# Patient Record
Sex: Female | Born: 1950 | Race: White | Hispanic: No | Smoking: Never smoker
Health system: Southern US, Community
[De-identification: ages and names within clinical notes are randomized; demographics above are authoritative.]

## PROBLEM LIST (undated history)

## (undated) DIAGNOSIS — K219 Gastro-esophageal reflux disease without esophagitis: Secondary | ICD-10-CM

## (undated) DIAGNOSIS — E663 Overweight: Secondary | ICD-10-CM

## (undated) DIAGNOSIS — R32 Unspecified urinary incontinence: Secondary | ICD-10-CM

## (undated) DIAGNOSIS — M1990 Unspecified osteoarthritis, unspecified site: Secondary | ICD-10-CM

## (undated) DIAGNOSIS — R0989 Other specified symptoms and signs involving the circulatory and respiratory systems: Secondary | ICD-10-CM

## (undated) DIAGNOSIS — R0609 Other forms of dyspnea: Secondary | ICD-10-CM

## (undated) DIAGNOSIS — G4733 Obstructive sleep apnea (adult) (pediatric): Secondary | ICD-10-CM

## (undated) DIAGNOSIS — J309 Allergic rhinitis, unspecified: Secondary | ICD-10-CM

## (undated) DIAGNOSIS — J45909 Unspecified asthma, uncomplicated: Secondary | ICD-10-CM

## (undated) DIAGNOSIS — E785 Hyperlipidemia, unspecified: Secondary | ICD-10-CM

## (undated) HISTORY — PX: TUBAL LIGATION: SHX77

## (undated) HISTORY — DX: Allergic rhinitis, unspecified: J30.9

## (undated) HISTORY — DX: Hyperlipidemia, unspecified: E78.5

## (undated) HISTORY — DX: Gastro-esophageal reflux disease without esophagitis: K21.9

## (undated) HISTORY — DX: Other forms of dyspnea: R06.09

## (undated) HISTORY — DX: Unspecified asthma, uncomplicated: J45.909

## (undated) HISTORY — DX: Overweight: E66.3

## (undated) HISTORY — DX: Other specified symptoms and signs involving the circulatory and respiratory systems: R09.89

## (undated) HISTORY — DX: Obstructive sleep apnea (adult) (pediatric): G47.33

## (undated) HISTORY — PX: TOTAL KNEE ARTHROPLASTY: SHX125

## (undated) HISTORY — DX: Unspecified urinary incontinence: R32

## (undated) HISTORY — DX: Unspecified osteoarthritis, unspecified site: M19.90

---

## 2003-01-03 LAB — ABO/RH: ABO Rh Results: 0

## 2004-03-10 ENCOUNTER — Other Ambulatory Visit: Admission: RE | Admit: 2004-03-10 | Discharge: 2004-03-10 | Payer: Self-pay | Admitting: Obstetrics and Gynecology

## 2005-06-02 ENCOUNTER — Other Ambulatory Visit: Admission: RE | Admit: 2005-06-02 | Discharge: 2005-06-02 | Payer: Self-pay | Admitting: Obstetrics and Gynecology

## 2006-10-23 ENCOUNTER — Ambulatory Visit: Payer: Self-pay | Admitting: Pulmonary Disease

## 2006-11-14 HISTORY — PX: OTHER SURGICAL HISTORY: SHX169

## 2007-01-01 ENCOUNTER — Ambulatory Visit: Payer: Self-pay | Admitting: Pulmonary Disease

## 2007-01-23 ENCOUNTER — Ambulatory Visit: Payer: Self-pay | Admitting: Pulmonary Disease

## 2007-02-13 ENCOUNTER — Ambulatory Visit: Payer: Self-pay | Admitting: Pulmonary Disease

## 2007-07-13 ENCOUNTER — Inpatient Hospital Stay (HOSPITAL_COMMUNITY): Admission: EM | Admit: 2007-07-13 | Discharge: 2007-07-14 | Payer: Self-pay | Admitting: Emergency Medicine

## 2007-09-10 ENCOUNTER — Ambulatory Visit: Payer: Self-pay | Admitting: Pulmonary Disease

## 2007-09-17 ENCOUNTER — Ambulatory Visit (HOSPITAL_BASED_OUTPATIENT_CLINIC_OR_DEPARTMENT_OTHER): Admission: RE | Admit: 2007-09-17 | Discharge: 2007-09-17 | Payer: Self-pay | Admitting: Pulmonary Disease

## 2007-09-28 ENCOUNTER — Ambulatory Visit: Payer: Self-pay | Admitting: Pulmonary Disease

## 2007-10-15 DIAGNOSIS — J45909 Unspecified asthma, uncomplicated: Secondary | ICD-10-CM | POA: Insufficient documentation

## 2007-10-15 DIAGNOSIS — J4540 Moderate persistent asthma, uncomplicated: Secondary | ICD-10-CM | POA: Insufficient documentation

## 2007-10-15 DIAGNOSIS — R0989 Other specified symptoms and signs involving the circulatory and respiratory systems: Secondary | ICD-10-CM | POA: Insufficient documentation

## 2007-10-15 DIAGNOSIS — R0609 Other forms of dyspnea: Secondary | ICD-10-CM | POA: Insufficient documentation

## 2007-10-15 DIAGNOSIS — J4541 Moderate persistent asthma with (acute) exacerbation: Secondary | ICD-10-CM | POA: Insufficient documentation

## 2007-10-23 ENCOUNTER — Ambulatory Visit: Payer: Self-pay | Admitting: Pulmonary Disease

## 2007-10-23 DIAGNOSIS — G4733 Obstructive sleep apnea (adult) (pediatric): Secondary | ICD-10-CM | POA: Insufficient documentation

## 2008-08-25 ENCOUNTER — Encounter: Payer: Self-pay | Admitting: Pulmonary Disease

## 2008-10-27 ENCOUNTER — Ambulatory Visit: Payer: Self-pay | Admitting: Pulmonary Disease

## 2008-10-28 ENCOUNTER — Telehealth (INDEPENDENT_AMBULATORY_CARE_PROVIDER_SITE_OTHER): Payer: Self-pay | Admitting: *Deleted

## 2009-01-15 ENCOUNTER — Ambulatory Visit: Payer: Self-pay | Admitting: Internal Medicine

## 2009-02-18 ENCOUNTER — Ambulatory Visit: Payer: Self-pay | Admitting: Internal Medicine

## 2009-04-01 ENCOUNTER — Ambulatory Visit: Payer: Self-pay | Admitting: Internal Medicine

## 2009-04-14 ENCOUNTER — Ambulatory Visit: Payer: Self-pay | Admitting: Pulmonary Disease

## 2009-04-14 DIAGNOSIS — J309 Allergic rhinitis, unspecified: Secondary | ICD-10-CM | POA: Insufficient documentation

## 2009-04-14 DIAGNOSIS — K219 Gastro-esophageal reflux disease without esophagitis: Secondary | ICD-10-CM | POA: Insufficient documentation

## 2009-04-14 DIAGNOSIS — E785 Hyperlipidemia, unspecified: Secondary | ICD-10-CM | POA: Insufficient documentation

## 2009-04-14 DIAGNOSIS — R7309 Other abnormal glucose: Secondary | ICD-10-CM | POA: Insufficient documentation

## 2009-04-14 DIAGNOSIS — E1169 Type 2 diabetes mellitus with other specified complication: Secondary | ICD-10-CM | POA: Insufficient documentation

## 2009-04-15 DIAGNOSIS — R32 Unspecified urinary incontinence: Secondary | ICD-10-CM | POA: Insufficient documentation

## 2009-04-15 DIAGNOSIS — M1990 Unspecified osteoarthritis, unspecified site: Secondary | ICD-10-CM | POA: Insufficient documentation

## 2009-06-10 ENCOUNTER — Telehealth: Payer: Self-pay | Admitting: Pulmonary Disease

## 2009-06-30 ENCOUNTER — Telehealth: Payer: Self-pay | Admitting: Pulmonary Disease

## 2009-10-16 ENCOUNTER — Encounter: Admission: RE | Admit: 2009-10-16 | Discharge: 2009-10-16 | Payer: Self-pay | Admitting: Obstetrics and Gynecology

## 2010-01-11 ENCOUNTER — Ambulatory Visit: Payer: Self-pay | Admitting: Pulmonary Disease

## 2010-01-11 DIAGNOSIS — J019 Acute sinusitis, unspecified: Secondary | ICD-10-CM | POA: Insufficient documentation

## 2010-04-29 ENCOUNTER — Ambulatory Visit: Payer: Self-pay | Admitting: Pulmonary Disease

## 2010-05-01 DIAGNOSIS — E663 Overweight: Secondary | ICD-10-CM | POA: Insufficient documentation

## 2010-05-11 ENCOUNTER — Telehealth (INDEPENDENT_AMBULATORY_CARE_PROVIDER_SITE_OTHER): Payer: Self-pay | Admitting: *Deleted

## 2010-05-27 LAB — CONVERTED CEMR LAB
ALT: 25 units/L (ref 0–35)
AST: 19 units/L (ref 0–37)
Albumin: 4.1 g/dL (ref 3.5–5.2)
Alkaline Phosphatase: 58 units/L (ref 39–117)
Basophils Absolute: 0.1 10*3/uL (ref 0.0–0.1)
CO2: 28 meq/L (ref 19–32)
Chloride: 110 meq/L (ref 96–112)
Cholesterol: 228 mg/dL — ABNORMAL HIGH (ref 0–200)
Glucose, Bld: 119 mg/dL — ABNORMAL HIGH (ref 70–99)
HCT: 39.8 % (ref 36.0–46.0)
Lymphocytes Relative: 39.2 % (ref 12.0–46.0)
Lymphs Abs: 2.4 10*3/uL (ref 0.7–4.0)
Monocytes Relative: 7.5 % (ref 3.0–12.0)
Neutrophils Relative %: 47.1 % (ref 43.0–77.0)
Platelets: 281 10*3/uL (ref 150.0–400.0)
Potassium: 4.3 meq/L (ref 3.5–5.1)
RDW: 13.8 % (ref 11.5–14.6)
Sodium: 144 meq/L (ref 135–145)
Total Protein: 6.7 g/dL (ref 6.0–8.3)
VLDL: 43.2 mg/dL — ABNORMAL HIGH (ref 0.0–40.0)

## 2010-07-07 ENCOUNTER — Ambulatory Visit: Payer: Self-pay | Admitting: Pulmonary Disease

## 2010-07-07 DIAGNOSIS — J209 Acute bronchitis, unspecified: Secondary | ICD-10-CM | POA: Insufficient documentation

## 2010-07-12 ENCOUNTER — Telehealth (INDEPENDENT_AMBULATORY_CARE_PROVIDER_SITE_OTHER): Payer: Self-pay | Admitting: *Deleted

## 2010-12-08 ENCOUNTER — Encounter: Payer: Self-pay | Admitting: Pulmonary Disease

## 2010-12-12 LAB — CONVERTED CEMR LAB
AST: 25 units/L (ref 0–37)
Albumin: 4.2 g/dL (ref 3.5–5.2)
BUN: 7 mg/dL (ref 6–23)
Basophils Absolute: 0.1 10*3/uL (ref 0.0–0.1)
CO2: 30 meq/L (ref 19–32)
Calcium: 10.3 mg/dL (ref 8.4–10.5)
Creatinine, Ser: 0.7 mg/dL (ref 0.4–1.2)
Eosinophils Relative: 2.8 % (ref 0.0–5.0)
GFR calc non Af Amer: 90.82 mL/min (ref >60)
Glucose, Bld: 100 mg/dL — ABNORMAL HIGH (ref 70–99)
HDL: 43.2 mg/dL (ref >39.00)
Hgb A1c MFr Bld: 5.8 % (ref 4.6–6.5)
Lymphocytes Relative: 34.9 % (ref 12.0–46.0)
Lymphs Abs: 2.2 10*3/uL (ref 0.7–4.0)
MCV: 86.8 fL (ref 78.0–100.0)
Monocytes Absolute: 0.5 10*3/uL (ref 0.1–1.0)
Monocytes Relative: 7.8 % (ref 3.0–12.0)
Neutro Abs: 3.3 10*3/uL (ref 1.4–7.7)
Neutrophils Relative %: 53.6 % (ref 43.0–77.0)
Nitrite: NEGATIVE
Potassium: 4.1 meq/L (ref 3.5–5.1)
RBC: 4.89 M/uL (ref 3.87–5.11)
Sodium: 143 meq/L (ref 135–145)
Total Bilirubin: 1 mg/dL (ref 0.3–1.2)
Total Protein, Urine: 30 mg/dL
Total Protein: 7.4 g/dL (ref 6.0–8.3)
Triglycerides: 141 mg/dL (ref 0.0–149.0)
Urine Glucose: NEGATIVE mg/dL
Urobilinogen, UA: 0.2 (ref 0.0–1.0)
VLDL: 28.2 mg/dL (ref 0.0–40.0)

## 2010-12-16 ENCOUNTER — Ambulatory Visit (INDEPENDENT_AMBULATORY_CARE_PROVIDER_SITE_OTHER): Payer: BC Managed Care – PPO | Admitting: Adult Health

## 2010-12-16 ENCOUNTER — Encounter: Payer: Self-pay | Admitting: Adult Health

## 2010-12-16 DIAGNOSIS — J309 Allergic rhinitis, unspecified: Secondary | ICD-10-CM

## 2010-12-16 DIAGNOSIS — J209 Acute bronchitis, unspecified: Secondary | ICD-10-CM

## 2010-12-16 NOTE — Assessment & Plan Note (Signed)
Summary: Acute NP office visit - asthma   Copy to:  Kriste Basque Primary Provider/Referring Provider:  Kriste Basque  CC:  increased SOB, wheezing, prod cough with yellow mucus, sinus pressure/congestion with brownish drainage, and PND x2weeks - denies f/c/s.  History of Present Illness: January 15, 2009--Presents for acute office viist. Complains of cough for 3 days (clear)  -Started after working out - [employer]- occas runny nose - occas chest tightness. Allergy symtpoms have been worse over last 6 months. Now has daily watery eyes despite using zyrtec once daily. Denies chest pain, dyspnea, orthopnea, hemoptysis, fever, n/v/d, edema, purulent sputum. Wants an allergy referral . Prev. tested >10 years ago. Neighbor"s cat bothers her breathing. New house on slab, gas heat, small dog, encasings on bedding, not dusty or moldy, husband smokes outside.  02/18/09-NEW ALLERGY CONSULT for Dr Nadel/ NP Parett for this 60 yoF. She c/o daily perennial watering and itching eyes watery nose, mild wheezing. Ears itch. Wheezing started in past year. Hives in past treated with prednisone. denies problems with foods, insects or cosmetics, latex, contrast or aspirin. Previous allergy testing by neighbor Dr Willa Rough, without vaccine suggested. No ENT surgery. Hx sleep apnea- trying to lose weight before CPAP.  04/01/09- Allergic rhinitis, hx OSA Right frontal HA off antihistamine for skin testing today.  Skin test- strong positive  grass, weed, tree, dust/ mite  January 11, 2010--Presents for an acute office visit. Complains of increased SOB, wheezing, prod cough with yellow mucus, sinus pressure/congestion with brownish drainage, PND x2weeks, .OTC not helping. Denies chest pain, dyspnea, orthopnea, hemoptysis, fever, n/v/d, edema, headache.   Medications Prior to Update: 1)  Zyrtec Allergy 10 Mg  Tabs (Cetirizine Hcl) .... Use As Needed 2)  Astepro 0.15 % Soln (Azelastine Hcl) .... 2 Puffs At Bedtime 3)  Mucinex Dm 30-600 Mg  Xr12h-Tab (Dextromethorphan-Guaifenesin) .Marland Kitchen.. 1-2 Every 12 Hours As Needed For Cough 4)  Symbicort 80-4.5 Mcg/act Aero (Budesonide-Formoterol Fumarate) .... Inhale 2 Puffs Two Times A Day 5)  Tylenol Arthritis Pain 650 Mg  Tbcr (Acetaminophen) .... Take By Mouth As Needed 6)  Multivitamins   Tabs (Multiple Vitamin) .... Take 1 Tablet By Mouth Once A Day 7)  Aspirin 81 Mg Tbec (Aspirin) .... Take 1 Tab By Mouth Once Daily.Marland KitchenMarland Kitchen 8)  Vitamin D 1000 Unit Tabs (Cholecalciferol) .... Take 1 Tablet By Mouth Once A Day  Current Medications (verified): 1)  Zyrtec Allergy 10 Mg  Tabs (Cetirizine Hcl) .... Use As Needed 2)  Astepro 0.15 % Soln (Azelastine Hcl) .... 2 Puffs At Bedtime 3)  Mucinex Dm 30-600 Mg Xr12h-Tab (Dextromethorphan-Guaifenesin) .Marland Kitchen.. 1-2 Every 12 Hours As Needed For Cough 4)  Symbicort 80-4.5 Mcg/act Aero (Budesonide-Formoterol Fumarate) .... Inhale 2 Puffs Two Times A Day 5)  Tylenol Arthritis Pain 650 Mg  Tbcr (Acetaminophen) .... Take By Mouth As Needed 6)  Multivitamins   Tabs (Multiple Vitamin) .... Take 1 Tablet By Mouth Once A Day 7)  Aspirin 81 Mg Tbec (Aspirin) .... Take 1 Tab By Mouth Once Daily.Marland KitchenMarland Kitchen 8)  Vitamin D 1000 Unit Tabs (Cholecalciferol) .... Take 1 Tablet By Mouth Once A Day 9)  Citracal Plus  Tabs (Multiple Minerals-Vitamins) .... Take 1 Tablet By Mouth Once A Day  Allergies (verified): No Known Drug Allergies  Past History:  Past Medical History: Last updated: 04/14/2009 ALLERGIC RHINITIS (ICD-477.9) SLEEP APNEA, OBSTRUCTIVE, MODERATE (ICD-327.23) SNORING (ICD-786.09) ASTHMA (ICD-493.90) HYPERCHOLESTEROLEMIA, BORDERLINE (ICD-272.4) OTHER ABNORMAL GLUCOSE (ICD-790.29) GASTROESOPHAGEAL REFLUX DISEASE (ICD-530.81) URINARY INCONTINENCE (ICD-788.30) DEGENERATIVE JOINT DISEASE (ICD-715.90)  Past Surgical History: Last updated: 04/19/2009 Partial knee replacement Fracture wrist w/ surg DrMyerdierks 8/08 Tubal ligation  Family History: Last updated:  2009/04/19 Father died age 57 w/ DM  & heart disease. Mother alive age 3 in nursing home w/ stroke, arthritis. 2 Siblings: 1 Brother in good health 1 Sister in good health  Social History: Last updated: 2009/04/19 Married, husb= Casimiro Needle, 37 yrs 2 children in good health never smoked social alcohol Work [employer], buisness owner--tags/reg.   Risk Factors: Smoking Status: never (02/18/2009)  Review of Systems      See HPI  Vital Signs:  Patient profile:   60 year old female Height:      67 inches Weight:      209.50 pounds BMI:     32.93 O2 Sat:      94 % on Room air Temp:     99.0 degrees F oral Pulse rate:   68 / minute BP sitting:   124 / 76  (left arm) Cuff size:   regular  Vitals Entered By: Boone Master CNA (January 11, 2010 4:26 PM)  O2 Flow:  Room air CC: increased SOB, wheezing, prod cough with yellow mucus, sinus pressure/congestion with brownish drainage, PND x2weeks - denies f/c/s Is Patient Diabetic? No Comments Medications reviewed with patient Daytime contact number verified with patient. Boone Master CNA  January 11, 2010 4:26 PM    Physical Exam  Additional Exam:  General: A/Ox3; pleasant and cooperative, NAD, stocky build SKIN: no rash, lesions NODES: no lymphadenopathy HEENT: [place]/AT, EOM- WNL, Conjuctivae- clear, PERRLA, TM-WNL, Nose- clear, edematous, Throat- clear and wnl, Melampatti III-IV NECK: Supple w/ fair ROM, JVD- none, normal carotid impulses w/o bruits Thyroid- normal to palpation CHEST: Clear to P&A HEART: RRR, no m/g/r heard ABDOMEN BTD:VVOH, nl pulses, no edema  NEURO: Grossly intact to observation      Impression & Recommendations:  Problem # 1:  SINUSITIS, ACUTE (ICD-461.9)  Omnicef 300mg  two times a day for 10 days Mucinex DM two times a day as needed cough /congestion  Increase fluids  Saline nasal rinses as needed  Please contact office for sooner follow up if symptoms do not improve or worsen  follow up Dr. Kriste Basque  for CPX in 3 months  Her updated medication list for this problem includes:    Astepro 0.15 % Soln (Azelastine hcl) .Marland Kitchen... 2 puffs at bedtime    Mucinex Dm 30-600 Mg Xr12h-tab (Dextromethorphan-guaifenesin) .Marland Kitchen... 1-2 every 12 hours as needed for cough    Cefdinir 300 Mg Caps (Cefdinir) .Marland Kitchen... 1 by mouth two times a day  Orders: Est. Patient Level IV (60737)  Medications Added to Medication List This Visit: 1)  Citracal Plus Tabs (Multiple minerals-vitamins) .... Take 1 tablet by mouth once a day 2)  Cefdinir 300 Mg Caps (Cefdinir) .Marland Kitchen.. 1 by mouth two times a day  Complete Medication List: 1)  Zyrtec Allergy 10 Mg Tabs (Cetirizine hcl) .... Use as needed 2)  Astepro 0.15 % Soln (Azelastine hcl) .... 2 puffs at bedtime 3)  Mucinex Dm 30-600 Mg Xr12h-tab (Dextromethorphan-guaifenesin) .Marland Kitchen.. 1-2 every 12 hours as needed for cough 4)  Symbicort 80-4.5 Mcg/act Aero (Budesonide-formoterol fumarate) .... Inhale 2 puffs two times a day 5)  Tylenol Arthritis Pain 650 Mg Tbcr (Acetaminophen) .... Take by mouth as needed 6)  Multivitamins Tabs (Multiple vitamin) .... Take 1 tablet by mouth once a day 7)  Aspirin 81 Mg Tbec (Aspirin) .... Take 1 tab by mouth once daily.Marland KitchenMarland Kitchen 8)  Vitamin  D 1000 Unit Tabs (Cholecalciferol) .... Take 1 tablet by mouth once a day 9)  Citracal Plus Tabs (Multiple minerals-vitamins) .... Take 1 tablet by mouth once a day 10)  Cefdinir 300 Mg Caps (Cefdinir) .Marland Kitchen.. 1 by mouth two times a day  Patient Instructions: 1)  Omnicef 300mg  two times a day for 10 days 2)  Mucinex DM two times a day as needed cough /congestion  3)  Increase fluids  4)  Saline nasal rinses as needed  5)  Please contact office for sooner follow up if symptoms do not improve or worsen  6)  follow up Dr. Kriste Basque for CPX in 3 months  Prescriptions: CEFDINIR 300 MG CAPS (CEFDINIR) 1 by mouth two times a day  #20 x 0   Entered and Authorized by:   Rubye Oaks NP   Signed by:   Rubye Oaks NP on 01/11/2010    Method used:   Electronically to        [id number]* (retail)       [address] [place], [place]  [address]       Ph: [phone]       Fax: [phone]   RxID:   [id number]    Immunization History:  Influenza Immunization History:    Influenza:  historical (08/14/2009)   Appended Document: neb documentation    Clinical Lists Changes  Orders: Added new Service order of Nebulizer Tx ([id number]) - Signed

## 2010-12-16 NOTE — Progress Notes (Signed)
Summary: still sick -LMTCB x 1  Phone Note Call from Patient   Caller: Patient Call For: tammy parrett Summary of Call: pt saw tp on 8/24. she is following all recs but does not feel any better. cell [phone] Initial call taken by: Tivis Ringer, CNA,  July 12, 2010 9:13 AM  Follow-up for Phone Call        pt states she is no better from ov still coughing-prod-yellow and very thick, throat sore and glands in neck are swollen very fatigued, no energy, pt denies fever, chills and  sweats. pt states she is usually better after 3-4 days on antibiotic but dos not feel that is the case this time--pls advise Follow-up by: Philipp Deputy CMA,  July 12, 2010 10:13 AM  Additional Follow-up for Phone Call Additional follow up Details #1::        probably needs more time.  saline nasal rinses.  may have cough syrup --Hydromet 1-2 tsp every 4-6 hr as needed cough-may make you sleepy.  Prednisone taper over next week 10mg  4 tabs for 2 days, then 3 tabs for 2 days, 2 tabs for 2 days, then 1 tab for 2 days, then stop #20 no refills Please contact office for sooner follow up if symptoms do not improve or worsen   Additional Follow-up by: Rubye Oaks NP,  July 12, 2010 10:18 AM    Additional Follow-up for Phone Call Additional follow up Details #2::    LMOMTCB Vernie Murders  July 12, 2010 10:30 AM  Spoke with pt and notified of the above recs per TP.  Pt verbalized understandind and denied any questions.  Rxs sent to [facility] pharm per pt request. Follow-up by: Vernie Murders,  July 12, 2010 1:04 PM  New/Updated Medications: HYDROMET 5-1.5 MG/5ML SYRP (HYDROCODONE-HOMATROPINE) 1 to 2 tsp every 4-6 hours as needed PREDNISONE 10 MG TABS (PREDNISONE) 4 each am x 2 days, 3 each am x 2 days, 2 each am x 2 days, 1 each am x 2 days, then stop Prescriptions: PREDNISONE 10 MG TABS (PREDNISONE) 4 each am x 2 days, 3 each am x 2 days, 2 each am x 2 days, 1 each am x 2 days, then stop  #20 x 0  Entered by:   Vernie Murders   Authorized by:   Rubye Oaks NP   Signed by:   Vernie Murders on 07/12/2010   Method used:   Telephoned to ...       [facility] [phone]* (retail)       [address] [place], [place]  [address]       Ph: [phone]       Fax: [phone]   RxID:   [id number] HYDROMET 5-1.5 MG/5ML SYRP (HYDROCODONE-HOMATROPINE) 1 to 2 tsp every 4-6 hours as needed  #4 oz x 0   Entered by:   Vernie Murders   Authorized by:   Rubye Oaks NP   Signed by:   Vernie Murders on 07/12/2010   Method used:   Telephoned to ...       [facility] [phone]* (retail)       [address] [place], [place]  [address]       Ph: [phone]       Fax: [phone]   RxID:   [id number]

## 2010-12-16 NOTE — Assessment & Plan Note (Signed)
Summary: Acute NP office visit - bronchitis   Copy to:  Kriste Basque Primary Provider/Referring Provider:  Kriste Basque  CC:  prod cough with thick yellow mucus, sore throat, PND w/ hoarseness x1.5weeks - denies f/c/s, SOB, and wheezing.  History of Present Illness: January 15, 2009--Presents for acute office viist. Complains of cough for 3 days (clear)  -Started after working out - [employer]- occas runny nose - occas chest tightness. Allergy symtpoms have been worse over last 6 months. Now has daily watery eyes despite using zyrtec once daily.   Prev. tested >10 years ago. Neighbor"s cat bothers her breathing. New house on slab, gas heat, small dog, encasings on bedding, not dusty or moldy, husband smokes outside.  02/18/09-NEW ALLERGY CONSULT for Dr Nadel/ NP Parett for this 83 yoF. She c/o daily perennial watering and itching eyes watery nose, mild wheezing. Ears itch. Wheezing started in past year. Hives in past treated with prednisone. denies problems with foods, insects or cosmetics, latex, contrast or aspirin. Previous allergy testing by neighbor Dr Willa Rough, without vaccine suggested. No ENT surgery. Hx sleep apnea- trying to lose weight before CPAP.  04/01/09- Allergic rhinitis, hx OSA Right frontal HA off antihistamine for skin testing today. Skin test- strong positive  grass, weed, tree, dust/ mite  January 11, 2010--Presents for an acute office visit. Complains of increased SOB, wheezing, prod cough with yellow mucus, sinus pressure/congestion with brownish drainage, PND x2weeks, .OTC not helping.    ~  April 29, 2010:  she notes intermittent chest tightness esp if she doesn't take her Zyrtek/ Mucinex> we discussed her asthma & the need for Symbicort... she's only had one infect exac- 2/11 Rx w/ Omnicef... she has ?overactive bladder symtoms & we decided to try Toviaz Rx, DrToblin has been considering sling surg, she says... she has gained 10# in the last yr & we discussed diet + exercise  needed...  July 07, 2010--Presents for an acute office visit. Complains of prod cough with thick yellow mucus, sore throat, PND w/ hoarseness x1.5weeks. OTC not helping. Cough is getting worse with very thick mucus. Coughing up lots of mucus. Initially started with drainage, sore throat, hoarseness. Leaving to vacation to [place] next week. Denies chest pain, dyspnea, orthopnea, hemoptysis, fever, n/v/d, edema, headache,recent travel or antibiotifcs.   Medications Prior to Update: 1)  Zyrtec Allergy 10 Mg  Tabs (Cetirizine Hcl) .... Use As Needed 2)  Astepro 0.15 % Soln (Azelastine Hcl) .... 2 Puffs At Bedtime 3)  Mucinex Dm 30-600 Mg Xr12h-Tab (Dextromethorphan-Guaifenesin) .Marland Kitchen.. 1-2 Every 12 Hours As Needed For Cough 4)  Symbicort 80-4.5 Mcg/act Aero (Budesonide-Formoterol Fumarate) .... Inhale 2 Puffs Two Times A Day 5)  Aspirin 81 Mg Tbec (Aspirin) .... Take 1 Tab By Mouth Once Daily.Marland KitchenMarland Kitchen 6)  Multivitamins   Tabs (Multiple Vitamin) .... Take 1 Tablet By Mouth Once A Day 7)  Vitamin D 1000 Unit Tabs (Cholecalciferol) .... Take 1 Tablet By Mouth Once A Day 8)  Citracal Plus  Tabs (Multiple Minerals-Vitamins) .... Take 1 Tablet By Mouth Once A Day 9)  Toviaz 8 Mg Xr24h-Tab (Fesoterodine Fumarate) .... Take 1 Tab By Mouth Once Daily For Overactive Bladder Symptoms... 10)  Cefdinir 300 Mg Caps (Cefdinir) .... Take 2 Capsules By Mouth Once Daily X 7 Days  Current Medications (verified): 1)  Zyrtec Allergy 10 Mg  Tabs (Cetirizine Hcl) .... Use As Needed 2)  Astepro 0.15 % Soln (Azelastine Hcl) .... 2 Puffs At Bedtime 3)  Mucinex Dm 30-600 Mg Xr12h-Tab (Dextromethorphan-Guaifenesin) .Marland KitchenMarland KitchenMarland Kitchen  1-2 Every 12 Hours As Needed For Cough 4)  Symbicort 80-4.5 Mcg/act Aero (Budesonide-Formoterol Fumarate) .... Inhale 2 Puffs Two Times A Day 5)  Aspirin 81 Mg Tbec (Aspirin) .... Take 1 Tab By Mouth Once Daily.Marland KitchenMarland Kitchen 6)  Multivitamins   Tabs (Multiple Vitamin) .... Take 1 Tablet By Mouth Once A Day 7)  Vitamin D 1000  Unit Tabs (Cholecalciferol) .... Take 1 Tablet By Mouth Once A Day 8)  Citracal Plus  Tabs (Multiple Minerals-Vitamins) .... Take 1 Tablet By Mouth Once A Day 9)  Toviaz 8 Mg Xr24h-Tab (Fesoterodine Fumarate) .... Take 1 Tab By Mouth Once Daily For Overactive Bladder Symptoms...  Allergies (verified): 1)  ! Augmentin  Past History:  Past Medical History: Last updated: 2010/05/08 ALLERGIC RHINITIS (ICD-477.9) SLEEP APNEA, OBSTRUCTIVE, MODERATE (ICD-327.23) SNORING (ICD-786.09) ASTHMA (ICD-493.90) HYPERCHOLESTEROLEMIA, BORDERLINE (ICD-272.4) OTHER ABNORMAL GLUCOSE (ICD-790.29) OVERWEIGHT (ICD-278.02) GASTROESOPHAGEAL REFLUX DISEASE (ICD-530.81) URINARY INCONTINENCE (ICD-788.30) DEGENERATIVE JOINT DISEASE (ICD-715.90)  Past Surgical History: Last updated: May 08, 2010 Partial knee replacement Fracture wrist w/ surg DrMyerdierks 8/08 Tubal ligation  Family History: Last updated: 2010/05/08 Father died age 38 w/ DM  & heart disease. Mother alive age 42 in nursing home w/ stroke, arthritis. 2 Siblings: 1 Brother in good health 1 Sister in good health  Social History: Last updated: 04/14/2009 Married, husb= Casimiro Needle, 37 yrs 2 children in good health never smoked social alcohol Work [employer], buisness owner--tags/reg.   Risk Factors: Smoking Status: never (02/18/2009)  Review of Systems      See HPI  Vital Signs:  Patient profile:   60 year old female Height:      67 inches Weight:      203.25 pounds BMI:     31.95 O2 Sat:      98 % on Room air Temp:     98.1 degrees F oral Pulse rate:   63 / minute BP sitting:   118 / 76  (left arm) Cuff size:   large  Vitals Entered By: Boone Master CNA/MA (July 07, 2010 9:56 AM)  O2 Flow:  Room air CC: prod cough with thick yellow mucus, sore throat, PND w/ hoarseness x1.5weeks - denies f/c/s, SOB, wheezing Is Patient Diabetic? No Comments Medications reviewed with patient Daytime contact number verified with patient. Boone Master CNA/MA  July 07, 2010 9:55 AM    Physical Exam  Additional Exam:  General: A/Ox3; pleasant and cooperative, NAD SKIN: no rash, lesions NODES: no lymphadenopathy HEENT: [place]/AT, EOM- WNL, Conjuctivae- clear, PERRLA, TM-WNL, Nose- clear, edematous, Throat- clear and wnl, Melampatti III-IV NECK: Supple w/ fair ROM, JVD- none, normal carotid impulses w/o bruits Thyroid- normal to palpation CHEST: Clear to P&A HEART: RRR, no m/g/r heard ABDOMEN ZOX:WRUE, nl pulses, no edema  NEURO: Grossly intact to observation      Impression & Recommendations:  Problem # 1:  BRONCHITIS, ACUTE (ICD-466.0)  Omnicef 300mg  two times a day for 7days Mucinex DM two times a day as needed cough/congestion Increase fluids.  Saline nasal rinses as needed  Please contact office for sooner follow up if symptoms do not improve or worsen  The following medications were removed from the medication list:    Cefdinir 300 Mg Caps (Cefdinir) .Marland Kitchen... Take 2 capsules by mouth once daily x 7 days Her updated medication list for this problem includes:    Mucinex Dm 30-600 Mg Xr12h-tab (Dextromethorphan-guaifenesin) .Marland Kitchen... 1-2 every 12 hours as needed for cough    Symbicort 80-4.5 Mcg/act Aero (Budesonide-formoterol fumarate) ..... Inhale 2 puffs two times  a day    Cefdinir 300 Mg Caps (Cefdinir) .Marland Kitchen... 1 by mouth two times a day  Orders: Est. Patient Level III ([address])  Medications Added to Medication List This Visit: 1)  Cefdinir 300 Mg Caps (Cefdinir) .Marland Kitchen.. 1 by mouth two times a day  Complete Medication List: 1)  Zyrtec Allergy 10 Mg Tabs (Cetirizine hcl) .... Use as needed 2)  Astepro 0.15 % Soln (Azelastine hcl) .... 2 puffs at bedtime 3)  Mucinex Dm 30-600 Mg Xr12h-tab (Dextromethorphan-guaifenesin) .Marland Kitchen.. 1-2 every 12 hours as needed for cough 4)  Symbicort 80-4.5 Mcg/act Aero (Budesonide-formoterol fumarate) .... Inhale 2 puffs two times a day 5)  Aspirin 81 Mg Tbec (Aspirin) .... Take 1 tab by mouth once  daily.Marland KitchenMarland Kitchen 6)  Multivitamins Tabs (Multiple vitamin) .... Take 1 tablet by mouth once a day 7)  Vitamin D 1000 Unit Tabs (Cholecalciferol) .... Take 1 tablet by mouth once a day 8)  Citracal Plus Tabs (Multiple minerals-vitamins) .... Take 1 tablet by mouth once a day 9)  Toviaz 8 Mg Xr24h-tab (Fesoterodine fumarate) .... Take 1 tab by mouth once daily for overactive bladder symptoms... 10)  Cefdinir 300 Mg Caps (Cefdinir) .Marland Kitchen.. 1 by mouth two times a day  Patient Instructions: 1)  Omnicef 300mg  two times a day for 7days 2)  Mucinex DM two times a day as needed cough/congestion 3)  Increase fluids.  4)  Saline nasal rinses as needed  5)  Please contact office for sooner follow up if symptoms do not improve or worsen  Prescriptions: CEFDINIR 300 MG CAPS (CEFDINIR) 1 by mouth two times a day  #14 x 0   Entered and Authorized by:   Rubye Oaks NP   Signed by:   Rubye Oaks NP on 07/07/2010   Method used:   Electronically to        [id number] (retail)       [address] [place], [place]  [address]       Ph: [phone]       Fax: [phone]   RxID:   [id number]

## 2010-12-16 NOTE — Assessment & Plan Note (Signed)
Summary: cpx and 65m reck/klw   Primary Care Provider:  Kriste Basque  CC:  Yearly ROV & CPX....  History of Present Illness: 60 y/o WF here for a follow up visit and CPX... she is the daughter of Carrie Perkins, a former pt of ours, and we see her for general med purposes...   ~  Jun10:  last seen by me 4/08 for an AB exac treated w/ Avelox & Dosepak in addition to her Symbicort, Mucinex, Protonix...  she is also followed by DrClance for Sleep- & DrYoung for Allergy...   ~  April 29, 2010:  she notes intermittent chest tightness esp if she doesn't take her Zyrtek/ Mucinex> we discussed her asthma & the need for Symbicort... she's only had one infect exac- 2/11 Rx w/ Omnicef... she has ?overactive bladder symtoms & we decided to try Toviaz Rx, DrToblin has been considering sling surg, she says... she has gained 10# in the last yr & we discussed diet + exercise needed...   Current Problems:   PHYSICAL EXAMINATION (ICD-V70.0) - her GYN= DrTomblin for PAP, Mammograms, & -hasn't had BMD yet and she will discuss w/ him... on ASA 81mg /d, MVI & Vit D 1000 u daily.  ALLERGIC RHINITIS (ICD-477.9) - states that she had allergy eval by DrYoung but can't find this in old paper chart... she decided against allergy shots and uses Zyrtek, Mucinex, Astepro Prn...  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 she was given option of weight loss, oral appliance, CPAP and she chose weight reduction- but weight has been betw 195 - 198 x several yrs, now up to 204# but she denies any worsening sleep problems, daytime hypersom symptoms, snoring, etc...  ASTHMA (ICD-493.90) - she has RADS and is treated w/ SYMBICORT 80 2 spBid, MUCINEX Bid, etc... she denies recent infectious exac...   ~  baseline CXR= clear, WNL.Marland Kitchen.  ~  PFT 3/08 showed FVC= 2.72 (80%), FEV1= 2.17 (79%), %1sec= 80, mid-flows= 77% pred...  ~  f/u CXR  6/11 is clear, WNL...  HYPERCHOLESTEROLEMIA, BORDERLINE (ICD-272.4) - labs from DrTomblin's office 2006 showed TChol 194, TG 167, HDL 53, LDL 108... he rec "f/u w/ PCP"  ~  FLP 6/10 on diet alone showed TChol 164, TG 141, HDL 43, LDL 93  ~  FLP 6/11 on diet alone showed TChol   OTHER ABNORMAL GLUCOSE (ICD-790.29) - prev labs are from DrToblin's office 2006 showed FBS= 110, A1c= 5.4, and rec for pt to "f/u w/ PCP"  ~  labs 6/10 showed BS= 100, A1c= 5.8  ~  labs 6/11 showed BS=   OVERWEIGHT (ICD-278.02) - we discussed diet + exercise   ~  weight 6/10 = 195#  ~  weight 6/11 = 204#  GASTROESOPHAGEAL REFLUX DISEASE (ICD-530.81) - hx of GERD symptoms prev treated w/ Protonix.Marland Kitchen. now uses OTC PRILOSEC Prn only & denies heartburn etc...  COLONOSCOPY - she states that DrTomblin sent her to [facility] for routine colonoscopy  ~2006 and pt reports that this procedure was neg.  URINARY INCONTINENCE (ICD-788.30) - she notes that DrTomblin knows all about this and they are working on it> considering sling surgery... but some of her symptoms suggest overactive bladder & we decided to try TOVIAZ 8mg  as trial...  DEGENERATIVE JOINT DISEASE (ICD-715.90) - she had knee surgery in the past & a fractured wrist but denies any current ortho problems...   Current Medications (verified):  1)  Zyrtec Allergy 10 Mg  Tabs (Cetirizine Hcl) .... Use As Needed 2)  Astepro 0.15 % Soln (Azelastine Hcl) .... 2 Puffs At Bedtime 3)  Mucinex Dm 30-600 Mg Xr12h-Tab (Dextromethorphan-Guaifenesin) .Marland Kitchen.. 1-2 Every 12 Hours As Needed For Cough 4)  Symbicort 80-4.5 Mcg/act Aero (Budesonide-Formoterol Fumarate) .... Inhale 2 Puffs Two Times A Day 5)  Tylenol Arthritis Pain 650 Mg  Tbcr (Acetaminophen) .... Take By Mouth As Needed 6)  Multivitamins   Tabs (Multiple Vitamin) .... Take 1 Tablet By Mouth Once A Day 7)  Aspirin 81 Mg Tbec (Aspirin) .... Take 1 Tab By Mouth Once Daily.Marland KitchenMarland Kitchen 8)  Vitamin D 1000 Unit Tabs (Cholecalciferol) ....  Take 1 Tablet By Mouth Once A Day 9)  Citracal Plus  Tabs (Multiple Minerals-Vitamins) .... 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 Father died age 63 w/ DM  & heart disease. Mother alive age 2 in nursing home w/ stroke, arthritis. 2 Siblings: 1 Brother in good health 1 Sister in good health  Social History: Reviewed history from 04/14/2009 and no changes required. Married, husb= Casimiro Needle, 37 yrs 2 children in good health never smoked social alcohol Work [employer], buisness owner--tags/reg.   Review of Systems       The patient complains of fatigue, dyspnea on exertion, urinary frequency, incontinence, arthritis, and hay fever.  The patient denies fever, chills, sweats, anorexia, weakness, malaise, weight loss, sleep disorder, blurring, diplopia, eye irritation, eye discharge, vision loss, eye pain, photophobia, earache, ear discharge, tinnitus, decreased hearing, nasal congestion, nosebleeds, sore throat, hoarseness, chest pain, palpitations, syncope, orthopnea, PND, peripheral edema, cough, dyspnea at rest, excessive sputum, hemoptysis, wheezing, pleurisy, nausea, vomiting, diarrhea, constipation, change in bowel habits, abdominal pain, melena, hematochezia, jaundice, gas/bloating, indigestion/heartburn, dysphagia, odynophagia, dysuria, hematuria, urinary hesitancy, nocturia, back pain,  joint pain, joint swelling, muscle cramps, muscle weakness, stiffness, sciatica, restless legs, leg pain at night, leg pain with exertion, rash, itching, dryness, suspicious lesions, paralysis, paresthesias, seizures, tremors, vertigo, transient blindness, frequent falls, frequent headaches, difficulty walking, depression, anxiety, memory loss, confusion, cold intolerance, heat intolerance, polydipsia, polyphagia, polyuria, unusual weight change, abnormal bruising, bleeding, enlarged lymph nodes, urticaria, allergic rash, and recurrent infections.    Vital Signs:  Patient profile:   60 year old female Height:      67 inches Weight:      204 pounds O2 Sat:      98 % on Room air Temp:     97.1 degrees F oral Pulse rate:   71 / minute BP sitting:   110 / 60  (left arm)  Vitals Entered By: Denna Haggard, CMA (April 29, 2010 9:16 AM)  O2 Sat at Rest %:  98% O2 Flow:  Room air  Physical Exam  Additional Exam:  WD, sl overweight, 60 y/o WF in NAD...  GENERAL:  Alert & oriented; pleasant & cooperative... HEENT:  [place]/AT, EOM-wnl, PERRLA, Fundi-benign, EACs-clear, TMs-wnl, NOSE-clear, THROAT-clear & wnl. NECK:  Supple w/ full ROM; no JVD; normal carotid impulses w/o bruits; no thyromegaly or nodules palpated; no lymphadenopathy. CHEST:  Clear to  P & A; without wheezes/ rales/ or rhonchi. HEART:  Regular Rhythm; without murmurs/ rubs/ or gallops. ABDOMEN:  Soft & nontender; normal bowel sounds; no organomegaly or masses detected. EXT: without deformities, mild arthritic changes; no varicose veins/ venous insuffic/ or edema. NEURO:  CN's intact; motor testing normal; sensory testing normal; gait normal & balance OK. DERM:  No lesions noted; no rash etc...    CXR  Procedure date:  04/29/2010  Findings:      CHEST - 2 VIEW Comparison: 04/14/2009.   Findings:  The heart size and mediastinal contours are within normal limits.  Both lungs are clear.  The visualized skeletal structures are  unremarkable.   IMPRESSION: No active cardiopulmonary disease.   Read By:  Jonne Ply,  M.D.   EKG  Procedure date:  04/29/2010  Findings:      Normal sinus rhythm with rate of:  76/min... Tracing is essentially WNL, min NSSTTWA...  SN   MISC. Report  Procedure date:  04/29/2010  Findings:      SHE WILL RETURN FOR FASTING LABS>>>  SN   Impression & Recommendations:  Problem # 1:  PHYSICAL EXAMINATION (ICD-V70.0)  Orders: 12 Lead EKG (12 Lead EKG) T-2 View CXR (71020TC) TLB-BMP (Basic Metabolic Panel-BMET) (80048-METABOL) TLB-Hepatic/Liver Function Pnl (80076-HEPATIC) TLB-CBC Platelet - w/Differential (85025-CBCD) TLB-Lipid Panel (80061-LIPID) TLB-TSH (Thyroid Stimulating Hormone) (84443-TSH)  Problem # 2:  URINARY INCONTINENCE (ICD-788.30) Try TOVIAZ Rx... she will f/u w/ DrTomblin...  Problem # 3:  ASTHMA (ICD-493.90) Stable on meds>  continue same.. Her updated medication list for this problem includes:    Symbicort 80-4.5 Mcg/act Aero (Budesonide-formoterol fumarate) ..... Inhale 2 puffs two times a day  Problem # 4:  SLEEP APNEA, OBSTRUCTIVE, MODERATE (ICD-327.23) States that she is doing OK w/o sleep related problems now...  Problem # 5:  HYPERCHOLESTEROLEMIA, BORDERLINE (ICD-272.4) Diet + exercise dicussed & she will ret for FLP...  Problem # 6:  GASTROESOPHAGEAL REFLUX DISEASE (ICD-530.81) GI is stable and up to date...  Complete Medication List: 1)  Zyrtec Allergy 10 Mg Tabs (Cetirizine hcl) .... Use as needed 2)  Astepro 0.15 % Soln (Azelastine hcl) .... 2 puffs at bedtime 3)  Mucinex Dm 30-600 Mg Xr12h-tab (Dextromethorphan-guaifenesin) .Marland Kitchen.. 1-2 every 12 hours as needed for cough 4)  Symbicort 80-4.5 Mcg/act Aero (Budesonide-formoterol fumarate) .... Inhale 2 puffs two times a day 5)  Aspirin 81 Mg Tbec (Aspirin) .... Take 1 tab by mouth once daily.Marland KitchenMarland Kitchen 6)  Multivitamins Tabs (Multiple vitamin) .... Take 1 tablet by mouth once a day 7)   Vitamin D 1000 Unit Tabs (Cholecalciferol) .... Take 1 tablet by mouth once a day 8)  Citracal Plus Tabs (Multiple minerals-vitamins) .... Take 1 tablet by mouth once a day 9)  Toviaz 8 Mg Xr24h-tab (Fesoterodine fumarate) .... Take 1 tab by mouth once daily for overactive bladder symptoms...  Patient Instructions: 1)  Today we updated your med list- see below.... 2)  We wrote a new perscription for TOVIAZ to try once daily for the overactive bladder symptoms.Marland KitchenMarland Kitchen 3)  Today we did your f/u CXR, EKG, & FASTING labs... please call the "phone tree" in a few days for your lab results.Marland KitchenMarland Kitchen 4)  Let's get on track w/ our diet + exercise program...  5)  Call for any questions.Marland KitchenMarland Kitchen 6)  Please schedule a follow-up appointment in 1 year. Prescriptions: TOVIAZ 8 MG XR24H-TAB (FESOTERODINE FUMARATE) take 1 tab by mouth once daily for overactive bladder symptoms...  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 Tracing is essentially WNL, min NSSTTWA...  SN

## 2010-12-16 NOTE — Progress Notes (Signed)
Summary: congestion/abx called into pharmacy  Phone Note Outgoing Call   Summary of Call: called and spoke with pt about her fasting labs that had not been done.  she will come in this am for this and SN noted that she just had a cpx last week and had appt this am due to illness.  called and spoke with pt and she stated that she started having symptoms after her cpx last week of a head cold.  this went to her chest--c/o cough with yellow sputum and chills and fever last night.  per SN--ok to call in [facility] 300mg   generic is ok---#14  2 by mouth once daily x 7 days.  use the delsym otc and mucinex 1-2 tabs by mouth two times a day with plenty of fluids.  pt is aware of recs per SN and rx has been sent to her pharmacy per pts request.   New Allergies: ! AUGMENTIN New/Updated Medications: CEFDINIR 300 MG CAPS (CEFDINIR) take 2 capsules by mouth once daily x 7 days New Allergies: ! AUGMENTINPrescriptions: CEFDINIR 300 MG CAPS (CEFDINIR) take 2 capsules by mouth once daily x 7 days  #14 x 0   Entered by:   Randell Loop CMA   Authorized by:   Michele Mcalpine MD   Signed by:   Randell Loop CMA on 05/11/2010   Method used:   Electronically to        [facility]. [phone]* (retail)       [address]       [place], [place]  [address]       Ph: [phone]       Fax: [phone]   RxID:   [id number]

## 2010-12-29 ENCOUNTER — Telehealth: Payer: Self-pay | Admitting: Pulmonary Disease

## 2010-12-30 NOTE — Assessment & Plan Note (Signed)
Summary: Acute NP office visit - URI   Vital Signs:  Patient profile:   60 year old female Height:      67 inches Weight:      215.38 pounds BMI:     33.86 O2 Sat:      94 % on Room air Temp:     97.8 degrees F oral Pulse rate:   81 / minute BP sitting:   106 / 66  (left arm) Cuff size:   large  Vitals Entered By: Boone Master CNA/MA (December 16, 2010 4:14 PM)  O2 Flow:  Room air CC: sinus pressure/congestion, PND, prod cough with thick yellow mucus, fatigue x11month - was given PCN for strep throat 3weeks ago by UC.  has been using sinsu rinse, mucinex and zyrtec Is Patient Diabetic? No Comments Medications reviewed with patient Daytime contact number verified with patient. Boone Master CNA/MA  December 16, 2010 4:16 PM    Copy to:  Kriste Basque Primary Provider/Referring Provider:  Kriste Basque  CC:  sinus pressure/congestion, PND, prod cough with thick yellow mucus, fatigue x4month - was given PCN for strep throat 3weeks ago by UC.  has been using sinsu rinse, and mucinex and zyrtec.  History of Present Illness: January 15, 2009--Presents for acute office viist. Complains of cough for 3 days (clear)  -Started after working out - [employer]- occas runny nose - occas chest tightness. Allergy symtpoms have been worse over last 6 months. Now has daily watery eyes despite using zyrtec once daily.   Prev. tested >10 years ago. Neighbor"s cat bothers her breathing. New house on slab, gas heat, small dog, encasings on bedding, not dusty or moldy, husband smokes outside.  02/18/09-NEW ALLERGY CONSULT for Dr Nadel/ NP Parett for this 49 yoF. She c/o daily perennial watering and itching eyes watery nose, mild wheezing. Ears itch. Wheezing started in past year. Hives in past treated with prednisone. denies problems with foods, insects or cosmetics, latex, contrast or aspirin. Previous allergy testing by neighbor Dr Willa Rough, without vaccine suggested. No ENT surgery. Hx sleep apnea- trying to lose weight  before CPAP.  04/01/09- Allergic rhinitis, hx OSA Right frontal HA off antihistamine for skin testing today. Skin test- strong positive  grass, weed, tree, dust/ mite  January 11, 2010--Presents for an acute office visit. Complains of increased SOB, wheezing, prod cough with yellow mucus, sinus pressure/congestion with brownish drainage, PND x2weeks, .OTC not helping.    ~  April 29, 2010:  she notes intermittent chest tightness esp if she doesn't take her Zyrtek/ Mucinex> we discussed her asthma & the need for Symbicort... she's only had one infect exac- 2/11 Rx w/ Omnicef... she has ?overactive bladder symtoms & we decided to try Toviaz Rx, DrToblin has been considering sling surg, she says... she has gained 10# in the last yr & we discussed diet + exercise needed...  July 07, 2010--Presents for an acute office visit. Complains of prod cough with thick yellow mucus, sore throat, PND w/ hoarseness x1.5weeks. OTC not helping. Cough is getting worse with very thick mucus. Coughing up lots of mucus. Initially started with drainage, sore throat, hoarseness. Leaving to vacation to [place] next week. Denies chest pain, dyspnea, orthopnea, hemoptysis, fever, n/v/d, edema, headache,recent travel or antibiotifcs.   12/16/10 --Presents for an acute office visit. Complains of sinus pressure/congestion, PND, prod cough with thick yellow mucus, fatigue x38month - was given PCN for strep throat 3weeks ago by UC.  Has been using sinus rinse, mucinex  and zyrtec. Cough is getting worse.She complains of lots of drainge, tickle in throat. Denies chest pain, orthopnea, hemoptysis, fever, n/v/d, edema, headache.   Medications Prior to Update: 1)  Zyrtec Allergy 10 Mg  Tabs (Cetirizine Hcl) .... Use As Needed 2)  Astepro 0.15 % Soln (Azelastine Hcl) .... 2 Puffs At Bedtime 3)  Mucinex Dm 30-600 Mg Xr12h-Tab (Dextromethorphan-Guaifenesin) .Marland Kitchen.. 1-2 Every 12 Hours As Needed For Cough 4)  Symbicort 80-4.5 Mcg/act Aero  (Budesonide-Formoterol Fumarate) .... Inhale 2 Puffs Two Times A Day 5)  Aspirin 81 Mg Tbec (Aspirin) .... Take 1 Tab By Mouth Once Daily.Marland KitchenMarland Kitchen 6)  Multivitamins   Tabs (Multiple Vitamin) .... Take 1 Tablet By Mouth Once A Day 7)  Vitamin D 1000 Unit Tabs (Cholecalciferol) .... Take 1 Tablet By Mouth Once A Day 8)  Citracal Plus  Tabs (Multiple Minerals-Vitamins) .... Take 1 Tablet By Mouth Once A Day 9)  Toviaz 8 Mg Xr24h-Tab (Fesoterodine Fumarate) .... Take 1 Tab By Mouth Once Daily For Overactive Bladder Symptoms... 10)  Cefdinir 300 Mg Caps (Cefdinir) .Marland Kitchen.. 1 By Mouth Two Times A Day 11)  Hydromet 5-1.5 Mg/60ml Syrp (Hydrocodone-Homatropine) .Marland Kitchen.. 1 To 2 Tsp Every 4-6 Hours As Needed 12)  Prednisone 10 Mg Tabs (Prednisone) .... 4 Each Am X 2 Days, 3 Each Am X 2 Days, 2 Each Am X 2 Days, 1 Each Am X 2 Days, Then Stop  Current Medications (verified): 1)  Zyrtec Allergy 10 Mg  Tabs (Cetirizine Hcl) .... Use As Needed 2)  Astepro 0.15 % Soln (Azelastine Hcl) .... 2 Puffs At Bedtime 3)  Mucinex Dm 30-600 Mg Xr12h-Tab (Dextromethorphan-Guaifenesin) .Marland Kitchen.. 1-2 Every 12 Hours As Needed For Cough 4)  Symbicort 80-4.5 Mcg/act Aero (Budesonide-Formoterol Fumarate) .... Inhale 2 Puffs Two Times A Day 5)  Aspirin 81 Mg Tbec (Aspirin) .... Take 1 Tab By Mouth Once Daily.Marland KitchenMarland Kitchen 6)  Multivitamins   Tabs (Multiple Vitamin) .... Take 1 Tablet By Mouth Once A Day 7)  Vitamin D 1000 Unit Tabs (Cholecalciferol) .... Take 1 Tablet By Mouth Once A Day 8)  Citracal Plus  Tabs (Multiple Minerals-Vitamins) .... Take 1 Tablet By Mouth Once A Day 9)  Hydromet 5-1.5 Mg/71ml Syrp (Hydrocodone-Homatropine) .Marland Kitchen.. 1 To 2 Tsp Every 4-6 Hours As Needed  Allergies (verified): 1)  ! Augmentin  Past History:  Past Medical History: Last updated: 05/14/2010 ALLERGIC RHINITIS (ICD-477.9) SLEEP APNEA, OBSTRUCTIVE, MODERATE (ICD-327.23) SNORING (ICD-786.09) ASTHMA (ICD-493.90) HYPERCHOLESTEROLEMIA, BORDERLINE (ICD-272.4) OTHER  ABNORMAL GLUCOSE (ICD-790.29) OVERWEIGHT (ICD-278.02) GASTROESOPHAGEAL REFLUX DISEASE (ICD-530.81) URINARY INCONTINENCE (ICD-788.30) DEGENERATIVE JOINT DISEASE (ICD-715.90)  Past Surgical History: Last updated: May 14, 2010 Partial knee replacement Fracture wrist w/ surg DrMyerdierks 8/08 Tubal ligation  Family History: Last updated: May 14, 2010 Father died age 28 w/ DM  & heart disease. Mother alive age 20 in nursing home w/ stroke, arthritis. 2 Siblings: 1 Brother in good health 1 Sister in good health  Social History: Last updated: 12/16/2010 Married, husb= Casimiro Needle, 37 yrs 2 children in good health never smoked social alcohol Work [employer], buisness owner--tags/reg.  declines flu shot 12-16-10  Risk Factors: Smoking Status: never (02/18/2009)  Social History: Married, husb= Casimiro Needle, 37 yrs 2 children in good health never smoked social alcohol Work [employer], buisness owner--tags/reg.  declines flu shot 12-16-10  Review of Systems      See HPI  Physical Exam  Additional Exam:  General: A/Ox3; pleasant and cooperative, NAD SKIN: no rash, lesions NODES: no lymphadenopathy HEENT: [place]/AT, EOM- WNL, Conjuctivae- clear, PERRLA, TM-WNL, Nose- clear drainge, Throat-  clear and wnl, Melampatti III-IV NECK: Supple w/ fair ROM, JVD- none, normal carotid impulses w/o bruits Thyroid- normal to palpation CHEST: Coarse BS w/no wheezing  HEART: RRR, no m/g/r heard ABDOMEN ZOX:WRUE, nl pulses, no edema  NEURO: Grossly intact to observation      Impression & Recommendations:  Problem # 1:  BRONCHITIS, ACUTE (ICD-466.0)  Slow to resolve Plan: Omnicef 300mg  two times a day for 7 days Mucinex DM two times a day as needed cough/congestion  Change to Allegra 180mg  once daily  Saline nasal rinse as needed  follow up Dr. Kriste Basque in 3months for physical.  Please contact office for sooner follow up if symptoms do not improve or worsen The following medications were removed from the medication  list:    Cefdinir 300 Mg Caps (Cefdinir) .Marland Kitchen... 1 by mouth two times a day Her updated medication list for this problem includes:    Mucinex Dm 30-600 Mg Xr12h-tab (Dextromethorphan-guaifenesin) .Marland Kitchen... 1-2 every 12 hours as needed for cough    Symbicort 80-4.5 Mcg/act Aero (Budesonide-formoterol fumarate) ..... Inhale 2 puffs two times a day    Hydromet 5-1.5 Mg/76ml Syrp (Hydrocodone-homatropine) .Marland Kitchen... 1 to 2 tsp every 4-6 hours as needed    Cefdinir 300 Mg Caps (Cefdinir) .Marland Kitchen... 1 by mouth two times a day  Orders: Est. Patient Level III (45409)  Medications Added to Medication List This Visit: 1)  Cefdinir 300 Mg Caps (Cefdinir) .Marland Kitchen.. 1 by mouth two times a day  Patient Instructions: 1)  Omnicef 300mg  two times a day for 7 days 2)  Mucinex DM two times a day as needed cough/congestion  3)  Change to Allegra 180mg  once daily  4)  Saline nasal rinse as needed  5)  follow up Dr. Kriste Basque in 3months for physical.  6)  Please contact office for sooner follow up if symptoms do not improve or worsen Prescriptions: SYMBICORT 80-4.5 MCG/ACT AERO (BUDESONIDE-FORMOTEROL FUMARATE) Inhale 2 puffs two times a day  #3 x 3   Entered and Authorized by:   Rubye Oaks NP   Signed by:   Lynnie Koehler NP on 12/16/2010   Method used:   Faxed to ...       [facility] (mail-order)             , [place]         Ph:        Fax: [phone]   RxID:   [id number] ASTEPRO 0.15 % SOLN (AZELASTINE HCL) 2 puffs at bedtime  #3 x 3   Entered and Authorized by:   Rubye Oaks NP   Signed by:   Fate Galanti NP on 12/16/2010   Method used:   Faxed to ...       [facility] (mail-order)             , [place]         Ph:        Fax: [phone]   RxID:   [id number] CEFDINIR 300 MG CAPS (CEFDINIR) 1 by mouth two times a day  #14 x 0   Entered and Authorized by:   Rubye Oaks NP   Signed by:   Rubye Oaks NP on 12/16/2010   Method used:   Electronically to        [id number]* (retail)       [address] [place], [place]  [address]  Ph: [phone]       Fax: [phone]   RxID:   [id number]    Orders Added: 1)  Est. Patient Level III [62952]

## 2010-12-30 NOTE — Letter (Signed)
Summary: [facility]  [facility]   Imported By: Lennie Odor 12/20/2010 10:30:12  _____________________________________________________________________  External Attachment:    Type:   Image     Comment:   External Document

## 2011-01-05 NOTE — Progress Notes (Signed)
Summary: coughing up yellow mucus--needs OV with SN  Phone Note Call from Patient   Caller: Patient Call For: tammy p Summary of Call: Patient phoned stated that she was seen by Tammy P about one week ago she finished her antibiotic on Sunday she was coughing up clear mucas while on the antibiotic but it is now a yellowish color and she developed  laryngitis on saturday. She uses [facility] on [address]. She can be reached at [phone] Initial call taken by: Vedia Coffer,  December 29, 2010 9:26 AM  Follow-up for Phone Call        Pt saw Tp on 12-16-10 and was given cefdinir. Spoke with pt and she states she had improvement while on Cefdinir. She states phlegm was clear, but now she has increased productive cough again with yellow phlegm. She also c/o laryngitis since Saturday. Pt denies fever.  Pt states she is taking allegra and mucinex as directed. Please advise. Carron Curie CMA  December 29, 2010 10:19 AM   Additional Follow-up for Phone Call Additional follow up Details #1::        She has been on 2 antibiotics already. I recommend saline nasal rinses , mucuinex ,allegra if not better will need ov with cxr , further testing  with Dr. Kriste Basque or Dr. Maple Hudson  Please contact office for sooner follow up if symptoms do not improve or worsen  Additional Follow-up by: Rubye Oaks NP,  December 29, 2010 10:35 AM    Additional Follow-up for Phone Call Additional follow up Details #2::    Pt states she is doing all of the recs above, not help. Has an appointment with SN in May and can not wait that long. Has only seen CY once. Leigh please advise when pt can come in for appointment. Thanks. Zackery Barefoot CMA  December 29, 2010 10:53 AM    next aval appt with SN is 2-24 at 11:30---cont with above recs--- Randell Loop CMA  December 29, 2010 11:18 AM   appointment scheduled and pt advised to keep with above recs. Zackery Barefoot CMA  December 29, 2010 11:29 AM

## 2011-01-07 ENCOUNTER — Ambulatory Visit (INDEPENDENT_AMBULATORY_CARE_PROVIDER_SITE_OTHER): Payer: BC Managed Care – PPO | Admitting: Pulmonary Disease

## 2011-01-07 ENCOUNTER — Encounter: Payer: Self-pay | Admitting: Pulmonary Disease

## 2011-01-07 DIAGNOSIS — E663 Overweight: Secondary | ICD-10-CM

## 2011-01-07 DIAGNOSIS — J209 Acute bronchitis, unspecified: Secondary | ICD-10-CM

## 2011-01-07 DIAGNOSIS — E785 Hyperlipidemia, unspecified: Secondary | ICD-10-CM

## 2011-01-07 DIAGNOSIS — J45909 Unspecified asthma, uncomplicated: Secondary | ICD-10-CM

## 2011-01-07 DIAGNOSIS — R7309 Other abnormal glucose: Secondary | ICD-10-CM

## 2011-01-07 DIAGNOSIS — J309 Allergic rhinitis, unspecified: Secondary | ICD-10-CM

## 2011-01-07 DIAGNOSIS — G4733 Obstructive sleep apnea (adult) (pediatric): Secondary | ICD-10-CM

## 2011-01-25 NOTE — Assessment & Plan Note (Signed)
Summary: productive cough//jwr   Primary Care Provider:  Kriste Basque  CC:  8 month ROV & add-on for persistant cough & congestion....  History of Present Illness: 60 y/o WF here for a follow up visit... she is the daughter of [name], a former pt of ours, and we see her for general med purposes...   ~  Jun10:  last seen by me 4/08 for an AB exac treated w/ Avelox & Dosepak in addition to her Symbicort, Mucinex, Protonix...  she is also followed by DrClance for Sleep- & DrYoung for Allergy...   ~  April 29, 2010:  she notes intermittent chest tightness esp if she doesn't take her Zyrtek/ Mucinex> we discussed her asthma & the need for Symbicort... she's only had one infect exac- 2/11 Rx w/ Omnicef... she has ?overactive bladder symtoms & we decided to try Toviaz Rx, DrToblin has been considering sling surg, she says... she has gained 10# in the last yr & we discussed diet + exercise needed...   ~  January 07, 2011:  Add-on for persist symptoms> started w/ URI, sore throat & given PCN shot at an urgent care;  she saw TP for persist symptoms + sinus congestion> given Omnicef, Mucinex;  now ret w/ lingering inflamm symptoms & "laryngitis"> we discussed trying to keep her off the Pred & reviewed regular dosing w/ all her meds- Zyrtek, Astepro, Mucinex, Symbicort, Hydromet, plus rx for MMW.   Current Problems:   PHYSICAL EXAMINATION (ICD-V70.0) - her GYN= DrTomblin for PAP, Mammograms, & -hasn't had BMD yet and she will discuss w/ him... on ASA 81mg /d, MVI & Vit D 1000 u daily.  ALLERGIC RHINITIS (ICD-477.9) - states that she had allergy eval by DrYoung but can't find this in old paper chart... she decided against allergy shots and uses Zyrtek, Mucinex, Nasal irrigations, & Astepro Prn...  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 she was given option of weight loss, oral appliance, CPAP  and she chose weight reduction- but weight has been betw 195 - 198 x several yrs, now up to 214# but she denies any worsening sleep problems, daytime hypersom symptoms, snoring, etc...  ASTHMA (ICD-493.90) - she has RADS and is treated w/ SYMBICORT 80 2 spBid, MUCINEX Bid, etc... she denies recent infectious exac...   ~  baseline CXR= clear, WNL.Marland Kitchen.  ~  PFT 3/08 showed FVC= 2.72 (80%), FEV1= 2.17 (79%), %1sec= 80, mid-flows= 77% pred...  ~  f/u CXR 6/11 is clear, WNL...  HYPERCHOLESTEROLEMIA, BORDERLINE (ICD-272.4) - labs from DrTomblin's office 2006 showed TChol 194, TG 167, HDL 53, LDL 108... he rec "f/u w/ PCP"  ~  FLP 6/10 on diet alone showed TChol 164, TG 141, HDL 43, LDL 93  ~  FLP 6/11 on diet alone showed TChol 228, TG 216, HDL 43, LDL 157... she does not want meds!  OTHER ABNORMAL GLUCOSE (ICD-790.29) - prev labs are from DrToblin's office 2006 showed FBS= 110, A1c= 5.4, and rec for pt to "f/u w/ PCP"  ~  labs 6/10 showed BS= 100, A1c= 5.8  ~  labs 6/11 showed BS= 119 & she is reminded to get wt down or start meds.  OVERWEIGHT (ICD-278.02) - we discussed diet + exercise   ~  weight 6/10 = 195#  ~  weight 6/11 = 204#  ~  weight 2/12 = 214#... she will start a "veggie diet"  GASTROESOPHAGEAL  REFLUX DISEASE (ICD-530.81) - hx of GERD symptoms prev treated w/ Protonix.Marland Kitchen. now uses OTC PRILOSEC Prn only & denies heartburn etc...  COLONOSCOPY - she states that DrTomblin sent her to [facility] for routine colonoscopy  ~2006 and pt reports that this procedure was neg.  URINARY INCONTINENCE (ICD-788.30) - she notes that DrTomblin knows all about this and they are working on it> considering sling surgery... but some of her symptoms suggest overactive bladder & we decided to try TOVIAZ 8mg  as trial...  DEGENERATIVE JOINT DISEASE (ICD-715.90) - she had knee surgery in the past & a fractured wrist but denies any current ortho problems...  Health Maintenance"  ~  GI:  she reports neg colonoscopy  by [facility] 2006  ~  GYN:  she is followed by DrTomblin  ~  Immunizations:     Preventive Screening-Counseling & Management  Alcohol-Tobacco     Smoking Status: never  Allergies: 1)  ! Augmentin  Comments:  Nurse/Medical Assistant: The patient's medications and allergies were reviewed with the patient and were updated in the Medication and Allergy Lists.  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 Father died age 24 w/ DM  & heart disease. Mother alive age 10 in nursing home w/ stroke, arthritis. 2 Siblings: 1 Brother in good health 1 Sister in good health  Social History: Reviewed history from 12/16/2010 and no changes required. Married, husb= Casimiro Needle, 37 yrs 2 children in good health never smoked social alcohol Work [employer], buisness owner--tags/reg.  declines flu shot 12-16-10  Review of Systems      See HPI       The patient complains of decreased hearing and dyspnea on exertion.  The patient denies anorexia, fever, weight loss, weight gain, vision loss, hoarseness, chest pain, syncope, peripheral edema, prolonged cough, headaches, hemoptysis, abdominal pain, melena, hematochezia, severe indigestion/heartburn, hematuria, incontinence, muscle weakness, suspicious skin lesions, transient blindness, difficulty walking, depression, unusual weight change, abnormal bleeding, enlarged lymph nodes, and angioedema.    Vital Signs:  Patient profile:   60 year  old female Height:      67 inches Weight:      213.38 pounds BMI:     33.54 O2 Sat:      97 % on Room air Temp:     99.2 degrees F oral Pulse rate:   76 / minute BP sitting:   144 / 84  (left arm) Cuff size:   regular  Vitals Entered By: Randell Loop CMA (January 07, 2011 11:05 AM)  O2 Sat at Rest %:  97 O2 Flow:  Room air CC: 8 month ROV & add-on for persistant cough & congestion... Is Patient Diabetic? No Pain Assessment Patient in pain? no      Comments meds updated today with pt   Physical Exam  Additional Exam:  WD, sl overweight, 60 y/o WF in NAD...  GENERAL:  Alert & oriented; pleasant & cooperative... HEENT:  Pratt/AT, EOM-wnl, PERRLA, Fundi-benign, EACs-clear, TMs-wnl, NOSE-clear, THROAT-clear & wnl. NECK:  Supple w/ full ROM; no JVD; normal carotid impulses w/o bruits; no thyromegaly or nodules palpated; no lymphadenopathy. CHEST:  Clear to P & A; without wheezes/ rales/ or rhonchi. HEART:  Regular Rhythm; without murmurs/ rubs/ or gallops. ABDOMEN:  Soft & nontender; normal bowel sounds; no organomegaly or masses detected. EXT: without deformities, mild arthritic changes; no varicose veins/ venous insuffic/ or edema. NEURO:  CN's intact; motor testing normal; sensory testing normal; gait normal & balance OK. DERM:  No lesions noted; no rash etc...    Impression & Recommendations:  Problem # 1:  BRONCHITIS, ACUTE (ICD-466.0) She has been battling an URI w/ congestion, drainage, sputum, sore throat & now laryngitis... If we are to avoid Pred Rx> rec maximizing her home meds & add MMW for the laryngitis... The following medications were removed from the medication list:    Cefdinir 300 Mg Caps (Cefdinir) .Marland Kitchen... 1 by mouth two times a day Her updated medication list for this problem includes:    Mucinex Dm 30-600 Mg Xr12h-tab (Dextromethorphan-guaifenesin) .Marland Kitchen... 1-2 every 12 hours as needed for cough    Symbicort 80-4.5 Mcg/act Aero (Budesonide-formoterol fumarate)  ..... Inhale 2 puffs two times a day    Hydromet 5-1.5 Mg/48ml Syrp (Hydrocodone-homatropine) .Marland Kitchen... 1 to 2 tsp every 4-6 hours as needed  Problem # 2:  ALLERGIC RHINITIS (ICD-477.9) Continue allergy meds per DrYoung... Her updated medication list for this problem includes:    Zyrtec Allergy 10 Mg Tabs (Cetirizine hcl) ..... Use as needed    Astepro 0.15 % Soln (Azelastine hcl) .Marland Kitchen... 2 puffs at bedtime  Problem # 3:  SLEEP APNEA, OBSTRUCTIVE, MODERATE (ICD-327.23) She declined rx in 2008 preferring to rx w/ wt reduction etc... needs to do a better job!!!  Problem # 4:  ASTHMA (ICD-493.90) Continue the inhaler... Her updated medication list for this problem includes:    Symbicort 80-4.5 Mcg/act Aero (Budesonide-formoterol fumarate) ..... Inhale 2 puffs two times a day  Problem # 5:  HYPERCHOLESTEROLEMIA, BORDERLINE (ICD-272.4) Needs better diwet & get the wt down...  Problem # 6:  OTHER ABNORMAL GLUCOSE (ICD-790.29) Needs better diet, no sweets, get the wt down...  Problem # 7:  GASTROESOPHAGEAL REFLUX DISEASE (ICD-530.81) GERD can play a roll in throat problems and advised to use the OTC PPI as discussed...  Problem # 8:  OTHER MEDICAL PROBLEMS AS NOTED>>>  Complete Medication List: 1)  Zyrtec Allergy 10 Mg Tabs (Cetirizine hcl) .... Use as needed 2)  Astepro 0.15 % Soln (Azelastine hcl) .... 2 puffs at bedtime 3)  Mucinex Dm 30-600 Mg Xr12h-tab (Dextromethorphan-guaifenesin) .Marland Kitchen.. 1-2 every 12 hours as needed for cough 4)  Symbicort 80-4.5 Mcg/act Aero (Budesonide-formoterol fumarate) .... Inhale 2 puffs two times a day 5)  Aspirin 81 Mg Tbec (Aspirin) .... Take 1 tab by mouth once daily.Marland KitchenMarland Kitchen 6)  Multivitamins Tabs (Multiple vitamin) .... Take 1 tablet by mouth once a day 7)  Vitamin D 1000 Unit Tabs (Cholecalciferol) .... Take 1 tablet by mouth once a day 8)  Citracal Plus Tabs (Multiple minerals-vitamins) .... Take 1 tablet by mouth once a day 9)  Hydromet 5-1.5 Mg/21ml Syrp  (Hydrocodone-homatropine) .Marland Kitchen.. 1 to 2 tsp every 4-6 hours as needed 10)  Magic Mouthwash  .... 1 tsp gargle & swallow up to 4 times daily as directed...  Patient Instructions: 1)  Today we updated your med list- see below.... 2)  Continue your current meds the same... 3)  We wrote a new perscription for MAGIC MOUTHWASH > use 1 tsp gargle & swallow up to 4 times daily as needed... 4)  Let's get on track w/ our diet + exercise program... the goal is to lose 10-15 lbs... 5)  Call for any problems... Prescriptions: MAGIC MOUTHWASH 1 tsp gargle & swallow up to 4 times daily as directed...  #4 oz x prn   Entered and Authorized by:   Michele Mcalpine MD   Signed by:   Michele Mcalpine MD on 01/07/2011   Method used:   Print then Give to Patient   RxID:   [id number]    Immunization History:  Influenza Immunization History:    Influenza:  declined (01/07/2011)  Pneumovax Immunization History:    Pneumovax:  declined (01/07/2011)

## 2011-03-16 ENCOUNTER — Ambulatory Visit: Payer: BC Managed Care – PPO | Admitting: Pulmonary Disease

## 2011-03-24 ENCOUNTER — Other Ambulatory Visit: Payer: Self-pay | Admitting: Pulmonary Disease

## 2011-03-28 ENCOUNTER — Other Ambulatory Visit: Payer: Self-pay | Admitting: Pulmonary Disease

## 2011-03-29 NOTE — Op Note (Signed)
NAMEAMELDA, HAPKE NO.:  [id number]   MEDICAL RECORD NO.:  [id number]          PATIENT TYPE:  INP   LOCATION:  5009                         FACILITY:  [facility]   PHYSICIAN:  Tennis Must Meyerdierks, M.D.DATE OF BIRTH:  [birth year]   DATE OF PROCEDURE:  07/13/2007  DATE OF DISCHARGE:  07/14/2007                               OPERATIVE REPORT   PREOPERATIVE DIAGNOSIS:  Fracture left distal radius.   POSTOPERATIVE DIAGNOSIS:  Fracture left distal radius.   PROCEDURE:  Open reduction and internal fixation, left distal radius  fracture.   SURGEON:  Lowell Bouton, M.D.   ANESTHESIA:  General.   OPERATIVE FINDINGS:  The patient had a comminuted distal radius fracture  with volar angulation.  The fracture did not appear to enter the  articular surface.   DESCRIPTION OF PROCEDURE:  Under general anesthesia with a tourniquet on  the left arm, the left hand was prepped and draped in the usual fashion.  After exsanguinating the limb, the tourniquet was inflated to 250 mmHg.  A longitudinal incision was made over the volar aspect of the distal  forearm in line with the FCR tendon.  It was carried down through the  subcutaneous tissues to the FCR sheath.  The sheath was then incised  longitudinally and retracted ulnarly.  The floor of the sheath was then  opened.  Blunt dissection was carried down to the pronator quadratus  which was released sharply from the radial attachment.  A Freer elevator  was used to elevate the pronator off of the distal radius.  The fracture  site was then identified and was reduced.  10 pounds of traction had  been applied across the end of the hand table with finger traps on the  index, long and ring fingers to assist with reduction.  The standard DVR  plate was then applied to the distal radius using a 2.5 mm screw in the  sliding hole.  The six pegs were then inserted distally using a  combination of smooth and threaded pegs.   Two remaining proximal screws  were inserted after drilling over a drill guide and inserting 3.5 mm  screws.  X-rays showed good alignment of the fracture with good  reduction of the angulation.  The radius was backed out to length.  The  wound was then copiously irrigated with saline. The pronator quadratus  was reattached with 4-0 Vicryl, the subcutaneous tissue was closed with  4-0 Vicryl.  A TLS drain was left in for drainage.  The skin was closed  with a 3-0 subcuticular Prolene.  Steri-Strips were applied.  0.5%  Marcaine was placed in the skin edges for pain control.  The patient was  then placed in a volar wrist splint.  The tourniquet was released with  good circulation of her hand.  She went to the recovery room awake in  stable, good condition.      Lowell Bouton, M.D.  Electronically Signed     EMM/MEDQ  D:  07/13/2007  T:  07/14/2007  Job:  [id number]

## 2011-03-29 NOTE — Procedures (Signed)
Carrie Perkins, Carrie Perkins NO.:  [id number]   MEDICAL RECORD NO.:  [id number]          PATIENT TYPE:  OUT   LOCATION:  SLEEP CENTER                 FACILITY:  [facility] [address]   PHYSICIAN:  Barbaraann Share, MD,FCCPDATE OF BIRTH:  January 14[birth year] 1949   DATE OF STUDY:  09/17/2007                            NOCTURNAL POLYSOMNOGRAM   REFERRING PHYSICIAN:  Barbaraann Share, MD,FCCP   INDICATION FOR STUDY:  Hypersomnia with sleep apnea.   EPWORTH SLEEPINESS SCORE:  7   MEDICATIONS:   SLEEP ARCHITECTURE:  The patient had a total sleep time of 323 minute  with no slow wave sleep and only 69 minutes of REM.  Sleep onset latency  was normal and REM onset was normal as well.  Sleep efficiency was  surprisingly good at 90%.   RESPIRATORY DATA:  The patient was found to have 31 obstructive and  central apneas, as well as 87 obstructive hypopneas for an  apnea/hypopnea index of 22 events per hour.  The events occurred  primarily during REM and there was moderate to loud snoring noted  throughout.   OXYGEN DATA:  There was O2 desaturation as low as 80% with the patient's  obstructive events.   CARDIAC DATA:  No clinically significant arrhythmias were noted.   MOVEMENT-PARASOMNIA:  None.   IMPRESSIONS-RECOMMENDATIONS:  Moderate obstructive sleep apnea/hypopnea  syndrome with an apnea/hypopnea index of 22 events per hour and oxygen  desaturation as low as 80%.  The events occurred primarily during REM.  Treatment for this degree of sleep apnea can include weight loss alone  if applicable, upper airway surgery, oral appliance, and also CPAP.  Clinical correlation is suggested.      Barbaraann Share, MD,FCCP  Diplomate, American Board of Sleep  Medicine  Electronically Signed    [facility]/MEDQ  D:  09/29/2007 14:18:35  T:  09/30/2007 20:35:00  Job:  [id number]

## 2011-03-29 NOTE — Op Note (Signed)
NAMEELCIE, PELSTER NO.:  [id number]   MEDICAL RECORD NO.:  [id number]          PATIENT TYPE:  INP   LOCATION:  5009                         FACILITY:  [facility]   PHYSICIAN:  Tennis Must Meyerdierks, M.D.DATE OF BIRTH:  [birth year]   DATE OF PROCEDURE:  07/13/2007  DATE OF DISCHARGE:  07/14/2007                               OPERATIVE REPORT   PREOPERATIVE DIAGNOSIS:  Fracture of left distal radius.   POSTOPERATIVE DIAGNOSIS:  Fracture of left distal radius.   PROCEDURE:  Open reduction and internal fixation, left distal radius  fracture.   SURGEON:  Lowell Bouton, M.D.   ANESTHESIA:  General.   OPERATIVE FINDINGS:  The patient had a volarly angulated fracture of the  distal radius.  The bone quality was good.   PROCEDURE:  Under general anesthesia, with a tourniquet on the left arm,  the left hand was prepped and draped in the usual fashion.  After  exsanguinating the limb, the tourniquet was inflated to 250 mmHg.  The  index, long and ring fingers were suspended with 10 pounds of traction  over the end of the table for traction.  A longitudinal incision was  made over the volar aspect of the distal forearm in line with the FCR  tendon.  Sharp dissection was carried down through the subcutaneous  tissues; bleeding points were coagulated.  Blunt dissection was carried  into the FCR tendon sheath and the tendon was retracted ulnarly.  The  floor of the sheath was then incised longitudinally.  The FPL tendon was  retracted ulnarly and the pronator quadratus was identified.  It was  released from its radial attachment longitudinally.  An elevator was  used to elevate the pronator quadratus off of the distal radius.  The  fracture site was then identified and was reduced with a Therapist, nutritional.  X-rays were used during the procedure and the alignment looked good.  A  DVR plate was then applied to the volar aspect of the distal radius  using a small  3.5-mm screw in the slotted hole.  The plate was then  repositioned and the 4 pegs were inserted distally.  Two further  cortical screws were inserted proximally and x-ray showed good alignment  of the fracture.  The wound was then irrigated copiously with saline.  A  TLSO drain was inserted.  The pronator quadratus was repaired with 4-0  Vicryl, subcutaneous tissue was closed with 4-0 Vicryl and the skin was  closed with a 3-0 subcuticular Prolene.  Steri-Strips were applied.  The  patient was placed in a volar wrist splint.  She tolerated the procedure  well went to the recovery room, awake and stable, in good condition.      Lowell Bouton, M.D.  Electronically Signed     EMM/MEDQ  D:  08/31/2007  T:  09/02/2007  Job:  [id number]

## 2011-03-29 NOTE — Assessment & Plan Note (Signed)
[facility]                             PULMONARY OFFICE NOTE   NAME:Carrie Perkins, Carrie Perkins                          MRN:          [id number]  DATE:09/10/2007                            DOB:          [birth year]    HISTORY OF PRESENT ILLNESS:  The patient is a 60 year old female whom I  have been asked to see for possible obstructive sleep apnea.  The  patient states that she has tiredness all day, every day.  The patient  has been noted to have loud snoring and pauses in her breathing during  sleep by her bed partner.  She admits that she is a very restless  sleeper.  She typically gets to bed between 10 and 12:30 and gets up  between 6:30 and 7:30 to start her day.  She is not rested in the  mornings whenever she arises.  She has many awakenings during the night  and she is really unsure why.  She denies any leg kicking during the  night, and has no symptoms consistent with restless leg syndrome.  The  patient works at the [employer] and really does not have significant periods of  inactivity to see whether or not she has daytime sleepiness.  She does  note that she has decreased concentration at work.  The patient also  states when she gets home she will frequently take a half hour to 1 hour  nap, usually between 6:30 and 8:30.  She denies any difficulty staying  awake watching TV or movies.  She has some sleep pressure with driving  long distances.   PAST MEDICAL HISTORY:  1. Asthma.  2. Status post tubal ligation.  3. History of multiple orthopedic procedures.   CURRENT MEDICATIONS:  1. Zyrtec over the counter 1 daily.  2. Tylenol Arthritis p.r.n.   The patient has no known drug allergies.   SOCIAL HISTORY:  She is married and has children.  She has never smoked.   FAMILY HISTORY:  Remarkable for her father having heart disease,  otherwise is noncontributory in first degree relatives.   REVIEW OF SYSTEMS:  As per history of present illness.  Also, see  patient intake form documented on the chart.   PHYSICAL EXAMINATION:  IN GENERAL:  She is an obese female in no acute  distress.  Blood pressure is 128/78, pulse 87, temperature 98.3, weight is 204  pounds.  She is 5 feet 7 inches tall.  O2 saturation on room air is 95%.  HEENT:  Pupils are equal, round, and reactive to light and  accommodation.  Extraocular muscles are intact.  Nares are narrowed but  patent.  Oropharynx does show elongation of the soft palate and uvula  with side wall narrowing.  NECK:  Supple without JVD or lymphadenopathy.  There is no palpable  thyromegaly.  CHEST:  Totally clear.  CARDIAC EXAM:  Reveals regular rate and rhythm with no murmurs, rubs, or  gallops.  ABDOMEN:  Soft, nontender with good bowel sounds.  GENITAL/RECTAL/BREAST EXAM:  Not done and not indicated.  LOWER EXTREMITIES:  Without edema.  Pulses are intact distally.  NEUROLOGICALLY:  Alert and oriented with no obvious motor deficits.   IMPRESSION:  Probable obstructive sleep apnea.  The patient gives a very  good history for this, is overweight, and has abnormal upper airway  anatomy.  I have had a long discussion with her about sleep apnea,  including its short term quality of life issues and its long term  cardiovascular issues.  I really think there is enough evidence here to  proceed with nocturnal polysomnography.  The patient is agreeable to  this approach.   PLAN:  1. Schedule for sleep study.  2. Work on weight loss.  3. The patient will followup after the above.     Barbaraann Share, MD,FCCP  Electronically Signed    [facility]/MedQ  DD: 09/11/2007  DT: 09/11/2007  Job #: [id number]   cc:   Guy Sandifer. Henderson Cloud, M.D.

## 2011-03-30 ENCOUNTER — Other Ambulatory Visit: Payer: Self-pay | Admitting: *Deleted

## 2011-03-30 NOTE — Telephone Encounter (Signed)
Refill request received from the pharmacy for Zostavax. Pls advise. Last filled 03/24/2011.

## 2011-03-31 NOTE — Telephone Encounter (Signed)
lmomtcb   Per SN--this is a shingles vaccine  And one shot is all you get  And ok for this vaccine if she has not had one before.

## 2011-04-01 NOTE — Assessment & Plan Note (Signed)
[facility]                             PULMONARY OFFICE NOTE   NAME:Carrie Perkins, Carrie Perkins                          MRN:          [id number]  DATE:10/23/2006                            DOB:          07/19/1951    This is a self-referral.   HISTORY OF PRESENT ILLNESS:  The patient is a 60 year old black female  who comes in today for treatment of chronic cough. She state that she  has had this for greater than five years and feels it is really not a  bother to her, but her friends around her feel that it is very  significant. It has been intermittent in nature and clearly does not  seem to be a problem every day. It is dry, and she denies the feeling  that it is coming from her throat area. It is not worse with lying down  and is not worsened by long conversation. She does feel that it gives  her more trouble whenever we are in dry conditions. The patient does  have a significant history of allergies throughout the year and take  Benadryl on a p.r.n. basis. She does have runny eyes as well as  significant post nasal drip that is a very big problem for her. She does  have intermittent GERD symptoms but less than one time a week. The  patient has never smoked. She has no history of familial or childhood  asthma. She has been seen by Dr. Colonel Bald and told that she has  allergies to dust mites and pet dander. She has not received treatment  from her.   PAST MEDICAL HISTORY:  Totally unremarkable except for orthopedic  procedures. The patient takes no medications except for Zyrtec 10 mg  daily p.r.n., and she has not done this in quite some time. She has no  known drug allergies.   SOCIAL HISTORY:  She is married and has children. She has never smoked.  She is self-employed.   FAMILY HISTORY:  Remarkable for father having heart disease. Otherwise  noncontributory.   REVIEW OF SYSTEMS:  As per history of present illness. Also patient  intake form  documented in the chart.   PHYSICAL EXAMINATION:  In general, she is an overweight white female in  no acute distress. Blood pressure is 124/70, pulse 84, temperature is  97.9, weight 183 pounds. O2 saturation on room air is 96%.  HEENT:  Pupils are equal, round and reactive to light and accommodation.  Extraocular muscles are intact. Nares are patent without discharge.  Oropharynx is clear.  NECK:  Supple without JVD or lymphadenopathy. There is no palpable  thyromegaly.  CHEST:  Totally clear.  CARDIAC:  Regular rate and rhythm. No murmurs, rubs, or gallops.  ABDOMEN:  Soft and nontender with good bowel sounds.  GENITAL EXAM, RECTAL EXAM, BREAST EXAM:  Not done and not indicated.  LOWER EXTREMITIES:  Are without edema. Pulses are intact distally.  NEUROLOGICAL:  Alert and oriented with no obvious motor deficits.   IMPRESSION:  Cough of unknown etiology. I think the leading candidates  are post nasal drip from allergic rhinitis as well as possible occult  laryngopharyngeal reflux. The patient has really no history to suggest  reactive airways disease, nor it is severe enough to be from a cyclical  mechanism. At this point, because she has a lot of allergy symptoms and  admits to fairly significant post nasal drip, we will go ahead and treat  her for this.   PLAN:  Trial of Xyzal daily for the next two weeks on a consistent  basis. The patient will call and let me know how she has responded to  this. She may ultimately require a trial of protein-pump inhibitor daily  to rule out other etiologies.     Barbaraann Share, MD,FCCP  Electronically Signed    [facility]  DD: 10/23/2006  DT: 10/24/2006  Job #: [id number]

## 2011-04-01 NOTE — Telephone Encounter (Signed)
[email or web address]  

## 2011-04-01 NOTE — Assessment & Plan Note (Signed)
[facility]                             PULMONARY OFFICE NOTE   NAME:NEALVernita, Tague                          MRN:          [id number]  DATE:01/01/2007                            DOB:          June 14, 1951    HISTORY OF PRESENT ILLNESS:  This is a 60 year old white female patient  of Dr. Teddy Spike who was seen in December of 2007 for a pulmonary  consult. Patient presented complaining of a persistent cough for 5  years. Patient was felt cough was being triggered by poorly controlled  rhinitis and was started on Xyzal. Patient does report that her cough  did decrease on Xyzal but did not totally resolve. And over the last 2  weeks she has had increased cough with thick yellow sputum, nasal  congestion, postnasal drip and hoarseness. Patient denies any  hemoptysis, orthopnea, or recent travel, or antibiotic use, or chest  pain.   PAST MEDICAL HISTORY:  Reviewed.   CURRENT MEDICATIONS:  Reviewed.   PHYSICAL EXAMINATION:  Patient is a pleasant female in no acute  distress, she is afebrile with stable vital signs. O2 saturation is 96%  on room air.  HEENT: Nasal mucosa is pale. Nontender sinuses. Posterior pharynx is  clear.  NECK: Supple without adenopathy, no JVD.  LUNG SOUNDS: Reveal coarse breath sounds bilaterally with a few  expiratory wheezes.  CARDIAC: Regular rate.  ABDOMEN: Soft and nontender.  EXTREMITIES: Warm without any edema.   IMPRESSION AND PLAN:  Acute tracheal bronchitis in a patient with a  known history of chronic cough. Patient will begin on Omnicef times 5  days. Will begin on Mucinex DM twice daily. And may use Endal HD #8  ounces,  1 to 2 teaspoons every 4 to 6 hours as needed for breakthrough  coughing. We will  recommend the patient restart Xyzal at bedtime for  any rhinitis symptoms that may be triggering cough. Also we will give a  trial of Protonix 40 mg daily for any residual reflux that could be  irritating the airways.  Patient will also complete a short course of  prednisone over the next week. And will return back with Dr. Shelle Iron in 2  weeks or sooner if needed.     Rubye Oaks, NP  Electronically Signed      Barbaraann Share, MD,FCCP  Electronically Signed   [facility]/MedQ  DD: 01/03/2007  DT: 01/03/2007  Job #: [id number]

## 2011-04-21 NOTE — Telephone Encounter (Signed)
[email or web address] x1 

## 2011-04-22 ENCOUNTER — Encounter: Payer: Self-pay | Admitting: Pulmonary Disease

## 2011-04-25 ENCOUNTER — Telehealth: Payer: Self-pay | Admitting: Pulmonary Disease

## 2011-04-25 ENCOUNTER — Encounter: Payer: Self-pay | Admitting: Pulmonary Disease

## 2011-04-25 ENCOUNTER — Ambulatory Visit (INDEPENDENT_AMBULATORY_CARE_PROVIDER_SITE_OTHER): Payer: BC Managed Care – PPO | Admitting: Pulmonary Disease

## 2011-04-25 DIAGNOSIS — R7309 Other abnormal glucose: Secondary | ICD-10-CM

## 2011-04-25 DIAGNOSIS — M1990 Unspecified osteoarthritis, unspecified site: Secondary | ICD-10-CM

## 2011-04-25 DIAGNOSIS — G4733 Obstructive sleep apnea (adult) (pediatric): Secondary | ICD-10-CM

## 2011-04-25 DIAGNOSIS — J309 Allergic rhinitis, unspecified: Secondary | ICD-10-CM

## 2011-04-25 DIAGNOSIS — E785 Hyperlipidemia, unspecified: Secondary | ICD-10-CM

## 2011-04-25 DIAGNOSIS — E663 Overweight: Secondary | ICD-10-CM

## 2011-04-25 DIAGNOSIS — J45909 Unspecified asthma, uncomplicated: Secondary | ICD-10-CM

## 2011-04-25 NOTE — Telephone Encounter (Signed)
Pt does not need to come in fasting this afternoon.  SN may have her return for labs but we will have to let SN see her first. thanks

## 2011-04-25 NOTE — Progress Notes (Signed)
Subjective:    Patient ID: Carrie Perkins, female    DOB: 21-Dec-1950, 60 y.o.   MRN: [id number]  HPI 60 y/o WF here for a follow up visit... she is the daughter of [name], a former pt of ours, and we see her for general med purposes...  ~  Jun10:  last seen by me 4/08 for an AB exac treated w/ Avelox & Dosepak in addition to her Symbicort, Mucinex, Protonix...  she is also followed by DrClance for Sleep- & DrYoung for Allergy...  ~  April 29, 2010:  she notes intermittent chest tightness esp if she doesn't take her Zyrtek/ Mucinex> we discussed her asthma & the need for Symbicort... she's only had one infect exac- 2/11 Rx w/ Omnicef... she has ?overactive bladder symtoms & we decided to try Toviaz Rx, DrToblin has been considering sling surg, she says... she has gained 10# in the last yr & we discussed diet + exercise needed...  ~  January 07, 2011:  Add-on for persist symptoms> started w/ URI, sore throat & given PCN shot at an urgent care;  she saw TP for persist symptoms + sinus congestion> given Omnicef, Mucinex;  now ret w/ lingering inflamm symptoms & "laryngitis"> we discussed trying to keep her off the Pred & reviewed regular dosing w/ all her meds- Zyrtek, Astepro, Mucinex, Symbicort, Hydromet, plus rx for MMW.  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  She tells me she stopped her ASA after something she saw on TV said you shouldn't take ASA if you have OSA "it causes cancer"; I told her I would look into this but i advised her to continue the 81mg  ASA to mitigate stroke risk...  SEE PROB LIST below>> she will ret for FASTING blood work ==> pending.   Problem List:  PHYSICAL EXAMINATION (ICD-V70.0) - her GYN= DrTomblin for PAP, Mammograms, & hasn't had BMD yet and she will discuss w/ him... on ASA 81mg /d, MVI & Vit D 1000 u daily.  ALLERGIC RHINITIS (ICD-477.9) - states that she had allergy eval by DrYoung but  can't find this in old paper chart... she decided against allergy shots and uses Zyrtek, Mucinex, Nasal irrigations, & Astepro Prn... Plus MMW as needed for throat.  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 she was given option of weight loss, oral appliance, CPAP and she chose weight reduction- but weight has been betw 195 - 198 for yrs, now up to 212# but she denies any worsening sleep problems, daytime hypersom symptoms, snoring, etc... States she rests well, wakes refreshed, & husb does not c/o her snoring.  ASTHMA (ICD-493.90) - she has RADS and is treated w/ SYMBICORT 80 2 spBid, MUCINEX Bid, Hycodan, etc... she denies recent infectious exac...  ~  baseline CXR= clear, WNL.Marland Kitchen. ~  PFT 3/08 showed FVC= 2.72 (80%), FEV1= 2.17 (79%), %1sec= 80, mid-flows= 77% pred... ~  f/u CXR 6/11 is clear, WNL...  HYPERCHOLESTEROLEMIA, BORDERLINE (ICD-272.4) - on Diet & exercise alone, she has declined med rx... ~  labs from DrTomblin's office 2006 showed TChol 194, TG 167, HDL 53, LDL 108... he rec "f/u w/ PCP" ~  FLP 6/10 on diet alone showed TChol 164, TG 141, HDL 43, LDL 93 ~  FLP 6/11 on diet alone showed TChol 228, TG 216, HDL 43, LDL  157... she does not want meds! ~  FLP 6/12 ==> pending  OTHER ABNORMAL GLUCOSE (ICD-790.29) - on Diet & exercise program... ~  labs are from DrToblin's office 2006 showed FBS= 110, A1c= 5.4, and rec for pt to "f/u w/ PCP" ~  labs 6/10 showed BS= 100, A1c= 5.8 ~  labs 6/11 showed BS= 119 & she is reminded to get wt down or start meds.  OVERWEIGHT (ICD-278.02) - we discussed diet, exercise, & wt reduction program... ~  weight 6/10 = 195# ~  weight 6/11 = 204#... We reviewed diet prescription. ~  weight 2/12 = 214#... she will start a "veggie diet". ~  Weight 6/12 = 212#... rec wt watchers or similar group.  GASTROESOPHAGEAL REFLUX DISEASE (ICD-530.81) - hx  of GERD symptoms prev treated w/ Protonix... She now uses "chewing gum" she says & reminded to use Prilosec OTC if needed...  COLONOSCOPY - she states that DrTomblin sent her to [facility] for routine colonoscopy ~2006 and pt reports that this procedure was neg.  URINARY INCONTINENCE (ICD-788.30) - she notes that DrTomblin knows all about this and they are working on it> considering sling surgery... but some of her symptoms suggest overactive bladder & we decided to try TOVIAZ 8mg  as trial (it helped, she says- off now & doing well).  DEGENERATIVE JOINT DISEASE (ICD-715.90) - she had knee surgery in the past & a fractured wrist but denies any current ortho problems...  Health Maintenance" ~  GI:  she reports neg colonoscopy by [facility] 2006 ~  GYN:  she is followed by DrTomblin ~  Immunizations:  She requests written Rx for Shingles vaccine.   Past Surgical History  Procedure Date  . Total knee arthroplasty   . Tubal ligation     Outpatient Encounter Prescriptions as of 04/25/2011  Medication Sig Dispense Refill  . Alum & Mag Hydroxide-Simeth (MAGIC MOUTHWASH) SOLN Take 5 mLs by mouth 4 (four) times daily.        Marland Kitchen azelastine (ASTELIN) 137 MCG/SPRAY nasal spray Place 1 spray into the nose 2 (two) times daily. Use in each nostril as directed       . budesonide-formoterol (SYMBICORT) 160-4.5 MCG/ACT inhaler Inhale 2 puffs into the lungs 2 (two) times daily.        . calcium citrate (CALCITRATE - DOSED IN MG ELEMENTAL CALCIUM) 950 MG tablet Take 1 tablet by mouth daily.        . cetirizine (ZYRTEC) 10 MG chewable tablet Chew 10 mg by mouth daily.        . Cholecalciferol 1000 UNITS capsule Take 1,000 Units by mouth daily.        Marland Kitchen dextromethorphan-guaiFENesin (MUCINEX DM) 30-600 MG per 12 hr tablet Take 1 tablet by mouth every 12 (twelve) hours.        Marland Kitchen HYDROcodone-homatropine (HYCODAN) 5-1.5 MG/5ML syrup Take 5 mLs by mouth every 6 (six) hours as needed.        . Multiple Vitamin  (MULTIVITAMIN) capsule Take 1 capsule by mouth daily.        Marland Kitchen aspirin 81 MG EC tablet Take 81 mg by mouth daily.          Allergies  Allergen Reactions  . WFU:XNATFTDDUKG+URKYHCWCB+JSEGBTDVVO Acid+Aspartame     REACTION: severe abd pain---intolerant    Review of Systems        See HPI - all other systems neg except as noted...  The patient complains of decreased hearing and dyspnea on exertion.  The patient denies anorexia,  fever, weight loss, weight gain, vision loss, hoarseness, chest pain, syncope, peripheral edema, prolonged cough, headaches, hemoptysis, abdominal pain, melena, hematochezia, severe indigestion/heartburn, hematuria, incontinence, muscle weakness, suspicious skin lesions, transient blindness, difficulty walking, depression, unusual weight change, abnormal bleeding, enlarged lymph nodes, and angioedema.    Objective:   Physical Exam    WD, sl overweight, 60 y/o WF in NAD...  GENERAL:  Alert & oriented; pleasant & cooperative... HEENT:  [place]/AT, EOM-wnl, PERRLA, Fundi-benign, EACs-clear, TMs-wnl, NOSE-clear, THROAT-clear & wnl. NECK:  Supple w/ full ROM; no JVD; normal carotid impulses w/o bruits; no thyromegaly or nodules palpated; no lymphadenopathy. CHEST:  Clear to P & A; without wheezes/ rales/ or rhonchi. HEART:  Regular Rhythm; without murmurs/ rubs/ or gallops. ABDOMEN:  Soft & nontender; normal bowel sounds; no organomegaly or masses detected. EXT: without deformities, mild arthritic changes; no varicose veins/ venous insuffic/ or edema. NEURO:  CN's intact; motor testing normal; sensory testing normal; gait normal & balance OK. DERM:  No lesions noted; no rash etc...   Assessment & Plan:   AR>  She has mult meds to help her cope w/ the springtime allergy season...  ASTHMA>  Stable on Symbicort, Mucinex, etc...  OSA>  She denies sleep disordered breathing, daytime hypersomnolence, etc...  She is advised to get on diet + exercise program & get wt down to  avoid future problems...  CHOL>  She will ret for FLP; she has refused med rx for her Chol in the past; needs much better diet etc...  Abn BS>  F/u fasting labs pending, but everything depends on better diet, exercise, get wt down...  GI>  Followed by [id number]...  GYN/ Urine incont>  Followed by DrTomblin; Toviaz helped & she is off this med now, doing satis...  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

## 2011-04-25 NOTE — Telephone Encounter (Signed)
[email or web address]

## 2011-04-25 NOTE — Patient Instructions (Signed)
Today we updated your med list in EPIC...    Continue your current meds the same...  Please return to our lab one morning this week for your fasting blood work...    Then please call the PHONE TREE in a few days for your results...    Dial [id number] & when prompted enter your patient number followed by the # symbol...    Your patient number is:  [id number]#  Keep up the diet & exercise program efforts...  Call for any questions or if we can be of assistance in any way...  Let's plan a follow up visit in 6 months, sooner if needed for problems.Marland KitchenMarland Kitchen

## 2011-04-25 NOTE — Telephone Encounter (Signed)
Called and spoke with pt.  Pt states she is scheduled to see SN today at 3:30. Pt wanted to know if she needs labs prior to appt and if so, if she needs to be fasting.   Also wanted to know if SN will ok for her to get the shingles vaccine here or if it would be cheaper at [facility].

## 2011-04-27 NOTE — Telephone Encounter (Signed)
See phone note from 04/25/2011.

## 2011-05-01 ENCOUNTER — Encounter: Payer: Self-pay | Admitting: Pulmonary Disease

## 2011-08-26 LAB — DIFFERENTIAL
Lymphocytes Relative: 12
Monocytes Absolute: 0.7
Monocytes Relative: 5
Neutro Abs: 11 — ABNORMAL HIGH

## 2011-08-26 LAB — CBC
MCHC: 35
MCV: 86.1
RDW: 13.6

## 2011-10-26 ENCOUNTER — Ambulatory Visit: Payer: BC Managed Care – PPO | Admitting: Pulmonary Disease

## 2011-12-12 ENCOUNTER — Encounter: Payer: Self-pay | Admitting: Pulmonary Disease

## 2011-12-12 ENCOUNTER — Other Ambulatory Visit (INDEPENDENT_AMBULATORY_CARE_PROVIDER_SITE_OTHER): Payer: BC Managed Care – PPO

## 2011-12-12 ENCOUNTER — Ambulatory Visit (INDEPENDENT_AMBULATORY_CARE_PROVIDER_SITE_OTHER): Payer: BC Managed Care – PPO | Admitting: Pulmonary Disease

## 2011-12-12 DIAGNOSIS — R7309 Other abnormal glucose: Secondary | ICD-10-CM

## 2011-12-12 DIAGNOSIS — J45909 Unspecified asthma, uncomplicated: Secondary | ICD-10-CM

## 2011-12-12 DIAGNOSIS — E663 Overweight: Secondary | ICD-10-CM

## 2011-12-12 DIAGNOSIS — M1990 Unspecified osteoarthritis, unspecified site: Secondary | ICD-10-CM

## 2011-12-12 DIAGNOSIS — Z23 Encounter for immunization: Secondary | ICD-10-CM

## 2011-12-12 DIAGNOSIS — E785 Hyperlipidemia, unspecified: Secondary | ICD-10-CM

## 2011-12-12 DIAGNOSIS — J309 Allergic rhinitis, unspecified: Secondary | ICD-10-CM

## 2011-12-12 DIAGNOSIS — K219 Gastro-esophageal reflux disease without esophagitis: Secondary | ICD-10-CM

## 2011-12-12 LAB — CBC WITH DIFFERENTIAL/PLATELET
Basophils Absolute: 0.1 10*3/uL (ref 0.0–0.1)
Eosinophils Relative: 2.4 % (ref 0.0–5.0)
HCT: 41.7 % (ref 36.0–46.0)
Lymphs Abs: 3 10*3/uL (ref 0.7–4.0)
MCV: 86.8 fl (ref 78.0–100.0)
Monocytes Absolute: 0.6 10*3/uL (ref 0.1–1.0)
Neutro Abs: 3.6 10*3/uL (ref 1.4–7.7)
Platelets: 287 10*3/uL (ref 150.0–400.0)
RDW: 13.7 % (ref 11.5–14.6)

## 2011-12-12 LAB — LIPID PANEL
HDL: 47 mg/dL (ref >39.00)
Total CHOL/HDL Ratio: 4
VLDL: 31.8 mg/dL (ref 0.0–40.0)

## 2011-12-12 LAB — TSH: TSH: 1.41 u[IU]/mL (ref 0.35–5.50)

## 2011-12-12 LAB — HEPATIC FUNCTION PANEL
AST: 26 U/L (ref 0–37)
Alkaline Phosphatase: 56 U/L (ref 39–117)
Total Bilirubin: 0.7 mg/dL (ref 0.3–1.2)

## 2011-12-12 LAB — BASIC METABOLIC PANEL
GFR: 94.69 mL/min (ref >60.00)
Potassium: 3.9 mEq/L (ref 3.5–5.1)
Sodium: 142 mEq/L (ref 135–145)

## 2011-12-12 NOTE — Patient Instructions (Addendum)
Today we updated your med list in our EPIC system...    Continue your current medications the same...  Today we did your follow up fasting blood work...    Please call the PHONE TREE in a few days for your results...    Dial [id number] & when prompted enter your patient number followed by the # symbol...    Your patient number is:  [id number]#  We gave you the 2012 Flu vaccine & wrote a prescription for the shingles shot...  Keep up the great job w/ diet & exercise program!!!  Call for any questions...  Let's plan a brief follow up eval in about 6 months.Marland KitchenMarland Kitchen

## 2011-12-12 NOTE — Progress Notes (Signed)
Subjective:    Patient ID: Carrie Perkins, female    DOB: Jan 21, 1951, 62 y.o.   MRN: [id number]  HPI 61 y/o WF here for a follow up visit... she is the daughter of Carrie Perkins, a former pt of ours, and we see her for general med purposes...  ~  January 07, 2011:  Add-on for persist symptoms> started w/ URI, sore throat & given PCN shot at an urgent care;  she saw Carrie Perkins for persist symptoms + sinus congestion> given Omnicef, Mucinex;  now ret w/ lingering inflamm symptoms & "laryngitis"> we discussed trying to keep her off the Pred & reviewed regular dosing w/ all her meds- Zyrtek, Astepro, Mucinex, Symbicort, Hydromet, plus rx for MMW.  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  She tells me she stopped her ASA after something she saw on TV said you shouldn't take ASA if you have OSA "it causes cancer"; I told her I would look into this but i advised her to continue the 81mg  ASA to mitigate stroke risk...  ~  December 12, 2011:  30mo ROV & Carrie Perkins is doing quite well- on diet & exercising w/ a trainer, wt down 5# so far & she is encouraged; requests 2012 Flu vaccine & Rx for shingles shot;  See prob list below>>     She remains on Symbicort 160bid along w/ Zyrtek/ Astelin/ Mucinex- and doing well w/o much cough, sputum, SOB, etc...    Also due for FASTING blood work to recheck FLP, DM labs, etc> (see below)    Taking Calcium, MVI, Vit D from Carrie Perkins...           Problem List:  PHYSICAL EXAMINATION (ICD-V70.0) - her GYN= Carrie Perkins for PAP, Mammograms, & hasn't had BMD yet and she will discuss w/ him... on ASA 81mg /d, MVI & Vit D 1000 u daily (Vit D level 1/13 = 48).  ALLERGIC RHINITIS (ICD-477.9) - states that she had allergy eval by Carrie Perkins but can't find this in old paper chart... she decided against allergy shots and uses Zyrtek, Mucinex, Nasal irrigations, & Astepro Prn... Plus MMW as needed for throat.  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 she was given option of weight loss, oral appliance, CPAP and she chose weight reduction- but weight has been betw 195 - 198 for yrs, now 207# but she denies any worsening sleep problems, daytime hypersom symptoms, snoring, etc... States she rests well, wakes refreshed, & husb does not c/o her snoring.  ASTHMA (ICD-493.90) - she has RADS and is treated w/ SYMBICORT 80 2 spBid, MUCINEX Bid, Hycodan, etc... she denies recent infectious exac...  ~  baseline CXR= clear, WNL.Marland Kitchen. ~  PFT 3/08 showed FVC= 2.72 (80%), FEV1= 2.17 (79%), %1sec= 80, mid-flows= 77% pred... ~  f/u CXR 6/11 is clear, WNL...  HYPERCHOLESTEROLEMIA, BORDERLINE (ICD-272.4) - on Diet & exercise alone, she has declined med rx... ~  labs from Carrie Perkins's office 2006 showed TChol 194, TG 167, HDL 53, LDL 108... he rec "f/u w/ PCP" ~  FLP 6/10 on diet alone showed TChol 164, TG 141, HDL 43, LDL 93 ~  FLP 6/11 on diet alone showed TChol 228, TG 216, HDL 43, LDL 157... she does not want meds! ~  FLP 1/13 on diet alone showed TChol 207, TG 159, HDL 47,  LDL 140... Sl improved, keep up the good work.  OTHER ABNORMAL GLUCOSE (ICD-790.29) - on Diet & exercise program... ~  labs are from DrToblin's office 2006 showed FBS= 110, A1c= 5.4, and rec for pt to "f/u w/ PCP" ~  labs 6/10 showed BS= 100, A1c= 5.8 ~  labs 6/11 showed BS= 119 & she is reminded to get wt down or start meds. ~  Labs 1/13 showed BS= 102, A1c= 6.0  OVERWEIGHT (ICD-278.02) - we discussed diet, exercise, & wt reduction program... ~  weight 6/10 = 195# ~  weight 6/11 = 204#... We reviewed diet prescription. ~  weight 2/12 = 214#... she will start a "veggie diet". ~  Weight 6/12 = 212#... rec wt watchers or similar group. ~  Weight 1/13 = 207#  GASTROESOPHAGEAL REFLUX DISEASE (ICD-530.81) - hx of GERD symptoms prev treated w/  Protonix... She now uses "chewing gum" she says & reminded to use Prilosec OTC if needed...  COLONOSCOPY - she states that Carrie Perkins sent her to Carrie Perkins for routine colonoscopy ~2006 and pt reports that this procedure was neg.  URINARY INCONTINENCE (ICD-788.30) - she notes that Carrie Perkins knows all about this and they are working on it> considering sling surgery... but some of her symptoms suggest overactive bladder & we decided to try TOVIAZ 8mg  as trial (it helped, she says- off now & doing well).  DEGENERATIVE JOINT DISEASE (ICD-715.90) - she had TKR surg in the past & a fractured wrist requiring ORIF, but denies any current ortho problems...  Health Maintenance >> ~  GI:  she reports neg colonoscopy by Carrie Perkins 2006 ~  GYN:  she is followed by Carrie Perkins ~  Immunizations:  She requests written Rx for Shingles vaccine.   Past Surgical History  Procedure Date  . Total knee arthroplasty   . Tubal ligation   . Left wrist orif after fall 2008    Outpatient Encounter Prescriptions as of 12/12/2011  Medication Sig Dispense Refill  . Alum & Mag Hydroxide-Simeth (MAGIC MOUTHWASH) SOLN Take 5 mLs by mouth 4 (four) times daily.        Marland Kitchen aspirin 81 MG EC tablet Take 81 mg by mouth daily.        Marland Kitchen azelastine (ASTELIN) 137 MCG/SPRAY nasal spray Place 1 spray into the nose 2 (two) times daily. Use in each nostril as directed       . budesonide-formoterol (SYMBICORT) 160-4.5 MCG/ACT inhaler Inhale 2 puffs into the lungs 2 (two) times daily.        . calcium citrate (CALCITRATE - DOSED IN MG ELEMENTAL CALCIUM) 950 MG tablet Take 1 tablet by mouth daily.        . cetirizine (ZYRTEC) 10 MG chewable tablet Chew 10 mg by mouth daily.        . Cholecalciferol 1000 UNITS capsule Take 1,000 Units by mouth daily.        Marland Kitchen dextromethorphan-guaiFENesin (MUCINEX DM) 30-600 MG per 12 hr tablet Take 1 tablet by mouth every 12 (twelve) hours.        . Multiple Vitamin (MULTIVITAMIN) capsule Take 1 capsule by mouth  daily.        Marland Kitchen HYDROcodone-homatropine (HYCODAN) 5-1.5 MG/5ML syrup Take 5 mLs by mouth every 6 (six) hours as needed.          Allergies  Allergen Reactions  . WUJ:[id number] Acid+Aspartame     REACTION: severe abd pain---intolerant    Review of Systems        See HPI -  all other systems neg except as noted... The patient complains of decreased hearing and dyspnea on exertion.  The patient denies anorexia, fever, weight loss, weight gain, vision loss, hoarseness, chest pain, syncope, peripheral edema, prolonged cough, headaches, hemoptysis, abdominal pain, melena, hematochezia, severe indigestion/heartburn, hematuria, incontinence, muscle weakness, suspicious skin lesions, transient blindness, difficulty walking, depression, unusual weight change, abnormal bleeding, enlarged lymph nodes, and angioedema.     Objective:   Physical Exam    WD, sl overweight, 61 y/o WF in NAD...  GENERAL:  Alert & oriented; pleasant & cooperative... HEENT:  Platter/AT, EOM-wnl, PERRLA, Fundi-benign, EACs-clear, TMs-wnl, NOSE-clear, THROAT-clear & wnl. NECK:  Supple w/ full ROM; no JVD; normal carotid impulses w/o bruits; no thyromegaly or nodules palpated; no lymphadenopathy. CHEST:  Clear to P & A; without wheezes/ rales/ or rhonchi. HEART:  Regular Rhythm; without murmurs/ rubs/ or gallops. ABDOMEN:  Soft & nontender; normal bowel sounds; no organomegaly or masses detected. EXT: without deformities, mild arthritic changes; no varicose veins/ venous insuffic/ or edema. NEURO:  CN's intact; motor testing normal; sensory testing normal; gait normal & balance OK. DERM:  No lesions noted; no rash etc...  RADIOLOGY DATA:  Reviewed in the EPIC EMR & discussed w/ the patient...    >>Last CXR 6/11 showed norm heart size, clear lungs, NAD...    >>Last EKG 6/11 showed NSR, rate78, sl IVCD, NSSTTWA...  LABORATORY DATA:  Reviewed in the EPIC EMR & discussed w/ the patient...   Assessment &  Plan:   AR>  She has mult meds to help her cope w/ her allergies...  ASTHMA>  Stable on Symbicort, Mucinex, etc...  OSA>  She denies sleep disordered breathing, daytime hypersomnolence, etc; She is advised to get on diet + exercise program & get wt down to avoid future problems...  CHOL>  She has intermittently shown excellent control on diet Rx, then again off diet numbers looked pretty bad; currently in between & sl improved- keep up the good work & get wt down...  Abn BS>  BS & A1c look good on diet> everything depends on better diet, exercise, get wt down...  GI>  Followed by OZHYQMVH...  GYN/ Urine incont>  Followed by Carrie Perkins; Toviaz helped & she is off this med now, doing satis...  DJD>  Aware, uses OTC meds as needed...   Patient's Medications  New Prescriptions   No medications on file  Previous Medications   ALUM & MAG HYDROXIDE-SIMETH (MAGIC MOUTHWASH) SOLN    Take 5 mLs by mouth 4 (four) times daily.     ASPIRIN 81 MG EC TABLET    Take 81 mg by mouth daily.     AZELASTINE (ASTELIN) 137 MCG/SPRAY NASAL SPRAY    Place 1 spray into the nose 2 (two) times daily. Use in each nostril as directed    BUDESONIDE-FORMOTEROL (SYMBICORT) 160-4.5 MCG/ACT INHALER    Inhale 2 puffs into the lungs 2 (two) times daily.     CALCIUM CITRATE (CALCITRATE - DOSED IN MG ELEMENTAL CALCIUM) 950 MG TABLET    Take 1 tablet by mouth daily.     CETIRIZINE (ZYRTEC) 10 MG CHEWABLE TABLET    Chew 10 mg by mouth daily.     CHOLECALCIFEROL 1000 UNITS CAPSULE    Take 1,000 Units by mouth daily.     DEXTROMETHORPHAN-GUAIFENESIN (MUCINEX DM) 30-600 MG PER 12 HR TABLET    Take 1 tablet by mouth every 12 (twelve) hours.     HYDROCODONE-HOMATROPINE (HYCODAN) 5-1.5 MG/5ML SYRUP  Take 5 mLs by mouth every 6 (six) hours as needed.     MULTIPLE VITAMIN (MULTIVITAMIN) CAPSULE    Take 1 capsule by mouth daily.    Modified Medications   No medications on file  Discontinued Medications   No medications on file

## 2012-01-05 ENCOUNTER — Telehealth: Payer: Self-pay | Admitting: Pulmonary Disease

## 2012-01-05 MED ORDER — SULFAMETHOXAZOLE-TRIMETHOPRIM 800-160 MG PO TABS: 1.0000 | ORAL_TABLET | Freq: Two times a day (BID) | ORAL | Status: AC

## 2012-01-05 NOTE — Telephone Encounter (Signed)
Per SN can call in septra DS #20 1 BID and take align daily while on this abx. I spoke with pt and advised her of SN recs. She voiced her understanding and had no questions.

## 2012-01-05 NOTE — Telephone Encounter (Signed)
Called spoke with patient who c/o prod cough yellow mucus with small amount of BRB mixed in, sinus congestion/pressure with yellow drainage, PND x4days, cough x24hours - denies wheezing, SOB, f/c/s.  Pt has been treating at home with mucinex BID, astelin nasal spray QHS, netti pot and alka seltzer cold and sinus.  Patient aware SN with no openings today and TP not in office > okay with treatment over the phone.  Last ov with SN 1.28.13, upcoming 7.29.13.  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    Allergies  Allergen Reactions  . ZOX:[id number]+AOZHYQMVHQ Acid+Aspartame     REACTION: severe abd pain---intolerant   Dr Kriste Basque please advise, thanks.

## 2012-01-19 ENCOUNTER — Telehealth: Payer: Self-pay | Admitting: Pulmonary Disease

## 2012-01-19 MED ORDER — LEVOFLOXACIN 750 MG PO TABS: 750.0000 mg | ORAL_TABLET | Freq: Every day | ORAL | Status: AC

## 2012-01-19 NOTE — Telephone Encounter (Signed)
Was given septra ds #20 1 po BID on 2.21.13.  LMOM TCB x1.

## 2012-01-19 NOTE — Telephone Encounter (Signed)
Per SN---stop the alka seltzer, increase mucinex 2 po bid with plenty of fluids, call in levaquin 750 mg  1 daily #7.  Called and spoke with pt and she is aware of SN recs.  She will increase the mucinex and will start on new abx.  Pt voiced her understanding of recs.

## 2012-01-19 NOTE — Telephone Encounter (Signed)
Pt returning call can be reached at [phone].Carrie Perkins

## 2012-01-19 NOTE — Telephone Encounter (Signed)
Finished the abx on 3.4.13 - still prod cough with thick yellow mucus, nasal drainage with same-colored, PND, weakness/fatigue.  Reports some better but not much.  Still taking mucinex BID and alka seltzer.  Denies tightnes in chest, wheezing, SOB, f/c/s.  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 Allergies  Allergen Reactions  . ZOX:[id number]+AOZHYQMVHQ Acid+Aspartame     REACTION: severe abd pain---intolerant    Dr Kriste Basque please advise, thanks.

## 2012-02-03 ENCOUNTER — Encounter: Payer: Self-pay | Admitting: Pulmonary Disease

## 2012-02-03 ENCOUNTER — Ambulatory Visit (INDEPENDENT_AMBULATORY_CARE_PROVIDER_SITE_OTHER): Payer: BC Managed Care – PPO | Admitting: Pulmonary Disease

## 2012-02-03 ENCOUNTER — Telehealth: Payer: Self-pay | Admitting: Pulmonary Disease

## 2012-02-03 ENCOUNTER — Ambulatory Visit (INDEPENDENT_AMBULATORY_CARE_PROVIDER_SITE_OTHER)
Admission: RE | Admit: 2012-02-03 | Discharge: 2012-02-03 | Disposition: A | Discharge status: Home / Self Care | Payer: BC Managed Care – PPO | Source: Ambulatory Visit | Attending: Pulmonary Disease | Admitting: Pulmonary Disease

## 2012-02-03 VITALS — BP 118/82 | HR 80 | Temp 97.3°F | Ht 67.0 in | Wt 209.5 lb

## 2012-02-03 DIAGNOSIS — M1990 Unspecified osteoarthritis, unspecified site: Secondary | ICD-10-CM

## 2012-02-03 DIAGNOSIS — K219 Gastro-esophageal reflux disease without esophagitis: Secondary | ICD-10-CM

## 2012-02-03 DIAGNOSIS — J45901 Unspecified asthma with (acute) exacerbation: Secondary | ICD-10-CM

## 2012-02-03 DIAGNOSIS — E785 Hyperlipidemia, unspecified: Secondary | ICD-10-CM

## 2012-02-03 DIAGNOSIS — R32 Unspecified urinary incontinence: Secondary | ICD-10-CM

## 2012-02-03 DIAGNOSIS — E663 Overweight: Secondary | ICD-10-CM

## 2012-02-03 DIAGNOSIS — G4733 Obstructive sleep apnea (adult) (pediatric): Secondary | ICD-10-CM

## 2012-02-03 MED ORDER — BECLOMETHASONE DIPROPIONATE 80 MCG/ACT NA AERS: 2.0000 | INHALATION_SPRAY | Freq: Every day | NASAL | Status: DC

## 2012-02-03 MED ORDER — PREDNISONE 20 MG PO TABS: ORAL_TABLET | ORAL | Status: DC

## 2012-02-03 MED ORDER — METHYLPREDNISOLONE ACETATE 80 MG/ML IJ SUSP
80.0000 mg | Freq: Once | INTRAMUSCULAR | Status: AC
  Administered 2012-02-03: 80 mg via INTRAMUSCULAR

## 2012-02-03 NOTE — Telephone Encounter (Signed)
Called and spoke with the pharmacy and they stated that the rx that was sent in for qnasl came through as the generic qvar and they were confused about the directions.  Explained that the rx was for the qnasl and the pharmacy will get this filled for the pt.

## 2012-02-03 NOTE — Progress Notes (Addendum)
Subjective:    Patient ID: Carrie Perkins, female    DOB: 09-23-1951, 61 y.o.   MRN: [id number]  HPI 61 y/o WF here for a follow up visit... she is the daughter of Carrie Perkins, a former pt of ours, and we see her for general med purposes...  ~  January 07, 2011:  Add-on for persist symptoms> started w/ URI, sore throat & given PCN shot at an urgent care;  she saw TP for persist symptoms + sinus congestion> given Omnicef, Mucinex;  now ret w/ lingering inflamm symptoms & "laryngitis"> we discussed trying to keep her off the Pred & reviewed regular dosing w/ all her meds- Zyrtek, Astepro, Mucinex, Symbicort, Hydromet, plus rx for MMW.  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  She tells me she stopped her ASA after something she saw on TV said you shouldn't take ASA if you have OSA "it causes cancer"; I told her I would look into this but i advised her to continue the 81mg  ASA to mitigate stroke risk...  ~  December 12, 2011:  9mo ROV & Carrie Perkins is doing quite well- on diet & exercising w/ a trainer, wt down 5# so far & she is encouraged; requests 2012 Flu vaccine & Rx for shingles shot;  See prob list below>>     She remains on Symbicort 160bid along w/ Zyrtek/ Astelin/ Mucinex- and doing well w/o much cough, sputum, SOB, etc...    Also due for FASTING blood work to recheck FLP, DM labs, etc> (see below)    Taking Calcium, MVI, Vit D from DrTomblin...   ~  February 03, 2012:  86mo ROV & she is c/o persistent/ recurrent URI symptoms w/ head & chest congestion> states it started w/ sore throat, sinus drainage to chest, cough w/ yellow sput, given SeptraDS which helped, then symptoms returned & given Levaquin, improved again then recurred once more; now taking Mucinex, phlegm is thick beige, continued sinus draining, ears stopped up, chest congested, dry cough, min SOB, no f/c/s etc...  We discussed Rx w/ Depo80 + Prednisone 20mg  4d  tapering sched and specific treatment for Sinuses (Zyrtek Qam, Saline via Netti & mist Q1H, QNasl at bedtime) & Bronchial tubes (Symbicort160-2spBid, Mucinex-2Bid + Fluids...    CXR today shows heart size upper lim of norm, clear lungs, mild DJD in spine...  If not resolved we will need to consider ENT eval...          Problem List:  PHYSICAL EXAMINATION (ICD-V70.0) - her GYN= DrTomblin for PAP, Mammograms, & hasn't had BMD yet and she will discuss w/ him... on ASA 81mg /d, MVI & Vit D 1000 u daily (Vit D level 1/13 = 48).  ALLERGIC RHINITIS (ICD-477.9) - states that she had allergy eval by DrYoung but can't find this in old paper chart... she decided against allergy shots and uses Zyrtek, Mucinex, Nasal irrigations, & Astepro Prn... Plus MMW as needed for throat.  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 she was given option of weight loss, oral appliance, CPAP and she chose weight reduction- but weight has been betw 195 - 198 for yrs, now 207# but she denies any worsening sleep problems, daytime hypersom symptoms, snoring, etc... States she rests well, wakes refreshed, & husb does not c/o her snoring.  ASTHMA (ICD-493.90) -  she has RADS and is treated w/ SYMBICORT 80- 2spBid, MUCINEX Bid, Hycodan, etc... she denies recent infectious exac...  ~  baseline CXR= clear, WNL.Marland Kitchen. ~  PFT 3/08 showed FVC= 2.72 (80%), FEV1= 2.17 (79%), %1sec= 80, mid-flows= 77% pred... ~  f/u CXR 6/11 is clear, WNL.Marland Kitchen. 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  HYPERCHOLESTEROLEMIA, BORDERLINE (ICD-272.4) - on Diet & exercise alone, she has declined med rx... ~  labs from DrTomblin's office 2006 showed TChol 194, TG 167, HDL 53, LDL 108... he rec "f/u w/ PCP" ~  FLP 6/10 on diet alone showed TChol 164, TG 141, HDL 43, LDL 93 ~  FLP 6/11 on diet alone showed TChol 228, TG 216,  HDL 43, LDL 157... she does not want meds! ~  FLP 1/13 on diet alone showed TChol 207, TG 159, HDL 47, LDL 140... Sl improved, keep up the good work.  OTHER ABNORMAL GLUCOSE (ICD-790.29) - on Diet & exercise program... ~  labs are from DrToblin's office 2006 showed FBS= 110, A1c= 5.4, and rec for pt to "f/u w/ PCP" ~  labs 6/10 showed BS= 100, A1c= 5.8 ~  labs 6/11 showed BS= 119 & she is reminded to get wt down or start meds. ~  Labs 1/13 showed BS= 102, A1c= 6.0  OVERWEIGHT (ICD-278.02) - we discussed diet, exercise, & wt reduction program... ~  weight 6/10 = 195# ~  weight 6/11 = 204#... We reviewed diet prescription. ~  weight 2/12 = 214#... she will start a "veggie diet". ~  Weight 6/12 = 212#... rec wt watchers or similar group. ~  Weight 1/13 = 207#  GASTROESOPHAGEAL REFLUX DISEASE (ICD-530.81) - hx of GERD symptoms prev treated w/ Protonix... She now uses "chewing gum" she says & reminded to use Prilosec OTC if needed...  COLONOSCOPY - she states that DrTomblin sent her to [facility] for routine colonoscopy ~2006 and pt reports that this procedure was neg.  URINARY INCONTINENCE (ICD-788.30) - she notes that DrTomblin knows all about this and they are working on it> considering sling surgery... but some of her symptoms suggest overactive bladder & we decided to try TOVIAZ 8mg  as trial (it helped, she says- off now & doing well).  DEGENERATIVE JOINT DISEASE (ICD-715.90) - she had TKR surg in the past & a fractured wrist requiring ORIF, but denies any current ortho problems...  Health Maintenance >> ~  GI:  she reports neg colonoscopy by [facility] 2006 ~  GYN:  she is followed by DrTomblin ~  Immunizations:  She requests written Rx for Shingles vaccine.   Past Surgical History  Procedure Date  . Total knee arthroplasty   . Tubal ligation   . Left wrist orif after fall 2008    Outpatient Encounter Prescriptions as of 02/03/2012  Medication Sig Dispense Refill  . aspirin 81 MG  EC tablet Take 81 mg by mouth daily.        Marland Kitchen azelastine (ASTELIN) 137 MCG/SPRAY nasal spray Place 1 spray into the nose 2 (two) times daily. Use in each nostril as directed       . budesonide-formoterol (SYMBICORT) 160-4.5 MCG/ACT inhaler Inhale 2 puffs into the lungs 2 (two) times daily.        . calcium citrate (CALCITRATE - DOSED IN MG ELEMENTAL CALCIUM) 950 MG tablet Take 1 tablet by mouth daily.        . cetirizine (ZYRTEC) 10 MG chewable tablet Chew 10 mg by  mouth daily.        . Cholecalciferol 1000 UNITS capsule Take 1,000 Units by mouth daily.        Marland Kitchen dextromethorphan-guaiFENesin (MUCINEX DM) 30-600 MG per 12 hr tablet Take 2 tablets by mouth every 12 (twelve) hours.       . Multiple Vitamin (MULTIVITAMIN) capsule Take 1 capsule by mouth daily.        . Alum & Mag Hydroxide-Simeth (MAGIC MOUTHWASH) SOLN Take 5 mLs by mouth 4 (four) times daily.        Marland Kitchen HYDROcodone-homatropine (HYCODAN) 5-1.5 MG/5ML syrup Take 5 mLs by mouth every 6 (six) hours as needed.          Allergies  Allergen Reactions  . ZOX:[id number] Acid+Aspartame     REACTION: severe abd pain---intolerant    Review of Systems        See HPI - all other systems neg except as noted... The patient complains of decreased hearing and dyspnea on exertion.  The patient denies anorexia, fever, weight loss, weight gain, vision loss, hoarseness, chest pain, syncope, peripheral edema, prolonged cough, headaches, hemoptysis, abdominal pain, melena, hematochezia, severe indigestion/heartburn, hematuria, incontinence, muscle weakness, suspicious skin lesions, transient blindness, difficulty walking, depression, unusual weight change, abnormal bleeding, enlarged lymph nodes, and angioedema.     Objective:   Physical Exam    WD, sl overweight, 61 y/o WF in NAD...  GENERAL:  Alert & oriented; pleasant & cooperative... HEENT:  Evans/AT, EOM-wnl, PERRLA, Fundi-benign, EACs- sl red, TMs-wnl, NOSE- sl red/  inflammed, THROAT- injected, otherw wnl. NECK:  Supple w/ full ROM; no JVD; normal carotid impulses w/o bruits; no thyromegaly or nodules palpated; no lymphadenopathy. CHEST:  Clear to P & A; without wheezes/ rales/ or rhonchi. HEART:  Regular Rhythm; without murmurs/ rubs/ or gallops. ABDOMEN:  Soft & nontender; normal bowel sounds; no organomegaly or masses detected. EXT: without deformities, mild arthritic changes; no varicose veins/ venous insuffic/ or edema. NEURO:  CN's intact; motor testing normal; sensory testing normal; gait normal & balance OK. DERM:  No lesions noted; no rash etc...  RADIOLOGY DATA:  Reviewed in the EPIC EMR & discussed w/ the patient...    >>Prev CXR 6/11 showed norm heart size, clear lungs, NAD...    >>Last EKG 6/11 showed NSR, rate78, sl IVCD, NSSTTWA...    >>CXR 3/13 showed heart at upper lim norm, clear lungs, mild DJD in spine...   LABORATORY DATA:  Reviewed in the EPIC EMR & discussed w/ the patient...   Assessment & Plan:   AR & ASTHMA w/ refractory AB exac> she does not need more antibiotics, Rx w/ Depo80/ Pred 20mg  4d tapering sched, Nasal Rx w/ Zyrtek, Saline, QNasl; and Bronchial Rx w/ symbicort, Mucinex, Fluids...  OSA>  She denies sleep disordered breathing, daytime hypersomnolence, etc; She is advised to get on diet + exercise program & get wt down to avoid future problems...  CHOL>  She has intermittently shown excellent control on diet Rx, then again off diet numbers looked pretty bad; currently in between & sl improved- keep up the good work & get wt down...  Abn BS>  BS & A1c look good on diet> everything depends on better diet, exercise, get wt down...  GI>  Followed by IONGEXBM...  GYN/ Urine incont>  Followed by DrTomblin; Toviaz helped & she is off this med now, doing satis...  DJD>  Aware, uses OTC meds as needed...   Patient's Medications  New Prescriptions   BECLOMETHASONE DIPROPIONATE 80 MCG/ACT  AERS    Place 2 sprays into the  nose at bedtime.   PREDNISONE (DELTASONE) 20 MG TABLET    1 tablet bid x 4 days, 1 daily x 4 days, 1/2 tablet daily x 4 days, 1/2 tablet every other day until gone  Previous Medications   ALUM & MAG HYDROXIDE-SIMETH (MAGIC MOUTHWASH) SOLN    Take 5 mLs by mouth 4 (four) times daily.     ASPIRIN 81 MG EC TABLET    Take 81 mg by mouth daily.     AZELASTINE (ASTELIN) 137 MCG/SPRAY NASAL SPRAY    Place 1 spray into the nose 2 (two) times daily. Use in each nostril as directed    BUDESONIDE-FORMOTEROL (SYMBICORT) 160-4.5 MCG/ACT INHALER    Inhale 2 puffs into the lungs 2 (two) times daily.     CALCIUM CITRATE (CALCITRATE - DOSED IN MG ELEMENTAL CALCIUM) 950 MG TABLET    Take 1 tablet by mouth daily.     CETIRIZINE (ZYRTEC) 10 MG CHEWABLE TABLET    Chew 10 mg by mouth daily.     CHOLECALCIFEROL 1000 UNITS CAPSULE    Take 1,000 Units by mouth daily.     DEXTROMETHORPHAN-GUAIFENESIN (MUCINEX DM) 30-600 MG PER 12 HR TABLET    Take 2 tablets by mouth every 12 (twelve) hours.    HYDROCODONE-HOMATROPINE (HYCODAN) 5-1.5 MG/5ML SYRUP    Take 5 mLs by mouth every 6 (six) hours as needed.     MULTIPLE VITAMIN (MULTIVITAMIN) CAPSULE    Take 1 capsule by mouth daily.    Modified Medications   No medications on file  Discontinued Medications   No medications on file

## 2012-02-03 NOTE — Patient Instructions (Signed)
Today we updated your med list in our EPIC system...    Continue your current medications the same...  For your Sinuses:    Use the ZYRTEK 10mg  each AM...    Use the Netti pot & Nasal saline mist frequently during the day to keep the nasal passages moist 7 clear...    [place] ethe new QNASL 2 sprays in each nostil at bedtime...  For your Bronchial tubes:    We finished the antibiotics for infection...    Use the MUCINEX 2 tabs twice daily w/ lots of fluids all day long...    Use the Symbicort 2sprays twice daily regularly...    We are adding the PREDNISONE in a tapering schedule as directed for the airway inflammation...    Ok yo use the cough syrup as needed...  Today we did your follow up CXR to be sure it is clear...    We will call you w/ the results...  Rest, Fluids, Tylenol, etc... And don'tr forget the chicken soup!!!

## 2012-06-11 ENCOUNTER — Ambulatory Visit: Payer: BC Managed Care – PPO | Admitting: Pulmonary Disease

## 2012-06-21 ENCOUNTER — Encounter: Payer: Self-pay | Admitting: Pulmonary Disease

## 2012-06-21 ENCOUNTER — Ambulatory Visit (INDEPENDENT_AMBULATORY_CARE_PROVIDER_SITE_OTHER): Payer: BC Managed Care – PPO | Admitting: Pulmonary Disease

## 2012-06-21 VITALS — BP 128/78 | HR 68 | Temp 97.0°F | Ht 67.0 in | Wt 206.9 lb

## 2012-06-21 DIAGNOSIS — J45909 Unspecified asthma, uncomplicated: Secondary | ICD-10-CM

## 2012-06-21 DIAGNOSIS — E663 Overweight: Secondary | ICD-10-CM

## 2012-06-21 DIAGNOSIS — R7309 Other abnormal glucose: Secondary | ICD-10-CM

## 2012-06-21 DIAGNOSIS — M1990 Unspecified osteoarthritis, unspecified site: Secondary | ICD-10-CM

## 2012-06-21 DIAGNOSIS — K219 Gastro-esophageal reflux disease without esophagitis: Secondary | ICD-10-CM

## 2012-06-21 DIAGNOSIS — E785 Hyperlipidemia, unspecified: Secondary | ICD-10-CM

## 2012-06-21 DIAGNOSIS — J309 Allergic rhinitis, unspecified: Secondary | ICD-10-CM

## 2012-06-21 NOTE — Progress Notes (Signed)
Subjective:    Patient ID: Carrie Perkins, female    DOB: Apr 11, 1951, 61 y.o.   MRN: [id number]  HPI 61 y/o WF here for a follow up visit... she is the daughter of Carrie Perkins, a former pt of ours, and we see her for general med purposes...  ~  January 07, 2011:  Add-on for persist symptoms> started w/ URI, sore throat & given PCN shot at an urgent care;  she saw TP for persist symptoms + sinus congestion> given Omnicef, Mucinex;  now ret w/ lingering inflamm symptoms & "laryngitis"> we discussed trying to keep her off the Pred & reviewed regular dosing w/ all her meds- Zyrtek, Astepro, Mucinex, Symbicort, Hydromet, plus rx for MMW.  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  She tells me she stopped her ASA after something she saw on TV said you shouldn't take ASA if you have OSA "it causes cancer"; I told her I would look into this but i advised her to continue the 81mg  ASA to mitigate stroke risk...  ~  December 12, 2011:  190mo ROV & Carrie Perkins is doing quite well- on diet & exercising w/ a trainer, wt down 5# so far & she is encouraged; requests 2012 Flu vaccine & Rx for shingles shot;  See prob list below>>     She remains on Symbicort 160bid along w/ Zyrtek/ Astelin/ Mucinex- and doing well w/o much cough, sputum, SOB, etc...    Also due for FASTING blood work to recheck FLP, DM labs, etc> (see below)    Taking Calcium, MVI, Vit D from DrTomblin...   ~  February 03, 2012:  90mo ROV & she is c/o persistent/ recurrent URI symptoms w/ head & chest congestion> states it started w/ sore throat, sinus drainage to chest, cough w/ yellow sput, given SeptraDS which helped, then symptoms returned & given Levaquin, improved again then recurred once more; now taking Mucinex, phlegm is thick beige, continued sinus draining, ears stopped up, chest congested, dry cough, min SOB, no f/c/s etc...  We discussed Rx w/ Depo80 + Prednisone 20mg  4d  tapering sched and specific treatment for Sinuses (Zyrtek Qam, Saline via Netti & mist Q1H, QNasl at bedtime) & Bronchial tubes (Symbicort160-2spBid, Mucinex-2Bid + Fluids...    CXR today shows heart size upper lim of norm, clear lungs, mild DJD in spine...  If not resolved we will need to consider ENT eval...  ~  June 21, 2012:  54mo ROV & Carrie Perkins reports that she is seeing Musician for plantar fasciitis & taking Benedryl for mosquito bites on her legs;  She has been eating better she tells me & exercising w/ a personal trainer & now going to the "fit station" for healthy meals... Weight is 207# by our scale (down 3#).Marland KitchenMarland Kitchen We reviewed prob list, meds, xrays and labs> see below for updates >>           Problem List:  PHYSICAL EXAMINATION (ICD-V70.0) - her GYN= DrTomblin for PAP, Mammograms, & hasn't had BMD yet and she will discuss w/ him... on ASA 81mg /d, MVI & Vit D 1000 u daily (Vit D level 1/13 = 48).  ALLERGIC RHINITIS (ICD-477.9) - states that she had allergy eval by DrYoung but can't find this in old paper chart... she decided against allergy shots and uses Zyrtek, Mucinex, Nasal irrigations, & Astepro Prn... Plus MMW as needed for throat.  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 she was given option of weight loss, oral appliance, CPAP and she chose weight reduction- but weight has been betw 195 - 198 for yrs, now 207# but she denies any worsening sleep problems, daytime hypersom symptoms, snoring, etc... States she rests well, wakes refreshed, & husb does not c/o her snoring.  ASTHMA (ICD-493.90) - she has RADS and is treated w/ SYMBICORT 160- 2spBid, MUCINEX Bid, Hycodan, etc... she denies recent infectious exac...  ~  baseline CXR= clear, WNL.Marland Kitchen. ~  PFT 3/08 showed FVC= 2.72 (80%), FEV1= 2.17 (79%), %1sec= 80, mid-flows= 77% pred... ~  f/u CXR 6/11 is clear,  WNL.Marland Kitchen. 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  HYPERCHOLESTEROLEMIA, BORDERLINE (ICD-272.4) - on Diet & exercise alone, she has declined med rx... ~  labs from DrTomblin's office 2006 showed TChol 194, TG 167, HDL 53, LDL 108... he rec "f/u w/ PCP" ~  FLP 6/10 on diet alone showed TChol 164, TG 141, HDL 43, LDL 93 ~  FLP 6/11 on diet alone showed TChol 228, TG 216, HDL 43, LDL 157... she does not want meds! ~  FLP 1/13 on diet alone showed TChol 207, TG 159, HDL 47, LDL 140... Sl improved, keep up the good work.  OTHER ABNORMAL GLUCOSE (ICD-790.29) - on Diet & exercise program... ~  labs are from DrToblin's office 2006 showed FBS= 110, A1c= 5.4, and rec for pt to "f/u w/ PCP" ~  labs 6/10 showed BS= 100, A1c= 5.8 ~  labs 6/11 showed BS= 119 & she is reminded to get wt down or start meds. ~  Labs 1/13 showed BS= 102, A1c= 6.0  OVERWEIGHT (ICD-278.02) - we discussed diet, exercise, & wt reduction program... ~  weight 6/10 = 195# ~  weight 6/11 = 204#... We reviewed diet prescription. ~  weight 2/12 = 214#... she will start a "veggie diet". ~  Weight 6/12 = 212#... rec wt watchers or similar group. ~  Weight 1/13 = 207# ~  Weight 8/13 = 207#  GASTROESOPHAGEAL REFLUX DISEASE (ICD-530.81) - hx of GERD symptoms prev treated w/ Protonix... She now uses "chewing gum" she says & reminded to use Prilosec OTC if needed...  COLONOSCOPY - she states that DrTomblin sent her to [facility] for routine colonoscopy ~2006 and pt reports that this procedure was neg.  URINARY INCONTINENCE (ICD-788.30) - she notes that DrTomblin knows all about this and they are working on it> considering sling surgery... but some of her symptoms suggest overactive bladder & we decided to try TOVIAZ 8mg  as trial (it helped, she says- off now & doing well).  DEGENERATIVE JOINT DISEASE (ICD-715.90) - she had TKR surg in the past & a fractured wrist requiring ORIF, but denies any current  ortho problems... ~  Tried on [facility] 7.5mg  daily as needed...  Health Maintenance >> ~  GI:  she reports neg colonoscopy by [facility] 2006 ~  GYN:  she is followed by DrTomblin ~  Immunizations:  She requests written Rx for Shingles vaccine.   Past Surgical History  Procedure Date  . Total knee arthroplasty   . Tubal ligation   . Left wrist orif after fall 2008    Outpatient Encounter Prescriptions as of 06/21/2012  Medication Sig Dispense Refill  . azelastine (ASTELIN) 137 MCG/SPRAY nasal spray Place 1 spray into the nose 2 (two) times  daily. Use in each nostril as directed       . Beclomethasone Dipropionate (QNASL) 80 MCG/ACT AERS Place 2 sprays into the nose at bedtime.  8.7 g  6  . budesonide-formoterol (SYMBICORT) 160-4.5 MCG/ACT inhaler Inhale 2 puffs into the lungs 2 (two) times daily.        . calcium citrate (CALCITRATE - DOSED IN MG ELEMENTAL CALCIUM) 950 MG tablet Take 1 tablet by mouth daily.        . cetirizine (ZYRTEC) 10 MG chewable tablet Chew 10 mg by mouth daily.        . Cholecalciferol 1000 UNITS capsule Take 1,000 Units by mouth daily.        Marland Kitchen dextromethorphan-guaiFENesin (MUCINEX DM) 30-600 MG per 12 hr tablet Take 2 tablets by mouth every 12 (twelve) hours.       . meloxicam (MOBIC) 7.5 MG tablet Take 1 to 2 tablets by mouth daily      . Multiple Vitamin (MULTIVITAMIN) capsule Take 1 capsule by mouth daily.        Marland Kitchen aspirin 81 MG EC tablet Take 81 mg by mouth daily.        Marland Kitchen DISCONTD: Alum & Mag Hydroxide-Simeth (MAGIC MOUTHWASH) SOLN Take 5 mLs by mouth 4 (four) times daily.        Marland Kitchen DISCONTD: HYDROcodone-homatropine (HYCODAN) 5-1.5 MG/5ML syrup Take 5 mLs by mouth every 6 (six) hours as needed.        Marland Kitchen DISCONTD: predniSONE (DELTASONE) 20 MG tablet 1 tablet bid x 4 days, 1 daily x 4 days, 1/2 tablet daily x 4 days, 1/2 tablet every other day until gone  16 tablet  0    Allergies  Allergen Reactions  . Amoxicillin-Pot Clavulanate     REACTION: severe abd  pain---intolerant    Review of Systems        See HPI - all other systems neg except as noted... The patient complains of decreased hearing and dyspnea on exertion.  The patient denies anorexia, fever, weight loss, weight gain, vision loss, hoarseness, chest pain, syncope, peripheral edema, prolonged cough, headaches, hemoptysis, abdominal pain, melena, hematochezia, severe indigestion/heartburn, hematuria, incontinence, muscle weakness, suspicious skin lesions, transient blindness, difficulty walking, depression, unusual weight change, abnormal bleeding, enlarged lymph nodes, and angioedema.     Objective:   Physical Exam    WD, sl overweight, 61 y/o WF in NAD...  GENERAL:  Alert & oriented; pleasant & cooperative... HEENT:  [place]/AT, EOM-wnl, PERRLA, Fundi-benign, EACs- sl red, TMs-wnl, NOSE- sl red/ inflammed, THROAT- injected, otherw wnl. NECK:  Supple w/ full ROM; no JVD; normal carotid impulses w/o bruits; no thyromegaly or nodules palpated; no lymphadenopathy. CHEST:  Clear to P & A; without wheezes/ rales/ or rhonchi. HEART:  Regular Rhythm; without murmurs/ rubs/ or gallops. ABDOMEN:  Soft & nontender; normal bowel sounds; no organomegaly or masses detected. EXT: without deformities, mild arthritic changes; no varicose veins/ venous insuffic/ or edema. NEURO:  CN's intact; motor testing normal; sensory testing normal; gait normal & balance OK. DERM:  No lesions noted; no rash etc...  RADIOLOGY DATA:  Reviewed in the EPIC EMR & discussed w/ the patient...     LABORATORY DATA:  Reviewed in the EPIC EMR & discussed w/ the patient...   Assessment & Plan:   AR & ASTHMA w/ refractory AB exac>  No recent exac & stable on Symbicort...  OSA>  She denies sleep disordered breathing, daytime hypersomnolence, etc; She is advised to get on diet +  exercise program & get wt down to avoid future problems...  CHOL>  She has intermittently shown excellent control on diet Rx, then again off diet  numbers looked pretty bad; currently in between & sl improved- keep up the good work & get wt down...  Abn BS>  BS & A1c look good on diet> everything depends on better diet, exercise, get wt down...  GI>  Followed by [id number]...  GYN/ Urine incont>  Followed by DrTomblin; Toviaz helped & she is off this med now, doing satis...  DJD>  Aware, uses OTC meds as needed...   Patient's Medications  New Prescriptions   No medications on file  Previous Medications   ASPIRIN 81 MG EC TABLET    Take 81 mg by mouth daily.     AZELASTINE (ASTELIN) 137 MCG/SPRAY NASAL SPRAY    Place 1 spray into the nose 2 (two) times daily. Use in each nostril as directed    BECLOMETHASONE DIPROPIONATE (QNASL) 80 MCG/ACT AERS    Place 2 sprays into the nose at bedtime.   BUDESONIDE-FORMOTEROL (SYMBICORT) 160-4.5 MCG/ACT INHALER    Inhale 2 puffs into the lungs 2 (two) times daily.     CALCIUM CITRATE (CALCITRATE - DOSED IN MG ELEMENTAL CALCIUM) 950 MG TABLET    Take 1 tablet by mouth daily.     CETIRIZINE (ZYRTEC) 10 MG CHEWABLE TABLET    Chew 10 mg by mouth daily.     CHOLECALCIFEROL 1000 UNITS CAPSULE    Take 1,000 Units by mouth daily.     DEXTROMETHORPHAN-GUAIFENESIN (MUCINEX DM) 30-600 MG PER 12 HR TABLET    Take 2 tablets by mouth every 12 (twelve) hours.    MELOXICAM (MOBIC) 7.5 MG TABLET    Take 1 to 2 tablets by mouth daily   MULTIPLE VITAMIN (MULTIVITAMIN) CAPSULE    Take 1 capsule by mouth daily.    Modified Medications   No medications on file  Discontinued Medications   ALUM & MAG HYDROXIDE-SIMETH (MAGIC MOUTHWASH) SOLN    Take 5 mLs by mouth 4 (four) times daily.     HYDROCODONE-HOMATROPINE (HYCODAN) 5-1.5 MG/5ML SYRUP    Take 5 mLs by mouth every 6 (six) hours as needed.     PREDNISONE (DELTASONE) 20 MG TABLET    1 tablet bid x 4 days, 1 daily x 4 days, 1/2 tablet daily x 4 days, 1/2 tablet every other day until gone

## 2012-06-21 NOTE — Patient Instructions (Addendum)
Today we updated your med list in our EPIC system...    Continue your current medications the same...  Keep up the good work w/ your diet & exercise program...  Have a blast in [place]!!!  Call for any problems...  Let's plan a follow up visit w/ FASTING blood work in 6 months.Marland KitchenMarland Kitchen

## 2012-09-03 DIAGNOSIS — M25569 Pain in unspecified knee: Secondary | ICD-10-CM | POA: Insufficient documentation

## 2012-12-26 ENCOUNTER — Ambulatory Visit: Payer: BC Managed Care – PPO | Admitting: Pulmonary Disease

## 2013-01-09 ENCOUNTER — Other Ambulatory Visit: Payer: Self-pay | Admitting: Pulmonary Disease

## 2013-01-09 ENCOUNTER — Encounter: Payer: Self-pay | Admitting: Pulmonary Disease

## 2013-01-09 ENCOUNTER — Other Ambulatory Visit (INDEPENDENT_AMBULATORY_CARE_PROVIDER_SITE_OTHER): Payer: BC Managed Care – PPO

## 2013-01-09 ENCOUNTER — Ambulatory Visit (INDEPENDENT_AMBULATORY_CARE_PROVIDER_SITE_OTHER): Payer: BC Managed Care – PPO | Admitting: Pulmonary Disease

## 2013-01-09 VITALS — BP 124/72 | HR 59 | Temp 97.9°F | Ht 67.0 in | Wt 209.8 lb

## 2013-01-09 DIAGNOSIS — R32 Unspecified urinary incontinence: Secondary | ICD-10-CM

## 2013-01-09 DIAGNOSIS — F411 Generalized anxiety disorder: Secondary | ICD-10-CM

## 2013-01-09 DIAGNOSIS — R7309 Other abnormal glucose: Secondary | ICD-10-CM

## 2013-01-09 DIAGNOSIS — E785 Hyperlipidemia, unspecified: Secondary | ICD-10-CM

## 2013-01-09 DIAGNOSIS — M1990 Unspecified osteoarthritis, unspecified site: Secondary | ICD-10-CM

## 2013-01-09 DIAGNOSIS — K219 Gastro-esophageal reflux disease without esophagitis: Secondary | ICD-10-CM

## 2013-01-09 DIAGNOSIS — E559 Vitamin D deficiency, unspecified: Secondary | ICD-10-CM

## 2013-01-09 DIAGNOSIS — E663 Overweight: Secondary | ICD-10-CM

## 2013-01-09 DIAGNOSIS — J45909 Unspecified asthma, uncomplicated: Secondary | ICD-10-CM

## 2013-01-09 DIAGNOSIS — J309 Allergic rhinitis, unspecified: Secondary | ICD-10-CM

## 2013-01-09 LAB — LIPID PANEL
Cholesterol: 208 mg/dL — ABNORMAL HIGH (ref 0–200)
HDL: 43.3 mg/dL (ref >39.00)
Total CHOL/HDL Ratio: 5
VLDL: 35.8 mg/dL (ref 0.0–40.0)

## 2013-01-09 LAB — URINALYSIS
Ketones, ur: NEGATIVE
Specific Gravity, Urine: 1.015 (ref 1.000–1.030)
Urine Glucose: NEGATIVE
pH: 7.5 (ref 5.0–8.0)

## 2013-01-09 LAB — LDL CHOLESTEROL, DIRECT: Direct LDL: 144.6 mg/dL

## 2013-01-09 LAB — HEPATIC FUNCTION PANEL
ALT: 25 U/L (ref 0–35)
Bilirubin, Direct: 0 mg/dL (ref 0.0–0.3)
Total Bilirubin: 0.6 mg/dL (ref 0.3–1.2)

## 2013-01-09 LAB — CBC WITH DIFFERENTIAL/PLATELET
Basophils Relative: 1.1 % (ref 0.0–3.0)
Eosinophils Absolute: 0.4 10*3/uL (ref 0.0–0.7)
Eosinophils Relative: 6.1 % — ABNORMAL HIGH (ref 0.0–5.0)
HCT: 41.2 % (ref 36.0–46.0)
Hemoglobin: 14.3 g/dL (ref 12.0–15.0)
Lymphs Abs: 2.2 10*3/uL (ref 0.7–4.0)
MCHC: 34.8 g/dL (ref 30.0–36.0)
MCV: 85.3 fl (ref 78.0–100.0)
Monocytes Absolute: 0.5 10*3/uL (ref 0.1–1.0)
Neutro Abs: 3.2 10*3/uL (ref 1.4–7.7)
RBC: 4.82 Mil/uL (ref 3.87–5.11)

## 2013-01-09 LAB — BASIC METABOLIC PANEL
Chloride: 104 mEq/L (ref 96–112)
Creatinine, Ser: 0.6 mg/dL (ref 0.4–1.2)
GFR: 111.45 mL/min (ref >60.00)

## 2013-01-09 NOTE — Patient Instructions (Addendum)
Today we updated your med list in our EPIC system...    Continue your current medications the same...  Today we did your follow up FASTING blood work...    We will contact you w/ the results when avail...  Call for any questions or if we can be of service in any way...  Let's plan a routine follow up visit in 71yr.Carrie KitchenMarland Perkins

## 2013-01-09 NOTE — Progress Notes (Signed)
Subjective:    Patient ID: Carrie Perkins, female    DOB: [birth year], 62 y.o.   MRN: [id number]  HPI 62 y/o WF here for a follow up visit... she is the daughter of Carrie Perkins, a former pt of ours, and we see her for general med purposes...  ~  December 12, 2011:  30mo ROV & Carrie Perkins is doing quite well- on diet & exercising w/ a trainer, wt down 5# so far & she is encouraged; requests 2012 Flu vaccine & Rx for shingles shot;  See prob list below>>     She remains on Symbicort 160bid along w/ Zyrtek/ Astelin/ Mucinex- and doing well w/o much cough, sputum, SOB, etc...    Also due for FASTING blood work to recheck FLP, DM labs, etc> (see below)    Taking Calcium, MVI, Vit D from DrTomblin...   ~  February 03, 2012:  135mo ROV & she is c/o persistent/ recurrent URI symptoms w/ head & chest congestion> states it started w/ sore throat, sinus drainage to chest, cough w/ yellow sput, given SeptraDS which helped, then symptoms returned & given Levaquin, improved again then recurred once more; now taking Mucinex, phlegm is thick beige, continued sinus draining, ears stopped up, chest congested, dry cough, min SOB, no f/c/s etc...  We discussed Rx w/ Depo80 + Prednisone 20mg  4d tapering sched and specific treatment for Sinuses (Zyrtek Qam, Saline via Netti & mist Q1H, QNasl at bedtime) & Bronchial tubes (Symbicort160-2spBid, Mucinex-2Bid + Fluids...    CXR today shows heart size upper lim of norm, clear lungs, mild DJD in spine...  If not resolved we will need to consider ENT eval...  ~  June 21, 2012:  20mo ROV & Carrie Perkins reports that she is seeing Musician for plantar fasciitis & taking Benedryl for mosquito bites on her legs;  She has been eating better she tells me & exercising w/ a personal trainer & now going to the "fit station" for healthy meals... Weight is 207# by our scale (down 3#).Marland KitchenMarland Kitchen We reviewed prob list, meds, xrays and labs> see below for updates >>   ~  January 09, 2013:  35mo ROV & Carrie Perkins  has had a good interval, no new complaints or concerns; she is trying to diet & working w/ a Systems analyst; due for fasting blood work today; We reviewed the following medical problems during today's office visit >>     OSA> she had sleep study 2008 w/ AHI=22 & desat tp 80%; she declined CPAP etc & decided to try wt loss but has in fact gained wt; she now denies sleep disordered breathing, daytime hypersomnolence, etc...    AR, Asthma> on Zyrtek, Astelin, Flonase, Symbicort160; she denies resp exac, no recent infections, and no cough/ sput/ hemoptysis, SOB, etc...    Chol> on diet & exercise; FLP 2/14 shows TChol 208, TG 179, HDL 43, LDL 145; not at goals & wt is up- rec to start Simva40...    Hx Impaired Fasting Gluc> on diet alone; Labs 2/14 showed BS=119, A1c=5.9; Advised on diet, exercise, wt reduction strategies...    Overwt> wt=210# & she has been unable to lose wt; states she is working w/ a Psychologist, educational now (hard to exerc due to right knee) & is encouraged...    GI- GERD> on Prilosec20 OTC as needed; states symptoms diminished w/ chewing gum; last colon was 2006 & neg- f/u due 2016...    Urin incont> eval & rx by GYN DrTomblin; we do not have  notes to review; she reports prev better on Toviaz...    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  We reviewed prob list, meds, xrays and labs> see below for updates >>  LABS 2/14:  FLP- not at goals on diet alone;  Chems- ok x BS=119, A1c=5.9;  CBC- wnl;  TSH=1.34;  VitD=39;  UA- clear...          Problem List:  PHYSICAL EXAMINATION (ICD-V70.0) - her GYN= DrTomblin for PAP, Mammograms, & hasn't had BMD yet and she will discuss w/ him... on ASA 81mg /d, MVI & Vit D 1000 u daily (Vit D level 1/13 = 48).  ALLERGIC RHINITIS (ICD-477.9) - states that she had allergy eval by DrYoung but can't find this in old paper chart... she  decided against allergy shots and uses Zyrtek, Mucinex, Nasal irrigations, & Astepro Prn... Plus MMW as needed for throat.  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 she was given option of weight loss, oral appliance, CPAP and she chose weight reduction- but weight has been betw 195 - 198 for yrs, now 207# but she denies any worsening sleep problems, daytime hypersom symptoms, snoring, etc... States she rests well, wakes refreshed, & husb does not c/o her snoring.  ASTHMA (ICD-493.90) - she has RADS and is treated w/ SYMBICORT 160- 2spBid, MUCINEX Bid, Hycodan, etc... she denies recent infectious exac...  ~  baseline CXR= clear, WNL.Marland Kitchen. ~  PFT 3/08 showed FVC= 2.72 (80%), FEV1= 2.17 (79%), %1sec= 80, mid-flows= 77% pred... ~  f/u CXR 6/11 is clear, WNL.Marland Kitchen. 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 ~  2/14: on Zyrtek, Astelin, Flonase, Symbicort160; she denies resp exac, no recent infections, and no cough/ sput/ hemoptysis, SOB, etc.  HYPERCHOLESTEROLEMIA, BORDERLINE (ICD-272.4) - on Diet & exercise alone, she has declined med rx... ~  labs from DrTomblin's office 2006 showed TChol 194, TG 167, HDL 53, LDL 108... he rec "f/u w/ PCP" ~  FLP 6/10 on diet alone showed TChol 164, TG 141, HDL 43, LDL 93 ~  FLP 6/11 on diet alone showed TChol 228, TG 216, HDL 43, LDL 157... she does not want meds! ~  FLP 1/13 on diet alone showed TChol 207, TG 159, HDL 47, LDL 140... Sl improved, keep up the good work. ~  FLP 2/14 on diet alone showed TChol 208, TG 179, HDL 43, LDL 145; not at goals & wt is up- rec to start Simva40...  OTHER ABNORMAL GLUCOSE (ICD-790.29) - on Diet & exercise program... ~  labs are from DrToblin's office 2006 showed FBS= 110, A1c= 5.4, and rec for pt to "f/u w/ PCP" ~  labs 6/10 showed BS= 100, A1c= 5.8 ~  labs 6/11 showed BS= 119 & she is  reminded to get wt down or start meds. ~  Labs 1/13 showed BS= 102, A1c= 6.0 ~  Labs 2/14 on diet alone showed BS= 119, A1c= 5.9  OVERWEIGHT (ICD-278.02) - we discussed diet, exercise, & wt reduction program... ~  weight 6/10 = 195# ~  weight 6/11 = 204#... We reviewed diet prescription. ~  weight 2/12 = 214#... she will start a "veggie diet". ~  Weight 6/12 = 212#... rec wt watchers  or similar group. ~  Weight 1/13 = 207# ~  Weight 8/13 = 207# ~  Weight 2/14 = 210#  GASTROESOPHAGEAL REFLUX DISEASE (ICD-530.81) - hx of GERD symptoms prev treated w/ Protonix... She now uses "chewing gum" she says & reminded to use Prilosec OTC if needed...  COLONOSCOPY - she states that DrTomblin sent her to [facility] for routine colonoscopy ~2006 and pt reports that this procedure was neg.  URINARY INCONTINENCE (ICD-788.30) - she notes that DrTomblin knows all about this and they are working on it> considering sling surgery... but some of her symptoms suggest overactive bladder & we decided to try TOVIAZ 8mg  as trial (it helped, she says- off now & doing well).  DEGENERATIVE JOINT DISEASE (ICD-715.90) - she had TKR surg in the past & a fractured wrist requiring ORIF, but denies any current ortho problems... ~  Tried on [facility] 7.5mg  daily as needed... ~  2/14: on Mobic7.5; she had left knee surg at ~2005; now w/ right knee pain & [facility] has suggested right knee surg but she is holding off (offered 2nd opinion in [place])...    Health Maintenance >> ~  GI:  she reports neg colonoscopy by [facility] 2006 ~  GYN:  she is followed by DrTomblin ~  Immunizations:  She requests written Rx for Shingles vaccine.   Past Surgical History  Procedure Laterality Date  . Total knee arthroplasty    . Tubal ligation    . Left wrist orif after fall  2008    Outpatient Encounter Prescriptions as of 01/09/2013  Medication Sig Dispense Refill  . aspirin 81 MG EC tablet Take 81 mg by mouth daily.        Marland Kitchen azelastine  (ASTELIN) 137 MCG/SPRAY nasal spray Place 1 spray into the nose 2 (two) times daily. Use in each nostril as directed       . Beclomethasone Dipropionate (QNASL) 80 MCG/ACT AERS Place 2 sprays into the nose at bedtime.  8.7 g  6  . budesonide-formoterol (SYMBICORT) 160-4.5 MCG/ACT inhaler Inhale 2 puffs into the lungs 2 (two) times daily.        . calcium citrate (CALCITRATE - DOSED IN MG ELEMENTAL CALCIUM) 950 MG tablet Take 1 tablet by mouth daily.        . cetirizine (ZYRTEC) 10 MG chewable tablet Chew 10 mg by mouth daily.        . Cholecalciferol 1000 UNITS capsule Take 1,000 Units by mouth daily.        Marland Kitchen dextromethorphan-guaiFENesin (MUCINEX DM) 30-600 MG per 12 hr tablet Take 2 tablets by mouth every 12 (twelve) hours.       . meloxicam (MOBIC) 7.5 MG tablet Take 1 to 2 tablets by mouth daily      . Multiple Vitamin (MULTIVITAMIN) capsule Take 1 capsule by mouth daily.         No facility-administered encounter medications on file as of 01/09/2013.    Allergies  Allergen Reactions  . Amoxicillin-Pot Clavulanate     REACTION: severe abd pain---intolerant    Review of Systems        See HPI - all other systems neg except as noted... The patient complains of decreased hearing and dyspnea on exertion.  The patient denies anorexia, fever, weight loss, weight gain, vision loss, hoarseness, chest pain, syncope, peripheral edema, prolonged cough, headaches, hemoptysis, abdominal pain, melena, hematochezia, severe indigestion/heartburn, hematuria, incontinence, muscle weakness, suspicious skin lesions, transient blindness, difficulty walking, depression, unusual weight change, abnormal bleeding, enlarged  lymph nodes, and angioedema.     Objective:   Physical Exam    WD, sl overweight, 62 y/o WF in NAD...  GENERAL:  Alert & oriented; pleasant & cooperative... HEENT:  /AT, EOM-wnl, PERRLA, Fundi-benign, EACs- sl red, TMs-wnl, NOSE- sl red/ inflammed, THROAT- injected, otherw wnl. NECK:   Supple w/ full ROM; no JVD; normal carotid impulses w/o bruits; no thyromegaly or nodules palpated; no lymphadenopathy. CHEST:  Clear to P & A; without wheezes/ rales/ or rhonchi. HEART:  Regular Rhythm; without murmurs/ rubs/ or gallops. ABDOMEN:  Soft & nontender; normal bowel sounds; no organomegaly or masses detected. EXT: without deformities, mild arthritic changes; no varicose veins/ venous insuffic/ or edema. NEURO:  CN's intact; motor testing normal; sensory testing normal; gait normal & balance OK. DERM:  No lesions noted; no rash etc...  RADIOLOGY DATA:  Reviewed in the EPIC EMR & discussed w/ the patient...     LABORATORY DATA:  Reviewed in the EPIC EMR & discussed w/ the patient...   Assessment & Plan:    AR & ASTHMA w/ refractory AB exac>  No recent exac & stable on Symbicort...  OSA>  She denies sleep disordered breathing, daytime hypersomnolence, etc; She is advised to get on diet + exercise program & get wt down to avoid future problems...  CHOL>  She has intermittently shown excellent control on diet Rx, then again off diet numbers looked pretty bad; FLP now w/ LDL=145 & suggested to incr   Abn BS>  BS & A1c look good on diet> everything depends on better diet, exercise, get wt down...  GI>  On OTC Prilosec as needed & f/u colon due 2016...  GYN/ Urine incont>  Followed by DrTomblin; Toviaz helped & she is off this med now, doing satis...  DJD>  Aware, uses OTC meds as needed...   Patient's Medications  New Prescriptions   SIMVASTATIN (ZOCOR) 40 MG TABLET    Take 1 tablet (40 mg total) by mouth every evening.  Previous Medications   ASPIRIN 81 MG EC TABLET    Take 81 mg by mouth daily.     AZELASTINE (ASTELIN) 137 MCG/SPRAY NASAL SPRAY    Place 1 spray into the nose 2 (two) times daily. Use in each nostril as directed    BECLOMETHASONE DIPROPIONATE (QNASL) 80 MCG/ACT AERS    Place 2 sprays into the nose at bedtime.   BUDESONIDE-FORMOTEROL (SYMBICORT) 160-4.5  MCG/ACT INHALER    Inhale 2 puffs into the lungs 2 (two) times daily.     CALCIUM CITRATE (CALCITRATE - DOSED IN MG ELEMENTAL CALCIUM) 950 MG TABLET    Take 1 tablet by mouth daily.     CETIRIZINE (ZYRTEC) 10 MG CHEWABLE TABLET    Chew 10 mg by mouth daily.     CHOLECALCIFEROL 1000 UNITS CAPSULE    Take 1,000 Units by mouth daily.     DEXTROMETHORPHAN-GUAIFENESIN (MUCINEX DM) 30-600 MG PER 12 HR TABLET    Take 2 tablets by mouth every 12 (twelve) hours.    MELOXICAM (MOBIC) 7.5 MG TABLET    Take 1 to 2 tablets by mouth daily   MULTIPLE VITAMIN (MULTIVITAMIN) CAPSULE    Take 1 capsule by mouth daily.    Modified Medications   No medications on file  Discontinued Medications   No medications on file

## 2013-01-17 ENCOUNTER — Telehealth: Payer: Self-pay | Admitting: Pulmonary Disease

## 2013-01-17 MED ORDER — SIMVASTATIN 40 MG PO TABS: 40.0000 mg | ORAL_TABLET | Freq: Every evening | ORAL | Status: DC

## 2013-01-17 NOTE — Telephone Encounter (Signed)
Spoke with patient informed her of recs/results as listed below. Patient verbalized understanding, simvastatin 40mg  daily called into patients pharmacy. Nothing further needed at this time.    Result Note    Please notify patient>    FLP not at goals on diet alone; LDL=145, goal<100; I Rec start Simva40 (cost $4/mo)- she has refused meds in the past...   Chems, CBC, Thyroid, urine> all essentially wnl x BS=119 (A1c is normal at 5.9); Rec low carb wt reducing diet!!!]   Vit D is low end at 39... Needs incr vit d to 2000u OTC daily.Marland KitchenMarland Kitchen

## 2013-01-17 NOTE — Progress Notes (Signed)
Quick Note:  Spoke with patient informed her of recs/results as listed below. Patient verbalized understanding, simvastatin 40mg  daily called into patients pharmacy. Nothing further needed at this time. ______

## 2013-04-11 DIAGNOSIS — Z96659 Presence of unspecified artificial knee joint: Secondary | ICD-10-CM | POA: Insufficient documentation

## 2013-05-14 HISTORY — PX: MEDIAL PARTIAL KNEE REPLACEMENT: SHX5965

## 2013-09-19 ENCOUNTER — Other Ambulatory Visit: Payer: Self-pay

## 2013-12-26 ENCOUNTER — Telehealth: Payer: Self-pay | Admitting: Pulmonary Disease

## 2013-12-26 MED ORDER — LEVOFLOXACIN 500 MG PO TABS: 500.0000 mg | ORAL_TABLET | Freq: Every day | ORAL | Status: DC

## 2013-12-26 NOTE — Telephone Encounter (Signed)
Spoke with pt. Reports nasal congestion, runny nose, headache and slight coughing x4 days. Mucus is yellow in color. Has been using Netti pot, Zyrtec and Mucinex with minimal relief. Pt is going to be flying to [place] and doesn't want to make her symptoms worse. Would like something called in.  Allergies  Allergen Reactions  . Amoxicillin-Pot Clavulanate     REACTION: severe abd pain---intolerant   SN - please advise. Thanks.

## 2013-12-26 NOTE — Telephone Encounter (Signed)
Pt advised and rx sent. Rawad Bochicchio, CMA  

## 2013-12-26 NOTE — Telephone Encounter (Signed)
Per SN---  levaquin 500 mg  #7  1 daily Align 1 daily mucinex 600 mg  2 po bid Increase fluids

## 2014-01-08 ENCOUNTER — Encounter: Payer: Self-pay | Admitting: Pulmonary Disease

## 2014-01-08 ENCOUNTER — Ambulatory Visit (INDEPENDENT_AMBULATORY_CARE_PROVIDER_SITE_OTHER)
Admission: RE | Admit: 2014-01-08 | Discharge: 2014-01-08 | Disposition: A | Discharge status: Home / Self Care | Payer: BC Managed Care – PPO | Source: Ambulatory Visit | Attending: Pulmonary Disease | Admitting: Pulmonary Disease

## 2014-01-08 ENCOUNTER — Ambulatory Visit (INDEPENDENT_AMBULATORY_CARE_PROVIDER_SITE_OTHER): Payer: BC Managed Care – PPO | Admitting: Pulmonary Disease

## 2014-01-08 ENCOUNTER — Other Ambulatory Visit (INDEPENDENT_AMBULATORY_CARE_PROVIDER_SITE_OTHER): Payer: BC Managed Care – PPO

## 2014-01-08 VITALS — BP 140/82 | HR 76 | Temp 97.9°F | Ht 67.0 in | Wt 217.6 lb

## 2014-01-08 DIAGNOSIS — K219 Gastro-esophageal reflux disease without esophagitis: Secondary | ICD-10-CM

## 2014-01-08 DIAGNOSIS — J45909 Unspecified asthma, uncomplicated: Secondary | ICD-10-CM

## 2014-01-08 DIAGNOSIS — Z0000 Encounter for general adult medical examination without abnormal findings: Secondary | ICD-10-CM

## 2014-01-08 DIAGNOSIS — M1990 Unspecified osteoarthritis, unspecified site: Secondary | ICD-10-CM

## 2014-01-08 DIAGNOSIS — E785 Hyperlipidemia, unspecified: Secondary | ICD-10-CM

## 2014-01-08 DIAGNOSIS — J309 Allergic rhinitis, unspecified: Secondary | ICD-10-CM

## 2014-01-08 DIAGNOSIS — E663 Overweight: Secondary | ICD-10-CM

## 2014-01-08 DIAGNOSIS — R7309 Other abnormal glucose: Secondary | ICD-10-CM

## 2014-01-08 LAB — CBC WITH DIFFERENTIAL/PLATELET
Basophils Absolute: 0.1 10*3/uL (ref 0.0–0.1)
Basophils Relative: 0.9 % (ref 0.0–3.0)
EOS PCT: 3.9 % (ref 0.0–5.0)
Eosinophils Absolute: 0.3 10*3/uL (ref 0.0–0.7)
HEMATOCRIT: 43.6 % (ref 36.0–46.0)
Hemoglobin: 14.6 g/dL (ref 12.0–15.0)
LYMPHS ABS: 2.6 10*3/uL (ref 0.7–4.0)
LYMPHS PCT: 34.2 % (ref 12.0–46.0)
MCHC: 33.4 g/dL (ref 30.0–36.0)
MCV: 89.1 fl (ref 78.0–100.0)
Monocytes Absolute: 0.5 10*3/uL (ref 0.1–1.0)
Monocytes Relative: 6.5 % (ref 3.0–12.0)
NEUTROS PCT: 54.5 % (ref 43.0–77.0)
Neutro Abs: 4.1 10*3/uL (ref 1.4–7.7)
Platelets: 316 10*3/uL (ref 150.0–400.0)
RBC: 4.9 Mil/uL (ref 3.87–5.11)
RDW: 13.9 % (ref 11.5–14.6)
WBC: 7.5 10*3/uL (ref 4.5–10.5)

## 2014-01-08 LAB — BASIC METABOLIC PANEL
BUN: 12 mg/dL (ref 6–23)
CO2: 29 mEq/L (ref 19–32)
Calcium: 9.3 mg/dL (ref 8.4–10.5)
Chloride: 105 mEq/L (ref 96–112)
Creatinine, Ser: 0.6 mg/dL (ref 0.4–1.2)
GFR: 111.1 mL/min (ref >60.00)
Glucose, Bld: 114 mg/dL — ABNORMAL HIGH (ref 70–99)
Potassium: 4 mEq/L (ref 3.5–5.1)
Sodium: 139 mEq/L (ref 135–145)

## 2014-01-08 LAB — HEPATIC FUNCTION PANEL
ALT: 24 U/L (ref 0–35)
AST: 18 U/L (ref 0–37)
Albumin: 3.9 g/dL (ref 3.5–5.2)
Alkaline Phosphatase: 60 U/L (ref 39–117)
Bilirubin, Direct: 0 mg/dL (ref 0.0–0.3)
Total Bilirubin: 0.6 mg/dL (ref 0.3–1.2)
Total Protein: 6.9 g/dL (ref 6.0–8.3)

## 2014-01-08 LAB — LDL CHOLESTEROL, DIRECT: LDL DIRECT: 147.7 mg/dL

## 2014-01-08 LAB — LIPID PANEL
Cholesterol: 229 mg/dL — ABNORMAL HIGH (ref 0–200)
HDL: 54.8 mg/dL (ref >39.00)
Total CHOL/HDL Ratio: 4
Triglycerides: 234 mg/dL — ABNORMAL HIGH (ref 0.0–149.0)
VLDL: 46.8 mg/dL — ABNORMAL HIGH (ref 0.0–40.0)

## 2014-01-08 LAB — TSH: TSH: 1.16 u[IU]/mL (ref 0.35–5.50)

## 2014-01-08 LAB — HEMOGLOBIN A1C: HEMOGLOBIN A1C: 6 % (ref 4.6–6.5)

## 2014-01-08 NOTE — Progress Notes (Signed)
Subjective:    Patient ID: Carrie Perkins, female    DOB: 1950-11-17, 63 y.o.   MRN: [id number]  HPI 63 y/o WF here for a follow up visit... she is the daughter of Carrie Perkins, a former pt of ours, and we see her for general med purposes...  ~  December 12, 2011:  63mo ROV & [name] is doing quite well- on diet & exercising w/ a trainer, wt down 5# so far & she is encouraged; requests 2012 Flu vaccine & Rx for shingles shot;  See prob list below>>     She remains on Symbicort 160bid along w/ Zyrtek/ Astelin/ Mucinex- and doing well w/o much cough, sputum, SOB, etc...    Also due for FASTING blood work to recheck FLP, DM labs, etc> (see below)    Taking Calcium, MVI, Vit D from DrTomblin...   ~  February 03, 2012:  103mo ROV & she is c/o persistent/ recurrent URI symptoms w/ head & chest congestion> states it started w/ sore throat, sinus drainage to chest, cough w/ yellow sput, given SeptraDS which helped, then symptoms returned & given Levaquin, improved again then recurred once more; now taking Mucinex, phlegm is thick beige, continued sinus draining, ears stopped up, chest congested, dry cough, min SOB, no f/c/s etc...  We discussed Rx w/ Depo80 + Prednisone 20mg  4d tapering sched and specific treatment for Sinuses (Zyrtek Qam, Saline via Netti & mist Q1H, QNasl at bedtime) & Bronchial tubes (Symbicort160-2spBid, Mucinex-2Bid + Fluids...  CXR today shows heart size upper lim of norm, clear lungs, mild DJD in spine...  If not resolved we will need to consider ENT eval...  ~  June 21, 2012:  56mo ROV & Carrie Perkins reports that she is seeing Occupational hygienist for plantar fasciitis & taking Benedryl for mosquito bites on her legs;  She has been eating better she tells me & exercising w/ a personal trainer & now going to the "fit station" for healthy meals... Weight is 207# by our scale (down 3#).Marland KitchenMarland Kitchen We reviewed prob list, meds, xrays and labs> see below for updates >>   ~  January 09, 2013:  69mo ROV & Carrie Perkins  has had a good interval, no new complaints or concerns; she is trying to diet & working w/ a Physiological scientist; due for fasting blood work today; We reviewed the following medical problems during today's office visit >>     OSA> she had sleep study 2008 w/ AHI=22 & desat tp 80%; she declined CPAP etc & decided to try wt loss but has in fact gained wt; she now denies sleep disordered breathing, daytime hypersomnolence, etc...    AR, Asthma> on Zyrtek, Astelin, Flonase, Symbicort160; she denies resp exac, no recent infections, and no cough/ sput/ hemoptysis, SOB, etc...    Chol> on diet & exercise; FLP 2/14 shows TChol 208, TG 179, HDL 43, LDL 145; not at goals & wt is up- rec to start Simva40...    Hx Impaired Fasting Gluc> on diet alone; Labs 2/14 showed BS=119, A1c=5.9; Advised on diet, exercise, wt reduction strategies...    Overwt> wt=210# & she has been unable to lose wt; states she is working w/ a Clinical research associate now (hard to exerc due to right knee) & is encouraged...    GI- GERD> on Prilosec20 OTC as needed; states symptoms diminished w/ chewing gum; last colon was 2006 & neg- f/u due 2016...    Urin incont> eval & rx by GYN DrTomblin; we do not have notes to  review; she reports prev better on Toviaz...    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  We reviewed prob list, meds, xrays and labs> see below for updates >>   LABS 2/14:  FLP- not at goals on diet alone;  Chems- ok x BS=119, A1c=5.9;  CBC- wnl;  TSH=1.34;  VitD=39;  UA- clear...  ~  January 08, 2014:  Yearly ROV & CPX> Carrie Perkins reports a good yr- she had knee surg 7/14, recent trip to [place] & wt is up 9# to 218# (BMI=34), no new complaints or concerns... We reviewed the following medical problems during today's office visit >>     OSA> she had sleep study 2008 w/ AHI=22 & desat tp 80%; she declined CPAP etc &  decided to try wt loss but has in fact gained wt; she now denies sleep disordered breathing, daytime hypersomnolence, etc...    AR, Asthma> on Zyrtek, Astelin, Flonase, Symbicort160 (only using prn); she denies resp exac, no recent infections, and no cough/ sput/ hemoptysis, SOB, etc...    Chol> on diet & exercise, she never filled the Simva40, tried Niacin OTC & RYR; FLP 2/15 shows TChol 229, TG 234, HDL 55 LDL 148; not at goals & AGAIN rec to start Simva40...    Hx Impaired Fasting Gluc> on diet alone; Labs 2/15 showed BS=114, A1c=6.0; Advised on diet, exercise, wt reduction strategies...    Overwt> wt=218# & she has been unable to lose wt; states she is working w/ a Clinical research associate now (hard to exerc due to right knee) & is encouraged...    GI- GERD> on Prilosec20 OTC as needed; states symptoms diminished w/ chewing gum; last colon was 2006 & neg- f/u due 2016...    Gyn, Urin incont> eval & rx by GYN DrTomblin; we do not have notes to review; she reports prev better on Toviaz, may get another Gyn opinion...    DJD> on Mobic7.5; she had left knee surg at ~2005; now w/ right knee pain & [facility] has suggested right knee surg but she is holding off => now states she had left knee surg 7/14 (we don't have any records. We reviewed prob list, meds, xrays and labs> see below for updates >> she has refused Pneumonia vaccine and Flu shots...  CXR 2/15 showed norm heart size, clear lungs, NAD.Marland KitchenMarland Kitchen  EKG 2/15 showed NSR, rate69, poor R progression V1-5, NSSTTWA...   LABS 2/15:  FLP- not at goals on diet, rec to start Simva40;  Chems- ok on diet alone w/ BS=114, A1c=6.0;  CBC- wnl;  TSH=1.16...           Problem List:  PHYSICAL EXAMINATION (ICD-V70.0) - her GYN= DrTomblin for PAP, Mammograms, & hasn't had BMD yet and she will discuss w/ him... on ASA 81mg /d, MVI & Vit D 1000 u daily (Vit D level 1/13 = 48).  ALLERGIC RHINITIS (ICD-477.9) - states that she had allergy eval by DrYoung but can't find this in old  paper chart... she decided against allergy shots and uses Zyrtek, Mucinex, Nasal irrigations, & Astepro Prn... Plus MMW as needed for throat.  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 she was given option of weight loss, oral appliance, CPAP and she chose weight reduction- but weight  has been betw 195 - 198 for yrs, now 207# but she denies any worsening sleep problems, daytime hypersom symptoms, snoring, etc... States she rests well, wakes refreshed, & husb does not c/o her snoring.  ASTHMA (ICD-493.90) - she has RADS and is treated w/ SYMBICORT 160- 2spBid, MUCINEX Bid, Hycodan, etc... she denies recent infectious exac...  ~  baseline CXR= clear, WNL.Marland Kitchen. ~  PFT 3/08 showed FVC= 2.72 (80%), FEV1= 2.17 (79%), %1sec= 80, mid-flows= 77% pred... ~  f/u CXR 6/11 is clear, WNL.Marland Kitchen. 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 ~  2/14: on Zyrtek, Astelin, Flonase, Symbicort160; she denies resp exac, no recent infections, and no cough/ sput/ hemoptysis, SOB, etc. ~  2/15: on Zyrtek, Astelin, Flonase, Symbicort160 (only using prn); she denies resp exac, no recent infections, and no cough/ sput/ hemoptysis, SOB, etc  HYPERCHOLESTEROLEMIA, BORDERLINE (ICD-272.4) - on Diet & exercise alone, she has declined med rx... ~  labs from DrTomblin's office 2006 showed TChol 194, TG 167, HDL 53, LDL 108... he rec "f/u w/ PCP" ~  FLP 6/10 on diet alone showed TChol 164, TG 141, HDL 43, LDL 93 ~  FLP 6/11 on diet alone showed TChol 228, TG 216, HDL 43, LDL 157... she does not want meds! ~  [place] 1/13 on diet alone showed TChol 207, TG 159, HDL 47, LDL 140... Sl improved, keep up the good work. ~  [place] 2/14 on diet alone showed TChol 208, TG 179, HDL 43, LDL 145; not at goals & wt is up- rec to start Simva40=> she never did. ~  FLP 2/15 on diet alone showed TChol 229, TG 234, HDL 55  LDL 148; not at goals & AGAIN rec to start Simva40...  OTHER ABNORMAL GLUCOSE (ICD-790.29) - on Diet & exercise program... ~  labs are from DrToblin's office 2006 showed FBS= 110, A1c= 5.4, and rec for pt to "f/u w/ PCP" ~  labs 6/10 showed BS= 100, A1c= 5.8 ~  labs 6/11 showed BS= 119 & she is reminded to get wt down or start meds. ~  Labs 1/13 showed BS= 102, A1c= 6.0 ~  Labs 2/14 on diet alone showed BS= 119, A1c= 5.9 ~  Labs 2/15 on diet alone showed BS= 114, A1c= 6.0  OVERWEIGHT (ICD-278.02) - we discussed diet, exercise, & wt reduction program... ~  weight 6/10 = 195# ~  weight 6/11 = 204#... We reviewed diet prescription. ~  weight 2/12 = 214#... she will start a "veggie diet". ~  Weight 6/12 = 212#... rec wt watchers or similar group. ~  Weight 1/13 = 207# ~  Weight 8/13 = 207# ~  Weight 2/14 = 210# ~  Weight 2/15 = 218#  GASTROESOPHAGEAL REFLUX DISEASE (ICD-530.81) - hx of GERD symptoms prev treated w/ Protonix... She now uses "chewing gum" she says & reminded to use Prilosec OTC if needed...  COLONOSCOPY - she states that DrTomblin sent her to [facility] for routine colonoscopy ~2006 and pt reports that this procedure was neg.  URINARY INCONTINENCE (ICD-788.30) - she notes that DrTomblin knows all about this and they are working on it> considering sling surgery... but some of her symptoms suggest overactive bladder & we decided to try TOVIAZ 8mg  as trial (it helped, she says- off now & doing well).  DEGENERATIVE JOINT DISEASE (ICD-715.90) - she had TKR surg in the past & a fractured wrist requiring ORIF, but denies any current ortho problems... ~  [place]  on MOBIC 7.5mg  daily as needed... ~  2/14: on Mobic7.5; she had left knee surg at ~2005; now w/ right knee pain & [facility] has suggested right knee surg but she is holding off (offered 2nd opinion in [place])...    Health Maintenance >> ~  GI:  she reports neg colonoscopy by [facility] 2006 ~  GYN:  she is followed by  DrTomblin ~  Immunizations:  She requests written Rx for Shingles vaccine.   Past Surgical History  Procedure Laterality Date  . Total knee arthroplasty    . Tubal ligation    . Left wrist orif after fall  2008  . Medial partial knee replacement Right 05/2013    done at [facility]---Dr. Palin    Outpatient Encounter Prescriptions as of 01/08/2014  Medication Sig  . aspirin 81 MG EC tablet Take 81 mg by mouth daily.    Marland Kitchen azelastine (ASTELIN) 137 MCG/SPRAY nasal spray Place 1 spray into the nose 2 (two) times daily. Use in each nostril as directed   . Beclomethasone Dipropionate (QNASL) 80 MCG/ACT AERS Place 2 sprays into the nose at bedtime.  . budesonide-formoterol (SYMBICORT) 160-4.5 MCG/ACT inhaler Inhale 2 puffs into the lungs 2 (two) times daily.    . calcium citrate (CALCITRATE - DOSED IN MG ELEMENTAL CALCIUM) 950 MG tablet Take 1 tablet by mouth daily.    . cetirizine (ZYRTEC) 10 MG chewable tablet Chew 10 mg by mouth daily.   . Cholecalciferol 1000 UNITS capsule Take 1,000 Units by mouth daily.    Marland Kitchen dextromethorphan-guaiFENesin (MUCINEX DM) 30-600 MG per 12 hr tablet Take 2 tablets by mouth every 12 (twelve) hours.   . Multiple Vitamin (MULTIVITAMIN) capsule Take 1 capsule by mouth daily.    . simvastatin (ZOCOR) 40 MG tablet Take 1 tablet (40 mg total) by mouth every evening.  . [DISCONTINUED] levofloxacin (LEVAQUIN) 500 MG tablet Take 1 tablet (500 mg total) by mouth daily.  . [DISCONTINUED] meloxicam (MOBIC) 7.5 MG tablet Take 1 to 2 tablets by mouth daily    Allergies  Allergen Reactions  . Amoxicillin-Pot Clavulanate     REACTION: severe abd pain---intolerant    Review of Systems        See HPI - all other systems neg except as noted... The patient complains of decreased hearing and dyspnea on exertion.  The patient denies anorexia, fever, weight loss, weight gain, vision loss, hoarseness, chest pain, syncope, peripheral edema, prolonged cough, headaches, hemoptysis,  abdominal pain, melena, hematochezia, severe indigestion/heartburn, hematuria, incontinence, muscle weakness, suspicious skin lesions, transient blindness, difficulty walking, depression, unusual weight change, abnormal bleeding, enlarged lymph nodes, and angioedema.     Objective:   Physical Exam    WD, sl overweight, 63 y/o WF in NAD...  GENERAL:  Alert & oriented; pleasant & cooperative... HEENT:  [place]/AT, EOM-wnl, PERRLA, Fundi-benign, EACs- sl red, TMs-wnl, NOSE- sl red/ inflammed, THROAT- injected, otherw wnl. NECK:  Supple w/ full ROM; no JVD; normal carotid impulses w/o bruits; no thyromegaly or nodules palpated; no lymphadenopathy. CHEST:  Clear to P & A; without wheezes/ rales/ or rhonchi. HEART:  Regular Rhythm; without murmurs/ rubs/ or gallops. ABDOMEN:  Soft & nontender; normal bowel sounds; no organomegaly or masses detected. EXT: without deformities, mild arthritic changes; no varicose veins/ venous insuffic/ or edema. NEURO:  CN's intact; motor testing normal; sensory testing normal; gait normal & balance OK. DERM:  No lesions noted; no rash etc...  RADIOLOGY DATA:  Reviewed in the EPIC EMR & discussed w/  the patient...     LABORATORY DATA:  Reviewed in the EPIC EMR & discussed w/ the patient...   Assessment & Plan:    AR & ASTHMA w/ refractory AB exac>  No recent exac & stable on Symbicort...  OSA>  She denies sleep disordered breathing, daytime hypersomnolence, etc; She is advised to get on diet + exercise program & get wt down to avoid future problems...  CHOL>  She has intermittently shown excellent control on diet Rx, then again off diet numbers looked pretty bad; FLP now w/ LDL=148 & suggested to start simva40.  Abn BS>  BS & A1c look good on diet> everything depends on better diet, exercise, get wt down...  GI>  On OTC Prilosec as needed & f/u colon due 2016...  GYN/ Urine incont>  Followed by DrTomblin; Toviaz helped & she is off this med now, doing  satis...  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

## 2014-01-08 NOTE — Patient Instructions (Signed)
Today we updated your med list in our EPIC system...    Continue your current medications the same...  Today we checked your follow up CXR, EKG, & FASTING blood work...    We will contact you w/ the results when available...   Let's get on track w/ our diet, exercise & weight loss program...    You can do it!!!  Call for any questions.Marland KitchenMarland Kitchen

## 2014-01-17 ENCOUNTER — Other Ambulatory Visit: Payer: Self-pay | Admitting: Pulmonary Disease

## 2014-01-17 DIAGNOSIS — Z0000 Encounter for general adult medical examination without abnormal findings: Secondary | ICD-10-CM

## 2014-01-22 ENCOUNTER — Telehealth: Payer: Self-pay | Admitting: Pulmonary Disease

## 2014-01-22 NOTE — Telephone Encounter (Signed)
Per TP:  2D Echo looks at heart function -- we need to make sure there is no decreased heart function as the EKG mentioned.  Thanks.

## 2014-01-22 NOTE — Telephone Encounter (Signed)
Pt aware. Nothing further needed 

## 2014-01-22 NOTE — Telephone Encounter (Signed)
Notes Recorded by Noralee Space, MD on 01/10/2014 at 2:54 PM Please notify patient>  EKG is abnormal w/ poor R progression & needs 2DEcho for further eval- sched this please... ---  Will get clarification from TP as too what this means. EKG printed with message as well. Please advise thanks

## 2014-02-04 ENCOUNTER — Ambulatory Visit (HOSPITAL_COMMUNITY): Payer: BC Managed Care – PPO | Attending: Cardiology | Admitting: Radiology

## 2014-02-04 ENCOUNTER — Encounter: Payer: Self-pay | Admitting: Cardiology

## 2014-02-04 DIAGNOSIS — Z0000 Encounter for general adult medical examination without abnormal findings: Secondary | ICD-10-CM | POA: Insufficient documentation

## 2014-02-04 DIAGNOSIS — R9431 Abnormal electrocardiogram [ECG] [EKG]: Secondary | ICD-10-CM | POA: Insufficient documentation

## 2014-02-04 MED ORDER — PERFLUTREN PROTEIN A MICROSPH IV SUSP
2.0000 mL | Freq: Once | INTRAVENOUS | Status: AC
  Administered 2014-02-04: 2 mL via INTRAVENOUS

## 2014-02-04 NOTE — Progress Notes (Signed)
Echocardiogram performed with optison.

## 2014-02-24 ENCOUNTER — Telehealth: Payer: Self-pay | Admitting: Pulmonary Disease

## 2014-02-24 DIAGNOSIS — R9431 Abnormal electrocardiogram [ECG] [EKG]: Secondary | ICD-10-CM

## 2014-02-24 NOTE — Telephone Encounter (Signed)
Notes Recorded by Noralee Space, MD on 02/07/2014 at 2:59 PM Please notify patient>  Prev EKG showed poor R progression;  2DEcho shows mod LVH & diastolic dysfunction=> ?etiology;  Needs Cardiology consult for abn EKG & 2D... ---  I spoke with patient about results and she verbalized understanding and had no questions. Referral placed

## 2014-03-24 ENCOUNTER — Ambulatory Visit (INDEPENDENT_AMBULATORY_CARE_PROVIDER_SITE_OTHER): Payer: BC Managed Care – PPO | Admitting: Cardiology

## 2014-03-24 ENCOUNTER — Encounter: Payer: Self-pay | Admitting: Cardiology

## 2014-03-24 VITALS — BP 140/82 | HR 106 | Ht 67.0 in | Wt 208.0 lb

## 2014-03-24 DIAGNOSIS — E785 Hyperlipidemia, unspecified: Secondary | ICD-10-CM

## 2014-03-24 DIAGNOSIS — R9431 Abnormal electrocardiogram [ECG] [EKG]: Secondary | ICD-10-CM | POA: Insufficient documentation

## 2014-03-24 NOTE — Patient Instructions (Signed)
Your physician recommends that you continue on your current medications as directed. Please refer to the Current Medication list given to you today.  Your physician has requested that you have an exercise stress myoview. For further information please visit [email or web address]. Please follow instruction sheet, as given.  Your physician recommends that you schedule a follow-up appointment As Needed

## 2014-03-24 NOTE — Progress Notes (Signed)
[place], [name], [place]  [address] Phone: [phone] Fax:  [phone]  Date:  03/24/2014   ID:  Carrie Perkins, DOB 1950-11-28, MRN [id number]  PCP:  Noralee Space, MD  Cardiologist:  Fransico Him, MD     History of Present Illness: Carrie Perkins is a 63 y.o. female with a history of OSA, GERD, dyslipidemia and obesity who presents today for evaluation of abnormal EKG.  She was noted to have poor R wave progression on an EKG and an echo was ordered.  2D echo showed normal LVF with moderate LVH and diastolic dysfunction.  She denies any chest pain, SOB, DOE, LE edema, dizziness, palpitations or syncope.     Wt Readings from Last 3 Encounters:  03/24/14 208 lb (94.348 kg)  01/08/14 217 lb 9.6 oz (98.703 kg)  01/09/13 209 lb 12.8 oz (95.165 kg)     Past Medical History  Diagnosis Date  . Allergic rhinitis, cause unspecified   . Obstructive sleep apnea (adult) (pediatric)   . Other dyspnea and respiratory abnormality   . Unspecified asthma(493.90)   . Other and unspecified hyperlipidemia   . Overweight   . Esophageal reflux   . Unspecified urinary incontinence   . Osteoarthrosis, unspecified whether generalized or localized, unspecified site     Current Outpatient Prescriptions  Medication Sig Dispense Refill  . aspirin 81 MG EC tablet Take 81 mg by mouth daily.        Marland Kitchen azelastine (ASTELIN) 137 MCG/SPRAY nasal spray Place 1 spray into the nose 2 (two) times daily. Use in each nostril as directed       . Beclomethasone Dipropionate (QNASL) 80 MCG/ACT AERS Place 2 sprays into the nose at bedtime.  8.7 g  6  . budesonide-formoterol (SYMBICORT) 160-4.5 MCG/ACT inhaler Inhale 2 puffs into the lungs 2 (two) times daily.        . cetirizine (ZYRTEC) 10 MG chewable tablet Chew 10 mg by mouth daily.       . Cholecalciferol 1000 UNITS capsule Take 1,000 Units by mouth daily.        Marland Kitchen dextromethorphan-guaiFENesin (MUCINEX DM) 30-600 MG per 12 hr tablet Take 2 tablets by  mouth every 12 (twelve) hours.       . Multiple Vitamin (MULTIVITAMIN) capsule Take 1 capsule by mouth daily.        . niacin 500 MG tablet Take 500 mg by mouth at bedtime.      . Red Yeast Rice 600 MG CAPS Take 1 capsule by mouth daily.       No current facility-administered medications for this visit.    Allergies:    Allergies  Allergen Reactions  . Amoxicillin-Pot Clavulanate     REACTION: severe abd pain---intolerant    Social History:  The patient  reports that she has never smoked. She has never used smokeless tobacco. She reports that she drinks alcohol. She reports that she does not use illicit drugs.   Family History:  The patient's family history includes Heart disease in her father; Stroke in her mother.   ROS:  Please see the history of present illness.      All other systems reviewed and negative.   PHYSICAL EXAM: VS:  BP 140/82  Pulse 106  Ht 5\' 7"  (1.702 m)  Wt 208 lb (94.348 kg)  BMI 32.57 kg/m2 Well nourished, well developed, in no acute distress HEENT: normal Neck: no JVD Cardiac:  normal S1, S2; RRR; no murmur Lungs:  clear to auscultation bilaterally, no wheezing, rhonchi or rales Abd: soft, nontender, no hepatomegaly Ext: no edema Skin: warm and dry Neuro:  CNs 2-12 intact, no focal abnormalities noted  EKG:  NSR with nonspecific T wave abnormality and poor R wave transition in the precordial leads     ASSESSMENT AND PLAN:  1. Abnormal EKG with poor R wave progression in the anterior leads but normal wall motion on echo in the anterior wall.  I suspect this is due to lead placement.  She does though have nonspecific T wave abnormality diffusely. - I will set her up for a Stress myoview to assess for ischemia  Followup with PRN pending results of stress test  Signed, Fransico Him, MD 03/24/2014 1:40 PM

## 2014-04-08 ENCOUNTER — Ambulatory Visit (HOSPITAL_COMMUNITY): Payer: BC Managed Care – PPO | Attending: Cardiology | Admitting: Radiology

## 2014-04-08 VITALS — BP 160/80 | HR 71 | Ht 67.0 in | Wt 217.0 lb

## 2014-04-08 DIAGNOSIS — R9431 Abnormal electrocardiogram [ECG] [EKG]: Secondary | ICD-10-CM

## 2014-04-08 MED ORDER — TECHNETIUM TC 99M SESTAMIBI GENERIC - CARDIOLITE
30.0000 | Freq: Once | INTRAVENOUS | Status: AC | PRN
  Administered 2014-04-08: 30 via INTRAVENOUS

## 2014-04-08 MED ORDER — TECHNETIUM TC 99M SESTAMIBI GENERIC - CARDIOLITE
10.0000 | Freq: Once | INTRAVENOUS | Status: AC | PRN
  Administered 2014-04-08: 10 via INTRAVENOUS

## 2014-04-08 NOTE — Progress Notes (Signed)
[facility] [address] [place], [place] [address] [phone]    Cardiology Nuclear Med Study  Carrie Perkins is a 63 y.o. female     MRN : [id number]     DOB: 07/21/1951  Procedure Date: 04/08/2014  Nuclear Med Background Indication for Stress Test:  Evaluation for Ischemia and Abnormal EKG History:  No known CAD, Echo 2015 EF 60-65%, Asthma Cardiac Risk Factors: Family History - CAD and Lipids  Symptoms:  None indicated   Nuclear Pre-Procedure Caffeine/Decaff Intake:  None> 12 hrs NPO After: 8:30pm   Lungs:  clear O2 Sat: 98% on room air. IV 0.9% NS with Angio Cath:  22g  IV Site: R Antecubital x 1, tolerated well IV Started by:  Irven Baltimore, RN  Chest Size (in):  40 Cup Size: D  Height: 5\' 7"  (1.702 m)  Weight:  217 lb (98.431 kg)  BMI:  Body mass index is 33.98 kg/(m^2). Tech Comments:  N/A    Nuclear Med Study 1 or 2 day study: 1 day  Stress Test Type:  Stress  Reading MD: N/A  Order Authorizing Provider:  Fransico Him, MD  Resting Radionuclide: Technetium 69m Sestamibi  Resting Radionuclide Dose: 11.0 mCi   Stress Radionuclide:  Technetium 27m Sestamibi  Stress Radionuclide Dose: 33.0 mCi           Stress Protocol Rest HR: 71 Stress HR: 139  Rest BP: 160/80 Stress BP: 237/93  Exercise Time (min): 6:00 METS: 7.0           Dose of Adenosine (mg):  n/a Dose of Lexiscan: n/a mg  Dose of Atropine (mg): n/a Dose of Dobutamine: n/a mcg/kg/min (at max HR)  Stress Test Technologist: Glade Lloyd, BS-ES  Nuclear Technologist:  Charlton Amor, CNMT     Rest Procedure:  Myocardial perfusion imaging was performed at rest 45 minutes following the intravenous administration of Technetium 84m Sestamibi. Rest ECG: NSR with non-specific ST-T wave changes  Stress Procedure:  The patient exercised on the treadmill utilizing the Bruce Protocol for 6:00 minutes. The patient stopped due to fatigue and denied any chest pain.  Technetium 84m  Sestamibi was injected at peak exercise and myocardial perfusion imaging was performed after a brief delay. Stress ECG: No significant change from baseline ECG  QPS Raw Data Images:  Normal; no motion artifact; normal heart/lung ratio. Stress Images:  Normal homogeneous uptake in all areas of the myocardium. Rest Images:  Normal homogeneous uptake in all areas of the myocardium. Subtraction (SDS):  No evidence of ischemia. Transient Ischemic Dilatation (Normal <1.22):  1.00 Lung/Heart Ratio (Normal <0.45):  0.25  Quantitative Gated Spect Images QGS EDV:  78 ml QGS ESV:  18 ml  Impression Exercise Capacity:  Fair exercise capacity. BP Response:  Hypertensive blood pressure response. Clinical Symptoms:  No significant symptoms noted. ECG Impression:  No significant ST segment change suggestive of ischemia. Comparison with Prior Nuclear Study: No images to compare  Overall Impression:  Normal stress nuclear study.  No evidence of ischemia.     LV Ejection Fraction: 77%.  LV Wall Motion:  NL LV Function; NL Wall Motion.   Thayer Headings, Brooke Bonito., MD, [facility] 04/08/2014, 6:03 PM [address]  [place] Pager [phone]

## 2014-09-29 ENCOUNTER — Ambulatory Visit (INDEPENDENT_AMBULATORY_CARE_PROVIDER_SITE_OTHER): Payer: BC Managed Care – PPO | Admitting: Pulmonary Disease

## 2014-09-29 ENCOUNTER — Telehealth: Payer: Self-pay | Admitting: Pulmonary Disease

## 2014-09-29 ENCOUNTER — Encounter: Payer: Self-pay | Admitting: Pulmonary Disease

## 2014-09-29 VITALS — BP 138/84 | HR 90 | Temp 98.3°F | Ht 67.0 in | Wt 212.2 lb

## 2014-09-29 DIAGNOSIS — G4733 Obstructive sleep apnea (adult) (pediatric): Secondary | ICD-10-CM

## 2014-09-29 DIAGNOSIS — J4520 Mild intermittent asthma, uncomplicated: Secondary | ICD-10-CM

## 2014-09-29 DIAGNOSIS — J209 Acute bronchitis, unspecified: Secondary | ICD-10-CM

## 2014-09-29 DIAGNOSIS — J301 Allergic rhinitis due to pollen: Secondary | ICD-10-CM

## 2014-09-29 MED ORDER — METHYLPREDNISOLONE ACETATE 80 MG/ML IJ SUSP
80.0000 mg | Freq: Once | INTRAMUSCULAR | Status: AC
Start: 2014-09-29 — End: 2014-09-29
  Administered 2014-09-29: 80 mg via INTRAMUSCULAR

## 2014-09-29 MED ORDER — BECLOMETHASONE DIPROPIONATE 80 MCG/ACT NA AERS: 2.0000 | INHALATION_SPRAY | Freq: Every day | NASAL | Status: DC

## 2014-09-29 MED ORDER — AZELASTINE HCL 0.1 % NA SOLN: 1.0000 | Freq: Two times a day (BID) | NASAL | Status: DC

## 2014-09-29 MED ORDER — METHYLPREDNISOLONE (PAK) 4 MG PO TABS: ORAL_TABLET | ORAL | Status: DC

## 2014-09-29 MED ORDER — LEVOFLOXACIN 500 MG PO TABS: 500.0000 mg | ORAL_TABLET | Freq: Every day | ORAL | Status: DC

## 2014-09-29 MED ORDER — BUDESONIDE-FORMOTEROL FUMARATE 160-4.5 MCG/ACT IN AERO: 2.0000 | INHALATION_SPRAY | Freq: Two times a day (BID) | RESPIRATORY_TRACT | Status: DC

## 2014-09-29 NOTE — Patient Instructions (Signed)
Today we updated your med list in our EPIC system...    Continue your current medications the same...  We wrote new prescriptions for [facility] 500mg  take one tab daily til gone...    Prednisone dosepak> start in AM 11/17 & take as directed on the pack...  Continue the Symbicort160- 2 sprays twice daily (then rinse) Continue the Mucinex 1200mg  twice daily w/ fluids... Use the DELSYM as needed for cough  Remember to take a probiotic like ALIGN daily while on the antibiotic...  Call for any questions or if I can be of service in any way.Marland KitchenMarland Kitchen

## 2014-09-29 NOTE — Progress Notes (Signed)
Subjective:    Patient ID: Carrie Perkins, female    DOB: 10/09/1951, 63 y.o.   MRN: [id number]  HPI 63 y/o WF here for a follow up visit... she is the daughter of Carrie Perkins, a former pt of ours, and we see her for general med purposes...  ~  December 12, 2011:  44mo ROV & [name] is doing quite well- on diet & exercising w/ a trainer, wt down 5# so far & she is encouraged; requests 2012 Flu vaccine & Rx for shingles shot;  See prob list below>>     She remains on Symbicort 160bid along w/ Zyrtek/ Astelin/ Mucinex- and doing well w/o much cough, sputum, SOB, etc...    Also due for FASTING blood work to recheck FLP, DM labs, etc> (see below)    Taking Calcium, MVI, Vit D from DrTomblin...   ~  February 03, 2012:  27mo ROV & she is c/o persistent/ recurrent URI symptoms w/ head & chest congestion> states it started w/ sore throat, sinus drainage to chest, cough w/ yellow sput, given SeptraDS which helped, then symptoms returned & given Levaquin, improved again then recurred once more; now taking Mucinex, phlegm is thick beige, continued sinus draining, ears stopped up, chest congested, dry cough, min SOB, no f/c/s etc...  We discussed Rx w/ Depo80 + Prednisone 20mg  4d tapering sched and specific treatment for Sinuses (Zyrtek Qam, Saline via Netti & mist Q1H, QNasl at bedtime) & Bronchial tubes (Symbicort160-2spBid, Mucinex-2Bid + Fluids...  CXR today shows heart size upper lim of norm, clear lungs, mild DJD in spine...  If not resolved we will need to consider ENT eval...  ~  June 21, 2012:  26mo ROV & Carrie Perkins reports that she is seeing Occupational hygienist for plantar fasciitis & taking Benedryl for mosquito bites on her legs;  She has been eating better she tells me & exercising w/ a personal trainer & now going to the "fit station" for healthy meals... Weight is 207# by our scale (down 3#).Marland KitchenMarland Kitchen We reviewed prob list, meds, xrays and labs> see below for updates >>   ~  January 09, 2013:  5mo ROV & Carrie Perkins  has had a good interval, no new complaints or concerns; she is trying to diet & working w/ a Physiological scientist; due for fasting blood work today; We reviewed the following medical problems during today's office visit >>     OSA> she had sleep study 2008 w/ AHI=22 & desat tp 80%; she declined CPAP etc & decided to try wt loss but has in fact gained wt; she now denies sleep disordered breathing, daytime hypersomnolence, etc...    AR, Asthma> on Zyrtek, Astelin, Flonase, Symbicort160; she denies resp exac, no recent infections, and no cough/ sput/ hemoptysis, SOB, etc...    Chol> on diet & exercise; FLP 2/14 shows TChol 208, TG 179, HDL 43, LDL 145; not at goals & wt is up- rec to start Simva40...    Hx Impaired Fasting Gluc> on diet alone; Labs 2/14 showed BS=119, A1c=5.9; Advised on diet, exercise, wt reduction strategies...    Overwt> wt=210# & she has been unable to lose wt; states she is working w/ a Clinical research associate now (hard to exerc due to right knee) & is encouraged...    GI- GERD> on Prilosec20 OTC as needed; states symptoms diminished w/ chewing gum; last colon was 2006 & neg- f/u due 2016...    Urin incont> eval & rx by GYN DrTomblin; we do not have notes to  review; she reports prev better on Toviaz...    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  We reviewed prob list, meds, xrays and labs> see below for updates >>   LABS 2/14:  FLP- not at goals on diet alone;  Chems- ok x BS=119, A1c=5.9;  CBC- wnl;  TSH=1.34;  VitD=39;  UA- clear...  ~  January 08, 2014:  Yearly ROV & CPX> Carrie Perkins reports a good yr- she had knee surg 7/14, recent trip to [place] & wt is up 9# to 218# (BMI=34), no new complaints or concerns... We reviewed the following medical problems during today's office visit >>     OSA> she had sleep study 2008 w/ AHI=22 & desat tp 80%; she declined CPAP etc &  decided to try wt loss but has in fact gained wt; she now denies sleep disordered breathing, daytime hypersomnolence, etc...    AR, Asthma> on Zyrtek, Astelin, Flonase, Symbicort160 (only using prn); she denies resp exac, no recent infections, and no cough/ sput/ hemoptysis, SOB, etc...    Chol> on diet & exercise, she never filled the Simva40, tried Niacin OTC & RYR; FLP 2/15 shows TChol 229, TG 234, HDL 55 LDL 148; not at goals & AGAIN rec to start Simva40...    Hx Impaired Fasting Gluc> on diet alone; Labs 2/15 showed BS=114, A1c=6.0; Advised on diet, exercise, wt reduction strategies...    Overwt> wt=218# & she has been unable to lose wt; states she is working w/ a Clinical research associate now (hard to exerc due to right knee) & is encouraged...    GI- GERD> on Prilosec20 OTC as needed; states symptoms diminished w/ chewing gum; last colon was 2006 & neg- f/u due 2016...    Gyn, Urin incont> eval & rx by GYN DrTomblin; we do not have notes to review; she reports prev better on Toviaz, may get another Gyn opinion...    DJD> on Mobic7.5; she had left knee surg at ~2005; now w/ right knee pain & [facility] has suggested right knee surg but she is holding off => now states she had left knee surg 7/14 (we don't have any records. We reviewed prob list, meds, xrays and labs> see below for updates >> she has refused Pneumonia vaccine and Flu shots...  CXR 2/15 showed norm heart size, clear lungs, NAD.Marland KitchenMarland Kitchen  EKG 2/15 showed NSR, rate69, poor R progression V1-5, NSSTTWA...   LABS 2/15:  FLP- not at goals on diet, rec to start Simva40;  Chems- ok on diet alone w/ BS=114, A1c=6.0;  CBC- wnl;  TSH=1.16...  ~  September 29, 2014:  8-79mo ROV & add-on appt requested for bronchitic infection> presents w/ 10d hx sore throat, followed by cough, yellow sput, chest congestion, rattling/ wheezing, sl SOB; she denies f/c/s, chest pain, hemoptysis, other systemic symptoms other than fatigue;  She has Hx asthma & AB- on Symbicort but only  uses it prn (restarted 1wk ago), Albut HFA rescue inhaler, Mucinex 1200mg Bid w/ fluids & OTC cough syrup;  She denies any signif exac since last seen;  Exam shows sl pharyngeal erythema, no exudates, no adenopathy, few bibasilar rhonchi & end-exp wheezing w/o consolidation...  We decided to treat w/ Depo80, Pred dosepak, Levaquin500mg h x7d, continue Symbicort160-2spBid, Mucinex 1200mg Bid, + fluids, OTC Delsym, etc...            Problem List:  PHYSICAL EXAMINATION (ICD-V70.0) - her GYN= DrTomblin for PAP, Mammograms, & hasn't had BMD yet and she will discuss w/ him... on ASA 81mg /d, MVI & Vit D 1000 u daily (Vit D level 1/13 = 48).  ALLERGIC RHINITIS (ICD-477.9) - states that she had allergy eval by DrYoung but can't find this in old paper chart... she decided against allergy shots and uses Zyrtek, Mucinex, Nasal irrigations, & Astepro Prn... Plus MMW as needed for throat.  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 she was given option of weight loss, oral appliance, CPAP and she chose weight reduction- but weight has been betw 195 - 198 for yrs, now 207# but she denies any worsening sleep problems, daytime hypersom symptoms, snoring, etc... States she rests well, wakes refreshed, & husb does not c/o her snoring.  ASTHMA (ICD-493.90) - she has RADS and is treated w/ SYMBICORT 160- 2spBid, MUCINEX Bid, Hycodan, etc... she denies recent infectious exac...  ~  baseline CXR= clear, WNL.Marland Kitchen. ~  PFT 3/08 showed FVC= 2.72 (80%), FEV1= 2.17 (79%), %1sec= 80, mid-flows= 77% pred... ~  f/u CXR 6/11 is clear, WNL.Marland Kitchen. 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 ~  2/14: on Zyrtek, Astelin, Flonase, Symbicort160; she denies resp exac, no recent infections, and no cough/ sput/ hemoptysis, SOB, etc. ~  2/15: on Zyrtek, Astelin, Flonase, Symbicort160 (only using prn); she  denies resp exac, no recent infections, and no cough/ sput/ hemoptysis, SOB, etc ~  11/15: presents w/ bronchitic exac & AB> treated w/ Depo80, Pred dosepak, Levaquin x7d, + continue Symbicort160, Mucinex, Fluids, Delsym, etc...   HYPERCHOLESTEROLEMIA, BORDERLINE (ICD-272.4) - on Diet & exercise alone, she has declined med rx... ~  labs from DrTomblin's office 2006 showed TChol 194, TG 167, HDL 53, LDL 108... he rec "f/u w/ PCP" ~  FLP 6/10 on diet alone showed TChol 164, TG 141, HDL 43, LDL 93 ~  FLP 6/11 on diet alone showed TChol 228, TG 216, HDL 43, LDL 157... she does not want meds! ~  [place] 1/13 on diet alone showed TChol 207, TG 159, HDL 47, LDL 140... Sl improved, keep up the good work. ~  [place] 2/14 on diet alone showed TChol 208, TG 179, HDL 43, LDL 145; not at goals & wt is up- rec to start Simva40=> she never did. ~  FLP 2/15 on diet alone showed TChol 229, TG 234, HDL 55 LDL 148; not at goals & AGAIN rec to start Simva40=> she never did!  OTHER ABNORMAL GLUCOSE (ICD-790.29) - on Diet & exercise program... ~  labs are from DrToblin's office 2006 showed FBS= 110, A1c= 5.4, and rec for pt to "f/u w/ PCP" ~  labs 6/10 showed BS= 100, A1c= 5.8 ~  labs 6/11 showed BS= 119 & she is reminded to get wt down or start meds. ~  Labs 1/13 showed BS= 102, A1c= 6.0 ~  Labs 2/14 on diet alone showed BS= 119, A1c= 5.9 ~  Labs 2/15 on diet alone showed BS= 114, A1c= 6.0  OVERWEIGHT (ICD-278.02) - we discussed diet, exercise, & wt reduction program... ~  weight 6/10 = 195# ~  weight 6/11 = 204#... We reviewed diet prescription. ~  weight 2/12 = 214#... she will start a "veggie diet". ~  Weight 6/12 = 212#... rec wt watchers or similar group. ~  Weight 1/13 =  207# ~  Weight 8/13 = 207# ~  Weight 2/14 = 210# ~  Weight 2/15 = 218# ~  Weight 11/15 = 212#  GASTROESOPHAGEAL REFLUX DISEASE (ICD-530.81) - hx of GERD symptoms prev treated w/ Protonix... She now uses "chewing gum" she says & reminded to  use Prilosec OTC if needed...  COLONOSCOPY - she states that DrTomblin sent her to [facility] for routine colonoscopy ~2006 and pt reports that this procedure was neg.  URINARY INCONTINENCE (ICD-788.30) - she notes that DrTomblin knows all about this and they are working on it> considering sling surgery... but some of her symptoms suggest overactive bladder & we decided to try TOVIAZ 8mg  as trial (it helped, she says- off now & doing well).  DEGENERATIVE JOINT DISEASE (ICD-715.90) - she had TKR surg in the past & a fractured wrist requiring ORIF, but denies any current ortho problems... ~  Tried on [facility] 7.5mg  daily as needed... ~  2/14: on Mobic7.5 prn; she had left knee surg at ~2005; now w/ right knee pain & [facility] has suggested right knee surg but she is holding off (offered 2nd opinion in [place])...    Health Maintenance >> ~  GI:  she reports neg colonoscopy by [facility] 2006 ~  GYN:  she is followed by DrTomblin ~  Immunizations:  She requests written Rx for Shingles vaccine.   Past Surgical History  Procedure Laterality Date  . Total knee arthroplasty    . Tubal ligation    . Left wrist orif after fall  2008  . Medial partial knee replacement Right 05/2013    done at [facility]---Dr. Palin    Outpatient Encounter Prescriptions as of 09/29/2014  Medication Sig  . aspirin 81 MG EC tablet Take 81 mg by mouth daily.    Marland Kitchen azelastine (ASTELIN) 0.1 % nasal spray Place 1 spray into both nostrils 2 (two) times daily. Use in each nostril as directed  . Beclomethasone Dipropionate (QNASL) 80 MCG/ACT AERS Place 2 sprays into the nose at bedtime.  . budesonide-formoterol (SYMBICORT) 160-4.5 MCG/ACT inhaler Inhale 2 puffs into the lungs 2 (two) times daily.  . cetirizine (ZYRTEC) 10 MG chewable tablet Chew 10 mg by mouth daily.   . Cholecalciferol 1000 UNITS capsule Take 1,000 Units by mouth daily.    Marland Kitchen dextromethorphan-guaiFENesin (MUCINEX DM) 30-600 MG per 12 hr tablet Take 2 tablets by  mouth every 12 (twelve) hours.   . Multiple Vitamin (MULTIVITAMIN) capsule Take 1 capsule by mouth daily.    . niacin 500 MG tablet Take 500 mg by mouth at bedtime.  . Red Yeast Rice 600 MG CAPS Take 1 capsule by mouth daily.  . [DISCONTINUED] azelastine (ASTELIN) 137 MCG/SPRAY nasal spray Place 1 spray into the nose 2 (two) times daily. Use in each nostril as directed   . [DISCONTINUED] Beclomethasone Dipropionate (QNASL) 80 MCG/ACT AERS Place 2 sprays into the nose at bedtime.  . [DISCONTINUED] Beclomethasone Dipropionate (QNASL) 80 MCG/ACT AERS Place 2 sprays into the nose at bedtime.  . [DISCONTINUED] budesonide-formoterol (SYMBICORT) 160-4.5 MCG/ACT inhaler Inhale 2 puffs into the lungs 2 (two) times daily.    Marland Kitchen levofloxacin (LEVAQUIN) 500 MG tablet Take 1 tablet (500 mg total) by mouth daily.  . methylPREDNIsolone (MEDROL DOSPACK) 4 MG tablet follow package directions  . [EXPIRED] methylPREDNISolone acetate (DEPO-MEDROL) injection 80 mg     Allergies  Allergen Reactions  . Amoxicillin-Pot Clavulanate     REACTION: severe abd pain---intolerant    Review of Systems  See HPI - all other systems neg except as noted... The patient complains of decreased hearing and dyspnea on exertion.  The patient denies anorexia, fever, weight loss, weight gain, vision loss, hoarseness, chest pain, syncope, peripheral edema, prolonged cough, headaches, hemoptysis, abdominal pain, melena, hematochezia, severe indigestion/heartburn, hematuria, incontinence, muscle weakness, suspicious skin lesions, transient blindness, difficulty walking, depression, unusual weight change, abnormal bleeding, enlarged lymph nodes, and angioedema.     Objective:   Physical Exam    WD, sl overweight, 63 y/o WF in NAD...  GENERAL:  Alert & oriented; pleasant & cooperative... HEENT:  [place]/AT, EOM-wnl, PERRLA, Fundi-benign, EACs- sl red, TMs-wnl, NOSE- sl red/ inflammed, THROAT- injected, otherw wnl. NECK:  Supple w/  full ROM; no JVD; normal carotid impulses w/o bruits; no thyromegaly or nodules palpated; no lymphadenopathy. CHEST:  Clear to P & A; without wheezes/ rales/ or rhonchi. HEART:  Regular Rhythm; without murmurs/ rubs/ or gallops. ABDOMEN:  Soft & nontender; normal bowel sounds; no organomegaly or masses detected. EXT: without deformities, mild arthritic changes; no varicose veins/ venous insuffic/ or edema. NEURO:  CN's intact; motor testing normal; sensory testing normal; gait normal & balance OK. DERM:  No lesions noted; no rash etc...  RADIOLOGY DATA:  Reviewed in the EPIC EMR & discussed w/ the patient...     LABORATORY DATA:  Reviewed in the EPIC EMR & discussed w/ the patient...   Assessment & Plan:    AR & ASTHMA w/ hx refractory AB exac>  She presented 11/15 w/ acute bronchitis & bronchitic exac=> treated w/ Depo80, Dosepak, Levaquin, + OTC meds...  OSA>  She denies sleep disordered breathing, daytime hypersomnolence, etc; She is advised to get on diet + exercise program & get wt down to avoid future problems...  CHOL>  She has intermittently shown excellent control on diet Rx, then again off diet numbers looked pretty bad; FLP now w/ LDL=148 & suggested to start simva40.  Abn BS>  BS & A1c look good on diet> everything depends on better diet, exercise, get wt down...  GI>  On OTC Prilosec as needed & f/u colon due 2016...  GYN/ Urine incont>  Followed by DrTomblin; Toviaz helped & she is off this med now, doing satis...  DJD>  Aware, uses OTC meds as needed...   Patient's Medications  New Prescriptions   LEVOFLOXACIN (LEVAQUIN) 500 MG TABLET    Take 1 tablet (500 mg total) by mouth daily.   METHYLPREDNISOLONE (MEDROL DOSPACK) 4 MG TABLET    follow package directions  Previous Medications   ASPIRIN 81 MG EC TABLET    Take 81 mg by mouth daily.     CETIRIZINE (ZYRTEC) 10 MG CHEWABLE TABLET    Chew 10 mg by mouth daily.    CHOLECALCIFEROL 1000 UNITS CAPSULE    Take 1,000  Units by mouth daily.     DEXTROMETHORPHAN-GUAIFENESIN (MUCINEX DM) 30-600 MG PER 12 HR TABLET    Take 2 tablets by mouth every 12 (twelve) hours.    MULTIPLE VITAMIN (MULTIVITAMIN) CAPSULE    Take 1 capsule by mouth daily.     NIACIN 500 MG TABLET    Take 500 mg by mouth at bedtime.   RED YEAST RICE 600 MG CAPS    Take 1 capsule by mouth daily.  Modified Medications   Modified Medication Previous Medication   AZELASTINE (ASTELIN) 0.1 % NASAL SPRAY azelastine (ASTELIN) 137 MCG/SPRAY nasal spray      Place 1 spray into both nostrils 2 (two) times  daily. Use in each nostril as directed    Place 1 spray into the nose 2 (two) times daily. Use in each nostril as directed    BECLOMETHASONE DIPROPIONATE (QNASL) 80 MCG/ACT AERS Beclomethasone Dipropionate (QNASL) 80 MCG/ACT AERS      Place 2 sprays into the nose at bedtime.    Place 2 sprays into the nose at bedtime.   BUDESONIDE-FORMOTEROL (SYMBICORT) 160-4.5 MCG/ACT INHALER budesonide-formoterol (SYMBICORT) 160-4.5 MCG/ACT inhaler      Inhale 2 puffs into the lungs 2 (two) times daily.    Inhale 2 puffs into the lungs 2 (two) times daily.    Discontinued Medications   No medications on file

## 2014-09-29 NOTE — Telephone Encounter (Signed)
Called and spoke to pt. Pt c/o prod cough with yellow mucus and SOB. Pt states she has a URI and it is not improving. Pt requesting appt with SN soonest available. Appt made for today at 09/29/14 at 12p. Pt verbalized understanding and denied any further questions or concerns at this time.

## 2015-01-26 DIAGNOSIS — H2513 Age-related nuclear cataract, bilateral: Secondary | ICD-10-CM | POA: Diagnosis not present

## 2015-01-26 DIAGNOSIS — H43812 Vitreous degeneration, left eye: Secondary | ICD-10-CM | POA: Diagnosis not present

## 2015-04-20 DIAGNOSIS — Z471 Aftercare following joint replacement surgery: Secondary | ICD-10-CM | POA: Diagnosis not present

## 2015-04-20 DIAGNOSIS — M25569 Pain in unspecified knee: Secondary | ICD-10-CM | POA: Diagnosis not present

## 2015-04-20 DIAGNOSIS — Z96653 Presence of artificial knee joint, bilateral: Secondary | ICD-10-CM | POA: Diagnosis not present

## 2015-10-02 ENCOUNTER — Telehealth: Payer: Self-pay | Admitting: Pulmonary Disease

## 2015-10-02 MED ORDER — AZITHROMYCIN 250 MG PO TABS: ORAL_TABLET | ORAL | Status: DC

## 2015-10-02 MED ORDER — METHYLPREDNISOLONE 4 MG PO TBPK: ORAL_TABLET | ORAL | Status: DC

## 2015-10-02 NOTE — Telephone Encounter (Signed)
Patient has upper respiratory infection symptoms x 1 week, the infection has gotten into her chest.  Yellow mucus.  Patient is flying to [place] today at [employer] and needs an antibiotic called in.  No sob.  Has cough and hoarseness, but no sore throat.    [place].  Allergies  Allergen Reactions  . Amoxicillin-Pot Clavulanate     REACTION: severe abd pain---intolerant   Current Outpatient Prescriptions on File Prior to Visit  Medication Sig Dispense Refill  . aspirin 81 MG EC tablet Take 81 mg by mouth daily.      Marland Kitchen azelastine (ASTELIN) 0.1 % nasal spray Place 1 spray into both nostrils 2 (two) times daily. Use in each nostril as directed 90 mL 3  . Beclomethasone Dipropionate (QNASL) 80 MCG/ACT AERS Place 2 sprays into the nose at bedtime. 3 Inhaler 3  . budesonide-formoterol (SYMBICORT) 160-4.5 MCG/ACT inhaler Inhale 2 puffs into the lungs 2 (two) times daily. 3 Inhaler 3  . cetirizine (ZYRTEC) 10 MG chewable tablet Chew 10 mg by mouth daily.     . Cholecalciferol 1000 UNITS capsule Take 1,000 Units by mouth daily.      Marland Kitchen dextromethorphan-guaiFENesin (MUCINEX DM) 30-600 MG per 12 hr tablet Take 2 tablets by mouth every 12 (twelve) hours.     Marland Kitchen levofloxacin (LEVAQUIN) 500 MG tablet Take 1 tablet (500 mg total) by mouth daily. 7 tablet 0  . methylPREDNIsolone (MEDROL DOSPACK) 4 MG tablet follow package directions 21 tablet 0  . Multiple Vitamin (MULTIVITAMIN) capsule Take 1 capsule by mouth daily.      . niacin 500 MG tablet Take 500 mg by mouth at bedtime.    . Red Yeast Rice 600 MG CAPS Take 1 capsule by mouth daily.     No current facility-administered medications on file prior to visit.

## 2015-10-02 NOTE — Telephone Encounter (Signed)
Per SN>> Call in z-pak ans medrol dose pak for pt. Advise pt to also take mucinex 1-2 tablets BID and OTC Delysm  Called pt and notified of SN rec Pt voiced understanding  RX called in to pharmacy  Nothing further is needed

## 2015-11-17 ENCOUNTER — Ambulatory Visit (INDEPENDENT_AMBULATORY_CARE_PROVIDER_SITE_OTHER): Payer: Medicare Other | Admitting: Adult Health

## 2015-11-17 ENCOUNTER — Encounter: Payer: Self-pay | Admitting: Adult Health

## 2015-11-17 VITALS — BP 122/62 | HR 84 | Temp 98.7°F | Ht 67.0 in | Wt 213.8 lb

## 2015-11-17 DIAGNOSIS — J209 Acute bronchitis, unspecified: Secondary | ICD-10-CM | POA: Diagnosis not present

## 2015-11-17 MED ORDER — CEFDINIR 300 MG PO CAPS: 300.0000 mg | ORAL_CAPSULE | Freq: Two times a day (BID) | ORAL | Status: DC

## 2015-11-17 NOTE — Assessment & Plan Note (Signed)
Flare with AR   Plan  Omnicef 300mg  Twice daily  For 1 week.  Mucinex DM Twice daily  As needed  Cough/congestion  Saline nasal rinses As needed   Fluids and rest  Please contact office for sooner follow up if symptoms do not improve or worsen or seek emergency care  Follow up Dr. Lenna Gilford  In 4 weeks and As needed

## 2015-11-17 NOTE — Patient Instructions (Signed)
Omnicef 300mg  Twice daily  For 1 week.  Mucinex DM Twice daily  As needed  Cough/congestion  Saline nasal rinses As needed   Fluids and rest  Please contact office for sooner follow up if symptoms do not improve or worsen or seek emergency care  Follow up Dr. Lenna Gilford  In 4 weeks and As needed

## 2015-11-17 NOTE — Progress Notes (Signed)
Subjective:    Patient ID: Carrie Perkins, female    DOB: 07/31/1951, 65 y.o.   MRN: [id number]  HPI 65 yo female with AR , asthma , OSA , dyslipidemia , GERD   11/17/2015 Acute OV  Pt presents for an acute office visit.  Pt complains of 1 week of nasal cong, chest cong, prod cough (yellow phlem), PND .   No wheezing, no chest tightness , no f/c/s/n/v. No hemoptysis or chest pain.  Using essenital oil , places 1 drop in water and drinks it.  Advised these are for topical use only  Needs refills of symbicort.     Past Medical History  Diagnosis Date  . Allergic rhinitis, cause unspecified   . Obstructive sleep apnea (adult) (pediatric)   . Other dyspnea and respiratory abnormality   . Unspecified asthma(493.90)   . Other and unspecified hyperlipidemia   . Overweight(278.02)   . Esophageal reflux   . Unspecified urinary incontinence   . Osteoarthrosis, unspecified whether generalized or localized, unspecified site    Current Outpatient Prescriptions on File Prior to Visit  Medication Sig Dispense Refill  . aspirin 81 MG EC tablet Take 81 mg by mouth daily.      Marland Kitchen azelastine (ASTELIN) 0.1 % nasal spray Place 1 spray into both nostrils 2 (two) times daily. Use in each nostril as directed 90 mL 3  . Beclomethasone Dipropionate (QNASL) 80 MCG/ACT AERS Place 2 sprays into the nose at bedtime. 3 Inhaler 3  . budesonide-formoterol (SYMBICORT) 160-4.5 MCG/ACT inhaler Inhale 2 puffs into the lungs 2 (two) times daily. 3 Inhaler 3  . cetirizine (ZYRTEC) 10 MG chewable tablet Chew 10 mg by mouth daily.     . Cholecalciferol 1000 UNITS capsule Take 1,000 Units by mouth daily.      Marland Kitchen dextromethorphan-guaiFENesin (MUCINEX DM) 30-600 MG per 12 hr tablet Take 2 tablets by mouth every 12 (twelve) hours.     . Multiple Vitamin (MULTIVITAMIN) capsule Take 1 capsule by mouth daily.      . niacin 500 MG tablet Take 500 mg by mouth at bedtime.    . Red Yeast Rice 600 MG CAPS Take 1 capsule by mouth  daily.     No current facility-administered medications on file prior to visit.     Review of Systems Constitutional:   No  weight loss, night sweats,  Fevers, chills, fatigue, or  lassitude.  HEENT:   No headaches,  Difficulty swallowing,  Tooth/dental problems, or  Sore throat,                No sneezing, itching, ear ache,  +nasal congestion, post nasal drip,   CV:  No chest pain,  Orthopnea, PND, swelling in lower extremities, anasarca, dizziness, palpitations, syncope.   GI  No heartburn, indigestion, abdominal pain, nausea, vomiting, diarrhea, change in bowel habits, loss of appetite, bloody stools.   Resp:   No chest wall deformity  Skin: no rash or lesions.  GU: no dysuria, change in color of urine, no urgency or frequency.  No flank pain, no hematuria   MS:  No joint pain or swelling.  No decreased range of motion.  No back pain.  Psych:  No change in mood or affect. No depression or anxiety.  No memory loss.         Objective:   Physical Exam   Filed Vitals:   11/17/15 0947  BP: 122/62  Pulse: 84  Temp: 98.7 F (37.1 C)  TempSrc:  Oral  Height: 5\' 7"  (1.702 m)  Weight: 213 lb 12.8 oz (96.979 kg)  SpO2: 97%    GEN: A/Ox3; pleasant , NAD, obese   HEENT:  [place]/AT,  EACs-clear, TMs-wnl, NOSE-clear drainage , THROAT-clear, no lesions, no postnasal drip or exudate noted.   NECK:  Supple w/ fair ROM; no JVD; normal carotid impulses w/o bruits; no thyromegaly or nodules palpated; no lymphadenopathy.  RESP  Clear  P & A; w/o, wheezes/ rales/ or rhonchi.no accessory muscle use, no dullness to percussion  CARD:  RRR, no m/r/g  , no peripheral edema, pulses intact, no cyanosis or clubbing.  GI:   Soft & nt; nml bowel sounds; no organomegaly or masses detected.  Musco: Warm bil, no deformities or joint swelling noted.   Neuro: alert, no focal deficits noted.    Skin: Warm, no lesions or rashes         Assessment & Plan:

## 2015-12-16 ENCOUNTER — Ambulatory Visit: Payer: Self-pay | Admitting: Pulmonary Disease

## 2015-12-17 ENCOUNTER — Ambulatory Visit: Payer: Self-pay | Admitting: Pulmonary Disease

## 2016-01-20 ENCOUNTER — Ambulatory Visit (INDEPENDENT_AMBULATORY_CARE_PROVIDER_SITE_OTHER): Payer: Medicare Other | Admitting: Pulmonary Disease

## 2016-01-20 ENCOUNTER — Other Ambulatory Visit (INDEPENDENT_AMBULATORY_CARE_PROVIDER_SITE_OTHER): Payer: Medicare Other

## 2016-01-20 ENCOUNTER — Ambulatory Visit (INDEPENDENT_AMBULATORY_CARE_PROVIDER_SITE_OTHER)
Admission: RE | Admit: 2016-01-20 | Discharge: 2016-01-20 | Disposition: A | Discharge status: Home / Self Care | Payer: Medicare Other | Source: Ambulatory Visit | Attending: Pulmonary Disease | Admitting: Pulmonary Disease

## 2016-01-20 ENCOUNTER — Telehealth: Payer: Self-pay | Admitting: Pulmonary Disease

## 2016-01-20 ENCOUNTER — Encounter: Payer: Self-pay | Admitting: Pulmonary Disease

## 2016-01-20 VITALS — BP 132/64 | HR 66 | Temp 97.0°F | Ht 67.0 in | Wt 215.8 lb

## 2016-01-20 DIAGNOSIS — M1990 Unspecified osteoarthritis, unspecified site: Secondary | ICD-10-CM

## 2016-01-20 DIAGNOSIS — E785 Hyperlipidemia, unspecified: Secondary | ICD-10-CM

## 2016-01-20 DIAGNOSIS — K219 Gastro-esophageal reflux disease without esophagitis: Secondary | ICD-10-CM | POA: Diagnosis not present

## 2016-01-20 DIAGNOSIS — F411 Generalized anxiety disorder: Secondary | ICD-10-CM

## 2016-01-20 DIAGNOSIS — G4733 Obstructive sleep apnea (adult) (pediatric): Secondary | ICD-10-CM

## 2016-01-20 DIAGNOSIS — J4520 Mild intermittent asthma, uncomplicated: Secondary | ICD-10-CM | POA: Diagnosis not present

## 2016-01-20 DIAGNOSIS — I5032 Chronic diastolic (congestive) heart failure: Secondary | ICD-10-CM | POA: Diagnosis not present

## 2016-01-20 DIAGNOSIS — Z0000 Encounter for general adult medical examination without abnormal findings: Secondary | ICD-10-CM | POA: Diagnosis not present

## 2016-01-20 DIAGNOSIS — R7301 Impaired fasting glucose: Secondary | ICD-10-CM

## 2016-01-20 DIAGNOSIS — Z1231 Encounter for screening mammogram for malignant neoplasm of breast: Secondary | ICD-10-CM

## 2016-01-20 DIAGNOSIS — R9431 Abnormal electrocardiogram [ECG] [EKG]: Secondary | ICD-10-CM

## 2016-01-20 DIAGNOSIS — J309 Allergic rhinitis, unspecified: Secondary | ICD-10-CM

## 2016-01-20 DIAGNOSIS — M8580 Other specified disorders of bone density and structure, unspecified site: Secondary | ICD-10-CM | POA: Diagnosis not present

## 2016-01-20 DIAGNOSIS — R7989 Other specified abnormal findings of blood chemistry: Secondary | ICD-10-CM

## 2016-01-20 DIAGNOSIS — E663 Overweight: Secondary | ICD-10-CM

## 2016-01-20 DIAGNOSIS — I5189 Other ill-defined heart diseases: Secondary | ICD-10-CM | POA: Insufficient documentation

## 2016-01-20 DIAGNOSIS — I519 Heart disease, unspecified: Secondary | ICD-10-CM

## 2016-01-20 DIAGNOSIS — I5030 Unspecified diastolic (congestive) heart failure: Secondary | ICD-10-CM | POA: Insufficient documentation

## 2016-01-20 LAB — TSH: TSH: 0.93 u[IU]/mL (ref 0.35–4.50)

## 2016-01-20 LAB — LIPID PANEL
Cholesterol: 205 mg/dL — ABNORMAL HIGH (ref 0–200)
HDL: 44.6 mg/dL
NonHDL: 160.87
Total CHOL/HDL Ratio: 5
Triglycerides: 234 mg/dL — ABNORMAL HIGH (ref 0.0–149.0)
VLDL: 46.8 mg/dL — ABNORMAL HIGH (ref 0.0–40.0)

## 2016-01-20 LAB — HEPATIC FUNCTION PANEL
ALT: 23 U/L (ref 0–35)
AST: 18 U/L (ref 0–37)
Albumin: 4.4 g/dL (ref 3.5–5.2)
Alkaline Phosphatase: 61 U/L (ref 39–117)
Bilirubin, Direct: 0.1 mg/dL (ref 0.0–0.3)
Total Bilirubin: 0.6 mg/dL (ref 0.2–1.2)
Total Protein: 6.9 g/dL (ref 6.0–8.3)

## 2016-01-20 LAB — LDL CHOLESTEROL, DIRECT: Direct LDL: 126 mg/dL

## 2016-01-20 LAB — BASIC METABOLIC PANEL
BUN: 12 mg/dL (ref 6–23)
CHLORIDE: 102 meq/L (ref 96–112)
CO2: 28 meq/L (ref 19–32)
CREATININE: 0.62 mg/dL (ref 0.40–1.20)
Calcium: 9.5 mg/dL (ref 8.4–10.5)
GFR: 102.22 mL/min (ref >60.00)
GLUCOSE: 131 mg/dL — AB (ref 70–99)
Potassium: 4 mEq/L (ref 3.5–5.1)
Sodium: 139 mEq/L (ref 135–145)

## 2016-01-20 LAB — CBC WITH DIFFERENTIAL/PLATELET
Basophils Absolute: 0.1 10*3/uL (ref 0.0–0.1)
Basophils Relative: 0.9 % (ref 0.0–3.0)
Eosinophils Absolute: 0.2 10*3/uL (ref 0.0–0.7)
Eosinophils Relative: 3.3 % (ref 0.0–5.0)
HCT: 42.2 % (ref 36.0–46.0)
Hemoglobin: 14.8 g/dL (ref 12.0–15.0)
Lymphocytes Relative: 39.2 % (ref 12.0–46.0)
Lymphs Abs: 2.6 10*3/uL (ref 0.7–4.0)
MCHC: 35.2 g/dL (ref 30.0–36.0)
MCV: 84.4 fl (ref 78.0–100.0)
Monocytes Absolute: 0.5 10*3/uL (ref 0.1–1.0)
Monocytes Relative: 7.8 % (ref 3.0–12.0)
Neutro Abs: 3.2 10*3/uL (ref 1.4–7.7)
Neutrophils Relative %: 48.8 % (ref 43.0–77.0)
Platelets: 284 10*3/uL (ref 150.0–400.0)
RBC: 4.99 Mil/uL (ref 3.87–5.11)
RDW: 13.7 % (ref 11.5–15.5)
WBC: 6.5 10*3/uL (ref 4.0–10.5)

## 2016-01-20 LAB — VITAMIN D 25 HYDROXY (VIT D DEFICIENCY, FRACTURES): VITD: 14.79 ng/mL — ABNORMAL LOW (ref 30.00–100.00)

## 2016-01-20 LAB — HEMOGLOBIN A1C: Hgb A1c MFr Bld: 6.4 % (ref 4.6–6.5)

## 2016-01-20 MED ORDER — BUDESONIDE-FORMOTEROL FUMARATE 160-4.5 MCG/ACT IN AERO: 2.0000 | INHALATION_SPRAY | Freq: Two times a day (BID) | RESPIRATORY_TRACT | Status: DC

## 2016-01-20 MED ORDER — AZELASTINE HCL 0.1 % NA SOLN: 1.0000 | Freq: Two times a day (BID) | NASAL | Status: DC

## 2016-01-20 MED ORDER — BECLOMETHASONE DIPROPIONATE 80 MCG/ACT NA AERS: 2.0000 | INHALATION_SPRAY | Freq: Every day | NASAL | Status: DC

## 2016-01-20 NOTE — Patient Instructions (Signed)
Today we updated your med list in our EPIC system...    Continue your current medications the same...  Today we checked a follow up CXR, EKG, & FASTING blood work... We will arrange for an outpt mammogram and Bone density test...    We will contact you w/ the results when available...   Let's get on track w/ our diet 7 exercise program...    The goal is to lose that 1st 20 lbs!  Call for any questions...  Let's plan a follow up visit in 8yr, sooner if needed for problems.Marland KitchenMarland Kitchen

## 2016-01-20 NOTE — Progress Notes (Signed)
Subjective:    Patient ID: Carrie Perkins, female    DOB: 1951-05-03, 65 y.o.   MRN: [id number]  HPI 65 y/o WF here for a follow up visit... she is the daughter of [name], a former pt of ours, and we see her for general med purposes... ~  SEE PREV EPIC NOTES FOR OLDER DATA >>    CXR 02/03/12 shows heart size upper lim of norm, clear lungs, mild DJD in spine...   ~  January 09, 2013:  79mo ROV & Carrie Perkins has had a good interval, no new complaints or concerns; she is trying to diet & working w/ a Physiological scientist; due for fasting blood work today; We reviewed the following medical problems during today's office visit >>     OSA> she had sleep study 2008 w/ AHI=22 & desat tp 80%; she declined CPAP etc & decided to try wt loss but has in fact gained wt; she now denies sleep disordered breathing, daytime hypersomnolence, etc...    AR, Asthma> on Zyrtek, Astelin, Flonase, Symbicort160; she denies resp exac, no recent infections, and no cough/ sput/ hemoptysis, SOB, etc...    Chol> on diet & exercise; FLP 2/14 shows TChol 208, TG 179, HDL 43, LDL 145; not at goals & wt is up- rec to start Simva40...    Hx Impaired Fasting Gluc> on diet alone; Labs 2/14 showed BS=119, A1c=5.9; Advised on diet, exercise, wt reduction strategies...    Overwt> wt=210# & she has been unable to lose wt; states she is working w/ a Clinical research associate now (hard to exerc due to right knee) & is encouraged...    GI- GERD> on Prilosec20 OTC as needed; states symptoms diminished w/ chewing gum; last colon was 2006 & neg- f/u due 2016...    Urin incont> eval & rx by GYN DrTomblin; we do not have notes to review; she reports prev better on Toviaz...    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  We reviewed prob list, meds, xrays and labs> see below for updates >>   LABS 2/14:  FLP- not at goals on diet alone;   Chems- ok x BS=119, A1c=5.9;  CBC- wnl;  TSH=1.34;  VitD=39;  UA- clear...  ~  January 08, 2014:  Yearly ROV & CPX> Carrie Perkins reports a good yr- she had knee surg 7/14, recent trip to [place] & wt is up 9# to 218# (BMI=34), no new complaints or concerns... we reviewed the following medical problems during today's office visit >>     OSA> she had sleep study 2008 w/ AHI=22 & desat tp 80%; she declined CPAP etc & decided to try wt loss but has in fact gained wt; she now denies sleep disordered breathing, daytime hypersomnolence, etc...    AR, Asthma> on Zyrtek, Astelin, Flonase, Symbicort160 (only using prn); she denies resp exac, no recent infections, and no cough/ sput/ hemoptysis, SOB, etc...    Chol> on diet & exercise, she never filled the Simva40, tried Niacin OTC & RYR; FLP 2/15 shows TChol 229, TG 234, HDL 55 LDL 148; not at goals & AGAIN rec to start Simva40...    Hx Impaired Fasting Gluc> on diet alone; Labs 2/15 showed BS=114, A1c=6.0; Advised on diet, exercise, wt reduction strategies...    Overwt> wt=218# & she has been unable to lose wt; states she is working w/  a trainer now (hard to exerc due to right knee) & is encouraged...    GI- GERD> on Prilosec20 OTC as needed; states symptoms diminished w/ chewing gum; last colon was 2006 & neg- f/u due 2016...    Gyn, Urin incont> eval & rx by GYN DrTomblin; we do not have notes to review; she reports prev better on Toviaz, may get another Gyn opinion...    DJD> on Mobic7.5; she had left knee surg at ~2005; now w/ right knee pain & [facility] has suggested right knee surg but she is holding off => now states she had left knee surg 7/14 (we don't have any records. We reviewed prob list, meds, xrays and labs> see below for updates >> she has refused Pneumonia vaccine and Flu shots...  CXR 2/15 showed norm heart size, clear lungs, NAD.Marland KitchenMarland Kitchen  EKG 2/15 showed NSR, rate69, poor R progression V1-5, NSSTTWA...   LABS 2/15:  FLP- not at goals on diet, rec to  start Simva40;  Chems- ok on diet alone w/ BS=114, A1c=6.0;  CBC- wnl;  TSH=1.16...  ~  [id number]:  She had cardiac eval by DrTTurner:    EKG 01/08/14>  NSR, rate69, poor R progression V1-5, NSSTTWA...   2DEcho 02/04/14>  Mod LVH but norm LVF w/ EF=60-65%, norm wall motion, Gr2DD, norm valves...  Myoview 04/08/14>  Fair exercise capacity, hypertensive BP response, no symptoms and no ST segment changes w/ exercise, no ischemia/ normal wall motion/ EF=77%...  ~  September 29, 2014:  8-109mo ROV & add-on appt requested for bronchitic infection> presents w/ 10d hx sore throat, followed by cough, yellow sput, chest congestion, rattling/ wheezing, sl SOB; she denies f/c/s, chest pain, hemoptysis, other systemic symptoms other than fatigue;  She has Hx asthma & AB- on Symbicort but only uses it prn (restarted 1wk ago), Albut HFA rescue inhaler, Mucinex 1200mg Bid w/ fluids & OTC cough syrup;  She denies any signif exac since last seen;  Exam shows sl pharyngeal erythema, no exudates, no adenopathy, few bibasilar rhonchi & end-exp wheezing w/o consolidation...  We decided to treat w/ Depo80, Pred dosepak, Levaquin500mg h x7d, continue Symbicort160-2spBid, Mucinex 1200mg Bid, + fluids, OTC Delsym, etc...   ~  January 20, 2016:  67mo ROV & medical follow up visit>   Carrie Perkins returns for medical follow up visit & notes a good yr, doing satis, no new complaints or concerns;  In the interim she has had one f/u visit- 11/2015 w/ TP for acute bronchitic exac c/o cough/ congestion/ yellow sput, and treated w/ Omnicef, mucinex, nasal saline, & fluids... we reviewed the following medical problems during today's office visit >>     OSA> she had sleep study 2008 w/ AHI=22 & desat tp 80%; she declined CPAP etc & decided to try wt loss but has in fact gained wt; she now denies sleep disordered breathing, daytime hypersomnolence, & declines further eval.    AR, Asthma> on Zyrtek, Astelin, Nasocort, Symbicort160 (only using prn); she denies  resp exac, no recent infections, and no cough/ sput/ hemoptysis, SOB, etc...    Chol> on diet & exercise, she never filled the Simva40, tried Niacin OTC & RYR; FLP 3/17 shows TChol 205, TG 234, HDL 45 LDL 126; not at goals & she declines meds, we discussed diet...    Hx Impaired Fasting Gluc> on diet alone; Labs 3/17 showed BS=131, A1c=6.4; Advised on diet, exercise, wt reduction strategies; tol to start meds if she can't lose the wt......    Overwt> wt=216# &  she has been unable to lose wt; states she is working w/ a Clinical research associate (hard to exerc due to right knee) & is encouraged...    GI- GERD> on Prilosec20 OTC as needed; states symptoms diminished w/ chewing gum; last colon was 2006 & neg- she is overdue to f/u colon & we will refer to GI...    Gyn, Urin incont> eval & rx by GYN DrTomblin; we do not have notes to review; she reports prev better on Toviaz, may get another Gyn opinion...    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    Osteopenia, Vit D defic> BMD w/ lowest Tscore= -1.5 in R FemNeck; labs 01/20/16 showed VitD level = 15 & pt rec to take 3-4000u daily to build this up; Rec Women's MVI, Calcium, VitD, & exercise...  EXAM shows Afeb, VSS, O2sat=97% on RA;  HEENT- neg, mallampati3 w/ short neck;  Chest- clear w/o w/r/r;  Heart- RR w/o m/r/g;  Abd- obese, soft, nontender, neg;  Ext- no c/c/e;  Neuro- intact...  CXR 01/20/16>  Norm heart size, clear lungs w/ sl coarsening of the interstitial markings, NAD.Marland KitchenMarland Kitchen   EKG 01/20/16>  NSR, rate75, poor R progression V1-4, NSSTTWA...   LABS 01/20/16>  FLP- improved but not at goals on diet alone w/ TChol205,  TG234, LDL 126;  Chems- ok x BS=131, A1c=6.4;  CBC- wnl;  TSH=0.93;  VitD=15... IMP/PLAN>>  Carrie Perkins has a number of medical issues outlined above;  She needs diet/ exercise/ wt reduction; she needs to take her Symbicort more regularly; she has declined Chol meds and DM meds- prefers diet rx; she has declined OSA rx and needs to lose the weight; she is due for a colonoscopy & GYN check; she is followed at [facility] for Ortho; rec to incr VitD supplement to 3-4000u daily...  ADDENDUM>>  Mammogram (neg) and BMD done at [place] 09/07/16>  BMD shows lowest Tscore= -1.5 in R FemNeck, -1.1 in APspine; she is rec to take Calcium supplement/ womensMVI/ extra3-4000u VitD/ exercise program...            Problem List:  PHYSICAL EXAMINATION (ICD-V70.0) - her GYN= DrTomblin for PAP, Mammograms, & hasn't had BMD yet and she will discuss w/ him... on ASA 81mg /d, MVI & Vit D 1000 u daily (Vit D level 1/13 = 48).  ALLERGIC RHINITIS (ICD-477.9) - states that she had allergy eval by DrYoung but can't find this in old paper chart... she decided against allergy shots and uses Zyrtek, Mucinex, Nasal irrigations, & Astepro Prn... Plus MMW as needed for throat.  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 she was given option of weight loss, oral appliance, CPAP and she chose weight reduction- but weight has been betw 195 - 198 for yrs, now 207# but she denies any worsening sleep problems, daytime hypersom symptoms, snoring, etc... States she rests well, wakes refreshed, & husb does not c/o her snoring.  ASTHMA (ICD-493.90) - she has RADS and is treated w/ SYMBICORT 160-  2spBid, MUCINEX Bid, Hycodan, etc... she denies recent infectious exac...  ~  baseline CXR= clear, WNL.Marland Kitchen. ~  PFT 3/08 showed FVC= 2.72 (80%), FEV1= 2.17 (79%), %1sec= 80, mid-flows= 77% pred... ~  f/u CXR 6/11 is clear, WNL.Marland Kitchen. ~  CXR  3/13 showed heart at upper lim norm, clear lungs, mild DJD in spine... ~  2/14: on Zyrtek, Astelin, Flonase, Symbicort160; she denies resp exac, no recent infections, and no cough/ sput/ hemoptysis, SOB, etc. ~  2/15: on Zyrtek, Astelin, Flonase, Symbicort160 (only using prn); she denies resp exac, no recent infections, and no cough/ sput/ hemoptysis, SOB, etc ~  11/15: presents w/ bronchitic exac & AB> treated w/ Depo80, Pred dosepak, Levaquin x7d, + continue Symbicort160, Mucinex, Fluids, Delsym, etc...   HYPERCHOLESTEROLEMIA, BORDERLINE (ICD-272.4) - on Diet & exercise alone, she has declined med rx... ~  labs from DrTomblin's office 2006 showed TChol 194, TG 167, HDL 53, LDL 108... he rec "f/u w/ PCP" ~  FLP 6/10 on diet alone showed TChol 164, TG 141, HDL 43, LDL 93 ~  FLP 6/11 on diet alone showed TChol 228, TG 216, HDL 43, LDL 157... she does not want meds! ~  [place] 1/13 on diet alone showed TChol 207, TG 159, HDL 47, LDL 140... Sl improved, keep up the good work. ~  [place] 2/14 on diet alone showed TChol 208, TG 179, HDL 43, LDL 145; not at goals & wt is up- rec to start Simva40=> she never did. ~  FLP 2/15 on diet alone showed TChol 229, TG 234, HDL 55 LDL 148; not at goals & AGAIN rec to start Simva40=> she never did!  OTHER ABNORMAL GLUCOSE (ICD-790.29) - on Diet & exercise program... ~  labs are from DrToblin's office 2006 showed FBS= 110, A1c= 5.4, and rec for pt to "f/u w/ PCP" ~  labs 6/10 showed BS= 100, A1c= 5.8 ~  labs 6/11 showed BS= 119 & she is reminded to get wt down or start meds. ~  Labs 1/13 showed BS= 102, A1c= 6.0 ~  Labs 2/14 on diet alone showed BS= 119, A1c= 5.9 ~  Labs 2/15 on diet alone showed BS= 114, A1c= 6.0  OVERWEIGHT (ICD-278.02) - we discussed diet, exercise, & wt reduction program... ~  weight 6/10 = 195# ~  weight 6/11 = 204#... We reviewed diet prescription. ~  weight 2/12 = 214#... she will start a "veggie diet". ~  Weight 6/12 = 212#... rec wt watchers  or similar group. ~  Weight 1/13 = 207# ~  Weight 8/13 = 207# ~  Weight 2/14 = 210# ~  Weight 2/15 = 218# ~  Weight 11/15 = 212#  GASTROESOPHAGEAL REFLUX DISEASE (ICD-530.81) - hx of GERD symptoms prev treated w/ Protonix... She now uses "chewing gum" she says & reminded to use Prilosec OTC if needed...  COLONOSCOPY - she states that DrTomblin sent her to [facility] for routine colonoscopy ~2006 and pt reports that this procedure was neg.  URINARY INCONTINENCE (ICD-788.30) - she notes that DrTomblin knows all about this and they are working on it> considering sling surgery... but some of her symptoms suggest overactive bladder & we decided to try TOVIAZ 8mg  as trial (it helped, she says- off now & doing well).  DEGENERATIVE JOINT DISEASE (ICD-715.90) - she had TKR surg in the past & a fractured wrist requiring ORIF, but denies any current ortho problems... ~  Tried on [facility] 7.5mg  daily as needed... ~  2/14: on Mobic7.5 prn; she had  left knee surg at ~2005; now w/ right knee pain & [facility] has suggested right knee surg but she is holding off (offered 2nd opinion in [place])...    Health Maintenance >> ~  GI:  she reports neg colonoscopy by [facility] 2006 ~  GYN:  she is followed by DrTomblin ~  Immunizations:  She requests written Rx for Shingles vaccine.   Past Surgical History  Procedure Laterality Date  . Total knee arthroplasty    . Tubal ligation    . Left wrist orif after fall  2008  . Medial partial knee replacement Right 05/2013    done at [facility]---Dr. Palin    Outpatient Encounter Prescriptions as of 01/20/2016  Medication Sig  . aspirin 81 MG EC tablet Take 81 mg by mouth daily.    . cetirizine (ZYRTEC) 10 MG chewable tablet Chew 10 mg by mouth 2 (two) times daily.   . Cholecalciferol 1000 UNITS capsule Take 1,000 Units by mouth daily.    Marland Kitchen dextromethorphan-guaiFENesin (MUCINEX DM) 30-600 MG per 12 hr tablet Take 2 tablets by mouth every 12 (twelve) hours.   . Multiple  Vitamin (MULTIVITAMIN) capsule Take 1 capsule by mouth daily.    . niacin 500 MG tablet Take 500 mg by mouth at bedtime.  . NON FORMULARY doterra essential oil Mixes in water  . Red Yeast Rice 600 MG CAPS Take 1 capsule by mouth daily.  . [DISCONTINUED] azelastine (ASTELIN) 0.1 % nasal spray Place 1 spray into both nostrils 2 (two) times daily. Use in each nostril as directed  . [DISCONTINUED] Beclomethasone Dipropionate (QNASL) 80 MCG/ACT AERS Place 2 sprays into the nose at bedtime.  . [DISCONTINUED] budesonide-formoterol (SYMBICORT) 160-4.5 MCG/ACT inhaler Inhale 2 puffs into the lungs 2 (two) times daily.  . [DISCONTINUED] cefdinir (OMNICEF) 300 MG capsule Take 1 capsule (300 mg total) by mouth 2 (two) times daily. (Patient not taking: Reported on 01/20/2016)   No facility-administered encounter medications on file as of 01/20/2016.    Allergies  Allergen Reactions  . Amoxicillin-Pot Clavulanate     REACTION: severe abd pain---intolerant    Review of Systems        See HPI - all other systems neg except as noted... The patient complains of decreased hearing and dyspnea on exertion.  The patient denies anorexia, fever, weight loss, weight gain, vision loss, hoarseness, chest pain, syncope, peripheral edema, prolonged cough, headaches, hemoptysis, abdominal pain, melena, hematochezia, severe indigestion/heartburn, hematuria, incontinence, muscle weakness, suspicious skin lesions, transient blindness, difficulty walking, depression, unusual weight change, abnormal bleeding, enlarged lymph nodes, and angioedema.     Objective:   Physical Exam    WD, sl overweight, 65 y/o WF in NAD...  GENERAL:  Alert & oriented; pleasant & cooperative... HEENT:  Elim/AT, EOM-wnl, PERRLA, Fundi-benign, EACs- sl red, TMs-wnl, NOSE- sl red/ inflammed, THROAT- injected, otherw wnl. NECK:  Supple w/ full ROM; no JVD; normal carotid impulses w/o bruits; no thyromegaly or nodules palpated; no  lymphadenopathy. CHEST:  Clear to P & A; without wheezes/ rales/ or rhonchi. HEART:  Regular Rhythm; without murmurs/ rubs/ or gallops. ABDOMEN:  Soft & nontender; normal bowel sounds; no organomegaly or masses detected. EXT: without deformities, mild arthritic changes; no varicose veins/ venous insuffic/ or edema. NEURO:  CN's intact; motor testing normal; sensory testing normal; gait normal & balance OK. DERM:  No lesions noted; no rash etc...  RADIOLOGY DATA:  Reviewed in the EPIC EMR & discussed w/ the patient...     LABORATORY DATA:  Reviewed in the [facility] EMR & discussed w/ the patient...   Assessment & Plan:    AR & ASTHMA w/ hx refractory AB exac>  She presented 11/15 w/ acute bronchitis & bronchitic exac=> treated w/ Depo80, Dosepak, Levaquin, + OTC meds... 01/20/16>  Stable but needs to use her Advair100Bid regularly...  OSA>  She denies sleep disordered breathing, daytime hypersomnolence, etc; She is advised to get on diet + exercise program & get wt down to avoid future problems...  CHOL>  She has intermittently shown excellent control on diet Rx, then again off diet numbers looked pretty bad; FLP now w/ LDL=126 & she doesn't want meds- discussed diet.  Abn BS>  BS & A1c look good on diet> everything depends on better diet, exercise, get wt down...  GI>  On OTC Prilosec as needed & f/u colon due now- refer to GI...  GYN/ Urine incont>  Followed by DrTomblin; Toviaz helped & she is off this med now, doing satis...  DJD>  Aware, uses OTC meds as needed...   Patient's Medications  New Prescriptions   No medications on file  Previous Medications   ASPIRIN 81 MG EC TABLET    Take 81 mg by mouth daily.     CETIRIZINE (ZYRTEC) 10 MG CHEWABLE TABLET    Chew 10 mg by mouth 2 (two) times daily.    CHOLECALCIFEROL 1000 UNITS CAPSULE    Take 1,000 Units by mouth daily.     DEXTROMETHORPHAN-GUAIFENESIN (MUCINEX DM) 30-600 MG PER 12 HR TABLET    Take 2 tablets by mouth every 12  (twelve) hours.    MULTIPLE VITAMIN (MULTIVITAMIN) CAPSULE    Take 1 capsule by mouth daily.     NIACIN 500 MG TABLET    Take 500 mg by mouth at bedtime.   NON FORMULARY    doterra essential oil Mixes in water   RED YEAST RICE 600 MG CAPS    Take 1 capsule by mouth daily.  Modified Medications   Modified Medication Previous Medication   AZELASTINE (ASTELIN) 0.1 % NASAL SPRAY azelastine (ASTELIN) 0.1 % nasal spray      Place 1 spray into both nostrils 2 (two) times daily. Use in each nostril as directed    Place 1 spray into both nostrils 2 (two) times daily. Use in each nostril as directed   BECLOMETHASONE DIPROPIONATE (QNASL) 80 MCG/ACT AERS Beclomethasone Dipropionate (QNASL) 80 MCG/ACT AERS      Place 2 sprays into the nose at bedtime.    Place 2 sprays into the nose at bedtime.   BUDESONIDE-FORMOTEROL (SYMBICORT) 160-4.5 MCG/ACT INHALER budesonide-formoterol (SYMBICORT) 160-4.5 MCG/ACT inhaler      Inhale 2 puffs into the lungs 2 (two) times daily.    Inhale 2 puffs into the lungs 2 (two) times daily.  Discontinued Medications   CEFDINIR (OMNICEF) 300 MG CAPSULE    Take 1 capsule (300 mg total) by mouth 2 (two) times daily.

## 2016-01-20 NOTE — Telephone Encounter (Signed)
Called spoke with pt. She needed refill on symbicort, qnasal and astelin. Rx's called in. Nothing further needed

## 2016-01-28 ENCOUNTER — Other Ambulatory Visit: Payer: Self-pay | Admitting: Pulmonary Disease

## 2016-01-28 DIAGNOSIS — M8580 Other specified disorders of bone density and structure, unspecified site: Secondary | ICD-10-CM

## 2016-01-28 DIAGNOSIS — R921 Mammographic calcification found on diagnostic imaging of breast: Secondary | ICD-10-CM

## 2016-07-18 DIAGNOSIS — T161XXA Foreign body in right ear, initial encounter: Secondary | ICD-10-CM | POA: Diagnosis not present

## 2016-09-05 DIAGNOSIS — E559 Vitamin D deficiency, unspecified: Secondary | ICD-10-CM | POA: Diagnosis not present

## 2016-09-05 DIAGNOSIS — R5382 Chronic fatigue, unspecified: Secondary | ICD-10-CM | POA: Insufficient documentation

## 2016-09-07 ENCOUNTER — Ambulatory Visit
Admission: RE | Admit: 2016-09-07 | Discharge: 2016-09-07 | Disposition: A | Discharge status: Home / Self Care | Payer: Medicare Other | Source: Ambulatory Visit | Attending: Pulmonary Disease | Admitting: Pulmonary Disease

## 2016-09-07 DIAGNOSIS — M8589 Other specified disorders of bone density and structure, multiple sites: Secondary | ICD-10-CM | POA: Diagnosis not present

## 2016-09-07 DIAGNOSIS — R921 Mammographic calcification found on diagnostic imaging of breast: Secondary | ICD-10-CM | POA: Diagnosis not present

## 2016-09-07 DIAGNOSIS — Z780 Asymptomatic menopausal state: Secondary | ICD-10-CM | POA: Diagnosis not present

## 2016-09-07 DIAGNOSIS — M8580 Other specified disorders of bone density and structure, unspecified site: Secondary | ICD-10-CM

## 2016-09-07 LAB — HM DEXA SCAN

## 2017-01-09 DIAGNOSIS — H66001 Acute suppurative otitis media without spontaneous rupture of ear drum, right ear: Secondary | ICD-10-CM | POA: Diagnosis not present

## 2017-01-09 DIAGNOSIS — R030 Elevated blood-pressure reading, without diagnosis of hypertension: Secondary | ICD-10-CM | POA: Diagnosis not present

## 2017-06-27 DIAGNOSIS — Z471 Aftercare following joint replacement surgery: Secondary | ICD-10-CM | POA: Diagnosis not present

## 2017-06-27 DIAGNOSIS — Z96651 Presence of right artificial knee joint: Secondary | ICD-10-CM | POA: Diagnosis not present

## 2017-06-27 DIAGNOSIS — M25561 Pain in right knee: Secondary | ICD-10-CM | POA: Diagnosis not present

## 2017-06-27 DIAGNOSIS — Z9889 Other specified postprocedural states: Secondary | ICD-10-CM | POA: Diagnosis not present

## 2017-06-27 DIAGNOSIS — M25662 Stiffness of left knee, not elsewhere classified: Secondary | ICD-10-CM | POA: Diagnosis not present

## 2017-06-27 DIAGNOSIS — M25562 Pain in left knee: Secondary | ICD-10-CM | POA: Diagnosis not present

## 2017-06-27 DIAGNOSIS — J42 Unspecified chronic bronchitis: Secondary | ICD-10-CM | POA: Diagnosis not present

## 2017-06-27 DIAGNOSIS — M1712 Unilateral primary osteoarthritis, left knee: Secondary | ICD-10-CM | POA: Diagnosis not present

## 2017-06-27 DIAGNOSIS — Z96653 Presence of artificial knee joint, bilateral: Secondary | ICD-10-CM | POA: Diagnosis not present

## 2017-06-27 DIAGNOSIS — Z96652 Presence of left artificial knee joint: Secondary | ICD-10-CM | POA: Diagnosis not present

## 2017-08-14 DIAGNOSIS — M1712 Unilateral primary osteoarthritis, left knee: Secondary | ICD-10-CM | POA: Diagnosis not present

## 2017-08-14 DIAGNOSIS — Z96652 Presence of left artificial knee joint: Secondary | ICD-10-CM | POA: Diagnosis not present

## 2017-08-14 DIAGNOSIS — M25562 Pain in left knee: Secondary | ICD-10-CM | POA: Diagnosis not present

## 2017-08-14 DIAGNOSIS — G8929 Other chronic pain: Secondary | ICD-10-CM | POA: Diagnosis not present

## 2017-10-30 ENCOUNTER — Other Ambulatory Visit: Payer: Self-pay | Admitting: Pulmonary Disease

## 2017-10-30 DIAGNOSIS — Z139 Encounter for screening, unspecified: Secondary | ICD-10-CM

## 2017-11-29 ENCOUNTER — Ambulatory Visit: Payer: Medicare Other

## 2017-12-29 ENCOUNTER — Telehealth: Payer: Self-pay | Admitting: Pulmonary Disease

## 2017-12-29 ENCOUNTER — Ambulatory Visit (INDEPENDENT_AMBULATORY_CARE_PROVIDER_SITE_OTHER): Payer: Medicare Other | Admitting: Adult Health

## 2017-12-29 ENCOUNTER — Encounter: Payer: Self-pay | Admitting: Adult Health

## 2017-12-29 DIAGNOSIS — J209 Acute bronchitis, unspecified: Secondary | ICD-10-CM | POA: Diagnosis not present

## 2017-12-29 MED ORDER — ALBUTEROL SULFATE HFA 108 (90 BASE) MCG/ACT IN AERS: 2.0000 | INHALATION_SPRAY | Freq: Four times a day (QID) | RESPIRATORY_TRACT | 5 refills | Status: DC | PRN

## 2017-12-29 MED ORDER — BUDESONIDE-FORMOTEROL FUMARATE 160-4.5 MCG/ACT IN AERO: 2.0000 | INHALATION_SPRAY | Freq: Two times a day (BID) | RESPIRATORY_TRACT | 0 refills | Status: DC

## 2017-12-29 MED ORDER — PREDNISONE 10 MG PO TABS: ORAL_TABLET | ORAL | 0 refills | Status: DC

## 2017-12-29 MED ORDER — CEFDINIR 300 MG PO CAPS: 300.0000 mg | ORAL_CAPSULE | Freq: Two times a day (BID) | ORAL | 0 refills | Status: DC

## 2017-12-29 NOTE — Patient Instructions (Signed)
Omnicef 300mg  Twice daily  For 1 week.  Prednisone taper over next week .  Mucinex DM Twice daily  As needed  Cough/congestion  Saline nasal rinses As needed   Fluids and rest  Please contact office for sooner follow up if symptoms do not improve or worsen or seek emergency care  Follow up Dr. Lenna Gilford  As planned and As needed

## 2017-12-29 NOTE — Telephone Encounter (Signed)
Called and spoke with patient, she is scheduled to see TP this afternoon for sinus issues. Nothing further needed.

## 2017-12-29 NOTE — Assessment & Plan Note (Signed)
[facility]   Plan  Patient Instructions  Omnicef 300mg  Twice daily  For 1 week.  Prednisone taper over next week .  Mucinex DM Twice daily  As needed  Cough/congestion  Saline nasal rinses As needed   Fluids and rest  Please contact office for sooner follow up if symptoms do not improve or worsen or seek emergency care  Follow up Dr. Lenna Gilford  As planned and As needed

## 2017-12-29 NOTE — Addendum Note (Signed)
Addended by: Georjean Mode on: 12/29/2017 05:21 PM   Modules accepted: Orders

## 2017-12-29 NOTE — Addendum Note (Signed)
Addended by: Parke Poisson E on: 12/29/2017 05:30 PM   Modules accepted: Orders

## 2017-12-29 NOTE — Progress Notes (Signed)
@Patient  ID: Carrie Perkins, female    DOB: January 25, 1951, 67 y.o.   MRN: [id number]  Chief Complaint  Patient presents with  . Acute Visit    COPD     Referring provider: Noralee Space, MD  HPI: 67 year old female never smoker followed for asthma  12/29/2017 Acute OV : Asthma  Pt presents for an acute office visit . Complains of 1 week cough , congestion  , sore throat , sinus drainage, wheezing . Taking mucinex and zyrtec without much help.  Cough is keeping her up at night .  Appetite is good . No n/v.d   Remains on Symbicort Twice daily  , says it is expensive.    Allergies  Allergen Reactions  . Amoxicillin-Pot Clavulanate     REACTION: severe abd pain---intolerant    Immunization History  Administered Date(s) Administered  . Influenza Split 12/12/2011  . Influenza Whole 08/14/2009  . Tdap 11/14/2010    Past Medical History:  Diagnosis Date  . Allergic rhinitis, cause unspecified   . Esophageal reflux   . Obstructive sleep apnea (adult) (pediatric)   . Osteoarthrosis, unspecified whether generalized or localized, unspecified site   . Other and unspecified hyperlipidemia   . Other dyspnea and respiratory abnormality   . Overweight(278.02)   . Unspecified asthma(493.90)   . Unspecified urinary incontinence     Tobacco History: Social History   Tobacco Use  Smoking Status Never Smoker  Smokeless Tobacco Never Used   Counseling given: Not Answered   Outpatient Encounter Medications as of 12/29/2017  Medication Sig  . aspirin 81 MG EC tablet Take 81 mg by mouth daily.    Marland Kitchen azelastine (ASTELIN) 0.1 % nasal spray Place 1 spray into both nostrils 2 (two) times daily. Use in each nostril as directed  . Beclomethasone Dipropionate (QNASL) 80 MCG/ACT AERS Place 2 sprays into the nose at bedtime.  . budesonide-formoterol (SYMBICORT) 160-4.5 MCG/ACT inhaler Inhale 2 puffs into the lungs 2 (two) times daily.  . cetirizine (ZYRTEC) 10 MG chewable tablet Chew 10 mg by  mouth 2 (two) times daily.   . Cholecalciferol 1000 UNITS capsule Take 1,000 Units by mouth daily.    Marland Kitchen dextromethorphan-guaiFENesin (MUCINEX DM) 30-600 MG per 12 hr tablet Take 2 tablets by mouth every 12 (twelve) hours.   . Multiple Vitamin (MULTIVITAMIN) capsule Take 1 capsule by mouth daily.    . niacin 500 MG tablet Take 500 mg by mouth at bedtime.  . NON FORMULARY doterra essential oil Mixes in water  . Red Yeast Rice 600 MG CAPS Take 1 capsule by mouth daily.  . cefdinir (OMNICEF) 300 MG capsule Take 1 capsule (300 mg total) by mouth 2 (two) times daily.  . predniSONE (DELTASONE) 10 MG tablet 4 tabs for 2 days, then 3 tabs for 2 days, 2 tabs for 2 days, then 1 tab for 2 days, then stop   No facility-administered encounter medications on file as of 12/29/2017.      Review of Systems  Constitutional:   No  weight loss, night sweats,  Fevers, chills, + fatigue, or  lassitude.  HEENT:   No headaches,  Difficulty swallowing,  Tooth/dental problems, or  Sore throat,                No sneezing, itching, ear ache,  +nasal congestion, post nasal drip,   CV:  No chest pain,  Orthopnea, PND, swelling in lower extremities, anasarca, dizziness, palpitations, syncope.   GI  No heartburn, indigestion,  abdominal pain, nausea, vomiting, diarrhea, change in bowel habits, loss of appetite, bloody stools.   Resp: No shortness of breath with exertion or at rest.  No excess mucus, no productive cough,  No non-productive cough,  No coughing up of blood.  No change in color of mucus.  No wheezing.  No chest wall deformity  Skin: no rash or lesions.  GU: no dysuria, change in color of urine, no urgency or frequency.  No flank pain, no hematuria   MS:  No joint pain or swelling.  No decreased range of motion.  No back pain.    Physical Exam  BP 134/80 (BP Location: Left Arm, Cuff Size: Normal)   Pulse 89   Ht 5\' 6"  (1.676 m)   Wt 221 lb (100.2 kg)   SpO2 95%   BMI 35.67 kg/m   GEN: A/Ox3;  pleasant , NAD, obese    HEENT:  [place]/AT,  EACs-clear, TMs-wnl, NOSE-clear drainage , THROAT-clear, no lesions, no postnasal drip or exudate noted.   NECK:  Supple w/ fair ROM; no JVD; normal carotid impulses w/o bruits; no thyromegaly or nodules palpated; no lymphadenopathy.    RESP  Few trace rhonchi  no accessory muscle use, no dullness to percussion  CARD:  RRR, no m/r/g, no peripheral edema, pulses intact, no cyanosis or clubbing.  GI:   Soft & nt; nml bowel sounds; no organomegaly or masses detected.   Musco: Warm bil, no deformities or joint swelling noted.   Neuro: alert, no focal deficits noted.    Skin: Warm, no lesions or rashes    Lab Results:  CBC    Component Value Date/Time   WBC 6.5 01/20/2016 1000   RBC 4.99 01/20/2016 1000   HGB 14.8 01/20/2016 1000   HCT 42.2 01/20/2016 1000   PLT 284.0 01/20/2016 1000   MCV 84.4 01/20/2016 1000   MCHC 35.2 01/20/2016 1000   RDW 13.7 01/20/2016 1000   LYMPHSABS 2.6 01/20/2016 1000   MONOABS 0.5 01/20/2016 1000   EOSABS 0.2 01/20/2016 1000   BASOSABS 0.1 01/20/2016 1000    BMET    Component Value Date/Time   NA 139 01/20/2016 1000   K 4.0 01/20/2016 1000   CL 102 01/20/2016 1000   CO2 28 01/20/2016 1000   GLUCOSE 131 (H) 01/20/2016 1000   BUN 12 01/20/2016 1000   CREATININE 0.62 01/20/2016 1000   CALCIUM 9.5 01/20/2016 1000   GFRNONAA 102.19 05/11/2010 0915    BNP No results found for: BNP  ProBNP No results found for: PROBNP  Imaging: No results found.   Assessment & Plan:   BRONCHITIS, ACUTE Flare   Plan  Patient Instructions  Omnicef 300mg  Twice daily  For 1 week.  Prednisone taper over next week .  Mucinex DM Twice daily  As needed  Cough/congestion  Saline nasal rinses As needed   Fluids and rest  Please contact office for sooner follow up if symptoms do not improve or worsen or seek emergency care  Follow up Dr. Lenna Gilford  As planned and As needed           Rexene Edison,  NP 12/29/2017

## 2019-01-08 ENCOUNTER — Encounter: Payer: Self-pay | Admitting: Adult Health

## 2019-01-08 ENCOUNTER — Ambulatory Visit (INDEPENDENT_AMBULATORY_CARE_PROVIDER_SITE_OTHER): Payer: Medicare Other | Admitting: Adult Health

## 2019-01-08 DIAGNOSIS — J4531 Mild persistent asthma with (acute) exacerbation: Secondary | ICD-10-CM | POA: Diagnosis not present

## 2019-01-08 DIAGNOSIS — J209 Acute bronchitis, unspecified: Secondary | ICD-10-CM | POA: Diagnosis not present

## 2019-01-08 MED ORDER — AZELASTINE HCL 0.1 % NA SOLN: 1.0000 | Freq: Two times a day (BID) | NASAL | 3 refills | Status: DC

## 2019-01-08 MED ORDER — PREDNISONE 10 MG PO TABS: ORAL_TABLET | ORAL | 0 refills | Status: DC

## 2019-01-08 MED ORDER — BUDESONIDE-FORMOTEROL FUMARATE 160-4.5 MCG/ACT IN AERO: 2.0000 | INHALATION_SPRAY | Freq: Two times a day (BID) | RESPIRATORY_TRACT | 0 refills | Status: DC

## 2019-01-08 MED ORDER — BECLOMETHASONE DIPROPIONATE 80 MCG/ACT NA AERS: 2.0000 | INHALATION_SPRAY | Freq: Every day | NASAL | 3 refills | Status: DC

## 2019-01-08 MED ORDER — CEFDINIR 300 MG PO CAPS: 300.0000 mg | ORAL_CAPSULE | Freq: Two times a day (BID) | ORAL | 0 refills | Status: DC

## 2019-01-08 NOTE — Progress Notes (Signed)
@Patient  ID: Carrie Perkins, female    DOB: 1951-11-13, 68 y.o.   MRN: [id number]  Chief Complaint  Patient presents with  . Acute Visit    Cough     Referring provider: Noralee Space, MD  HPI: 68 year old female never smoker followed for asthma  TEST/EVENTS :   01/08/2019 Acute OV : Asthma  Patient presents for an acute office visit.  Patient complains over the last 3 weeks that she has had increased cough congestion with thick mucus. She has tried several over-the-counter cold medications, vitamin C and orange juice. She has used her albuterol inhaler several times. No recent travel or antibiotic use.  Remains on Symbicort.   Going to [place] in few weeks.    Allergies  Allergen Reactions  . Amoxicillin-Pot Clavulanate     REACTION: severe abd pain---intolerant    Immunization History  Administered Date(s) Administered  . Influenza Split 12/12/2011  . Influenza Whole 08/14/2009  . Tdap 11/14/2010    Past Medical History:  Diagnosis Date  . Allergic rhinitis, cause unspecified   . Esophageal reflux   . Obstructive sleep apnea (adult) (pediatric)   . Osteoarthrosis, unspecified whether generalized or localized, unspecified site   . Other and unspecified hyperlipidemia   . Other dyspnea and respiratory abnormality   . Overweight(278.02)   . Unspecified asthma(493.90)   . Unspecified urinary incontinence     Tobacco History: Social History   Tobacco Use  Smoking Status Never Smoker  Smokeless Tobacco Never Used   Counseling given: Not Answered   Outpatient Medications Prior to Visit  Medication Sig Dispense Refill  . albuterol (PROAIR HFA) 108 (90 Base) MCG/ACT inhaler Inhale 2 puffs into the lungs every 6 (six) hours as needed for wheezing or shortness of breath. 1 Inhaler 5  . aspirin 81 MG EC tablet Take 81 mg by mouth daily.      . budesonide-formoterol (SYMBICORT) 160-4.5 MCG/ACT inhaler Inhale 2 puffs into the lungs 2 (two) times daily. 3 Inhaler 3  .  cetirizine (ZYRTEC) 10 MG chewable tablet Chew 10 mg by mouth 2 (two) times daily.     . Cholecalciferol 1000 UNITS capsule Take 1,000 Units by mouth daily.      Marland Kitchen dextromethorphan-guaiFENesin (MUCINEX DM) 30-600 MG per 12 hr tablet Take 2 tablets by mouth every 12 (twelve) hours.     . Multiple Vitamin (MULTIVITAMIN) capsule Take 1 capsule by mouth daily.      . NON FORMULARY doterra essential oil Mixes in water    . Red Yeast Rice 600 MG CAPS Take 1 capsule by mouth daily.    Marland Kitchen azelastine (ASTELIN) 0.1 % nasal spray Place 1 spray into both nostrils 2 (two) times daily. Use in each nostril as directed 90 mL 3  . Beclomethasone Dipropionate (QNASL) 80 MCG/ACT AERS Place 2 sprays into the nose at bedtime. 3 Inhaler 3  . niacin 500 MG tablet Take 500 mg by mouth at bedtime.    . budesonide-formoterol (SYMBICORT) 160-4.5 MCG/ACT inhaler Inhale 2 puffs into the lungs 2 (two) times daily. 1 Inhaler 0  . cefdinir (OMNICEF) 300 MG capsule Take 1 capsule (300 mg total) by mouth 2 (two) times daily. (Patient not taking: Reported on 01/08/2019) 14 capsule 0  . predniSONE (DELTASONE) 10 MG tablet 4 tabs for 2 days, then 3 tabs for 2 days, 2 tabs for 2 days, then 1 tab for 2 days, then stop (Patient not taking: Reported on 01/08/2019) 20 tablet 0   No  facility-administered medications prior to visit.      Review of Systems:   Constitutional:   No  weight loss, night sweats,  Fevers, chills, fatigue, or  lassitude.  HEENT:   No headaches,  Difficulty swallowing,  Tooth/dental problems, or  Sore throat,                No sneezing, itching, ear ache,  +nasal congestion, post nasal drip,   CV:  No chest pain,  Orthopnea, PND, swelling in lower extremities, anasarca, dizziness, palpitations, syncope.   GI  No heartburn, indigestion, abdominal pain, nausea, vomiting, diarrhea, change in bowel habits, loss of appetite, bloody stools.   Resp:  No chest wall deformity  Skin: no rash or lesions.  GU: no  dysuria, change in color of urine, no urgency or frequency.  No flank pain, no hematuria   MS:  No joint pain or swelling.  No decreased range of motion.  No back pain.    Physical Exam  BP 110/68 (BP Location: Left Arm, Cuff Size: Normal)   Pulse 91   Temp 98.8 F (37.1 C) (Oral)   Ht 5\' 6"  (1.676 m)   Wt 220 lb 12.8 oz (100.2 kg)   SpO2 95%   BMI 35.64 kg/m   GEN: A/Ox3; pleasant , NAD, obese    HEENT:  [place]/AT,  EACs-clear, TMs-wnl, NOSE-clear drainage , THROAT-clear, no lesions, no postnasal drip or exudate noted.   NECK:  Supple w/ fair ROM; no JVD; normal carotid impulses w/o bruits; no thyromegaly or nodules palpated; no lymphadenopathy.    RESP  Clear  P & A; w/o, wheezes/ rales/ or rhonchi. no accessory muscle use, no dullness to percussion. Speaks in full sentences   CARD:  RRR, no m/r/g, no peripheral edema, pulses intact, no cyanosis or clubbing.  GI:   Soft & nt; nml bowel sounds; no organomegaly or masses detected.   Musco: Warm bil, no deformities or joint swelling noted.   Neuro: alert, no focal deficits noted.    Skin: Warm, no lesions or rashes    Lab Results:  CBC  BNP No results found for: BNP  ProBNP No results found for: PROBNP  Imaging: No results found.    No flowsheet data found.  No results found for: NITRICOXIDE      Assessment & Plan:   BRONCHITIS, ACUTE Flare   Plan  Patient Instructions  Omnicef 300mg  Twice daily  For 1 week.  Prednisone taper - to have on hold if wheezing develops.  Mucinex DM Twice daily  As needed  Cough/congestion  Saline nasal rinses As needed   Fluids and rest  Continue on Symbiocrt 2 puffs Twice daily  , please get formulary so we can choose something is covered.  Refer to [facility] to establish for Primary Care.  Follow up in 1 year with Dr. Valeta Harms or Parrett NP and As needed   Please contact office for sooner follow up if symptoms do not improve or worsen or seek emergency care          Asthma Mild flare with bronchitis   Plan  Patient Instructions  Omnicef 300mg  Twice daily  For 1 week.  Prednisone taper - to have on hold if wheezing develops.  Mucinex DM Twice daily  As needed  Cough/congestion  Saline nasal rinses As needed   Fluids and rest  Continue on Symbiocrt 2 puffs Twice daily  , please get formulary so we can choose something is covered.  Refer to [facility] to establish for Primary Care.  Follow up in 1 year with Dr. Valeta Harms or Parrett NP and As needed   Please contact office for sooner follow up if symptoms do not improve or worsen or seek emergency care           Rexene Edison, NP 01/08/2019

## 2019-01-08 NOTE — Patient Instructions (Addendum)
Omnicef 300mg  Twice daily  For 1 week.  Prednisone taper - to have on hold if wheezing develops.  Mucinex DM Twice daily  As needed  Cough/congestion  Saline nasal rinses As needed   Fluids and rest  Continue on Symbiocrt 2 puffs Twice daily  , please get formulary so we can choose something is covered.  Refer to [facility] to establish for Primary Care.  Follow up in 1 year with Dr. Valeta Harms or Parrett NP and As needed   Please contact office for sooner follow up if symptoms do not improve or worsen or seek emergency care

## 2019-01-08 NOTE — Assessment & Plan Note (Signed)
Mild flare with bronchitis   Plan  Patient Instructions  Omnicef 300mg  Twice daily  For 1 week.  Prednisone taper - to have on hold if wheezing develops.  Mucinex DM Twice daily  As needed  Cough/congestion  Saline nasal rinses As needed   Fluids and rest  Continue on Symbiocrt 2 puffs Twice daily  , please get formulary so we can choose something is covered.  Refer to [facility] to establish for Primary Care.  Follow up in 1 year with Dr. Valeta Harms or Rumaldo Difatta NP and As needed   Please contact office for sooner follow up if symptoms do not improve or worsen or seek emergency care

## 2019-01-08 NOTE — Assessment & Plan Note (Signed)
Flare   Plan  Patient Instructions  Omnicef 300mg  Twice daily  For 1 week.  Prednisone taper - to have on hold if wheezing develops.  Mucinex DM Twice daily  As needed  Cough/congestion  Saline nasal rinses As needed   Fluids and rest  Continue on Symbiocrt 2 puffs Twice daily  , please get formulary so we can choose something is covered.  Refer to [facility] to establish for Primary Care.  Follow up in 1 year with Dr. Valeta Harms or Thelma Viana NP and As needed   Please contact office for sooner follow up if symptoms do not improve or worsen or seek emergency care

## 2019-01-09 NOTE — Progress Notes (Signed)
PCCM: Agree. Thanks and I will be glad to establish care.  Garner Nash, DO [facility] 01/09/2019 5:09 PM  Personal pager: [phone] If unanswered, please page CCM On-call: [phone]

## 2019-01-30 ENCOUNTER — Telehealth: Payer: Self-pay | Admitting: Adult Health

## 2019-01-30 NOTE — Telephone Encounter (Signed)
Medication name and strength: Qnasl 51mcg Nasal Aerosol Provider: [place]: [facility] Patient insurance ID: [id number] Phone: [phone]  Was the PA started on CMM?  Today 01/30/2019  Timeframe for approval/denial: (Key: [id number])  Need help? Call us at [phone]   Sent to [employer] plan will fax you a determination, typically within 1 to 5 business days. Drug-Qnasl 80MCG/ACT aerosol  [employer] Medicare Prescription Drug Coverage Determination Form  Use for Medicare Prescription Drug Coverage Determination Requests [phone]  Routing message to [place] for review.

## 2019-02-06 NOTE — Telephone Encounter (Signed)
Went to cover my meds and there was no response.  It stated they will fax [place] a response.  Will check fax.

## 2019-02-13 ENCOUNTER — Encounter: Payer: Self-pay | Admitting: Adult Health

## 2019-02-13 ENCOUNTER — Ambulatory Visit (INDEPENDENT_AMBULATORY_CARE_PROVIDER_SITE_OTHER): Payer: Medicare Other | Admitting: Adult Health

## 2019-02-13 ENCOUNTER — Other Ambulatory Visit: Payer: Self-pay

## 2019-02-13 ENCOUNTER — Telehealth: Payer: Self-pay | Admitting: Adult Health

## 2019-02-13 DIAGNOSIS — J00 Acute nasopharyngitis [common cold]: Secondary | ICD-10-CM

## 2019-02-13 DIAGNOSIS — J4541 Moderate persistent asthma with (acute) exacerbation: Secondary | ICD-10-CM | POA: Diagnosis not present

## 2019-02-13 MED ORDER — AZITHROMYCIN 250 MG PO TABS: ORAL_TABLET | ORAL | 0 refills | Status: AC

## 2019-02-13 NOTE — Patient Instructions (Addendum)
Zpack to have on hold if symptoms worsen with discolored mucus.  Mucinex DM Twice daily  As needed  Cough/congestion  Saline nasal rinses As needed   Fluids and rest  Hold essential oils.  Continue on Symbiocrt 2 puffs Twice daily  , please get formulary so we can choose something is covered.  Refer to [facility] to establish for Primary Care.  Follow up in office in 2-3 months with Dr. Valeta Harms with chest xray and PFT and As needed    Please contact office for sooner follow up if symptoms do not improve or worsen or seek emergency care

## 2019-02-13 NOTE — Telephone Encounter (Signed)
Please add for televisit later today

## 2019-02-13 NOTE — Telephone Encounter (Signed)
Primary Pulmonologist: Former  SN pt/TP Last office visit and with whom: 01/08/2019 with TP What do we see them for (pulmonary problems): acute bronchitis Last OV assessment/plan: Instructions  Return in about 1 year (around 01/09/2020).  Omnicef 300mg  Twice daily  For 1 week.  Prednisone taper - to have on hold if wheezing develops.  Mucinex DM Twice daily  As needed  Cough/congestion  Saline nasal rinses As needed   Fluids and rest  Continue on Symbiocrt 2 puffs Twice daily  , please get formulary so we can choose something is covered.  Refer to [facility] to establish for Primary Care.  Follow up in 1 year with Dr. Valeta Harms or Parrett NP and As needed   Please contact office for sooner follow up if symptoms do not improve or worsen or seek emergency care        Was appointment offered to patient (explain)?  Pt requesting another round of abx   Reason for call: Called and spoke with pt who stated she saw TP 3 weeks ago 01/08/2019 and was prescribed abx and prednisone due to bronchitis. Pt states she is still coughing and is coughing up yellow phlegm which she said is a dark yellow. Pt denies any wheezing, increased SOB, or chest tightness. Pt also denies any fever.  Due to pt still coughing and still coughing up yellow phlegm, she is wanting to know if another round of abx can be called in for her. Tammy, please advise on this for pt. Thanks!

## 2019-02-13 NOTE — Telephone Encounter (Signed)
Received PA on [email or web address]. The wrong form was used initially. Started PA over with the correct form. New key is [id number].

## 2019-02-13 NOTE — Progress Notes (Signed)
Virtual Visit via Telephone Note  I connected with Andree Elk on 02/13/19 at  3:00 PM EDT by telephone and verified that I am speaking with the correct person using two identifiers.   I discussed the limitations, risks, security and privacy concerns of performing an evaluation and management service by telephone and the availability of in person appointments. I also discussed with the patient that there may be a patient responsible charge related to this service. The patient expressed understanding and agreed to proceed.   History of Present Illness: 68 year old female never smoker followed for asthma  Today's tele-visit is for an acute office visit for asthma. Patient was seen in the office 1 month ago for an acute asthmatic bronchitic exacerbation.  She was treated with Omnicef for 1 week and a prednisone taper.  Patient says she felt much better. Had minimal cough with clear mucus. Has been doing well until last few days , noticing in am has cough with thick white/yellow mucus . Once she gets this up she is better and feels good during daytimes.  Does have more sinus drainage.  No wheezing , no fever, hemoptysis ,  Taking mucinex , zyrtec and Q nasal .  Patient has underlying asthma and is on Symbicort 2 puffs twice daily.Marland Kitchen denies wheezing or dyspnea.  No recent travel . No fever.  Does work fulltime at [employer].  Drinks essential oils .At bedtime  .     Observations/Objective: 2D echo 2015 EF 32-54%, 2 diastolic dysfunction   Assessment and Plan: Asthmatic Bronchitic exacerbation -recent flare -seems to be resolving . Post nasal drainage is major trigger.  Needs chest xray and PFT on return   Acute rhinitis flare - allergic in nature  Trigger prevention   Plan  Patient Instructions  Zpack to have on hold if symptoms worsen with discolored mucus.  Mucinex DM Twice daily  As needed  Cough/congestion  Saline nasal rinses As needed   Fluids and rest  Hold essential oils.  Continue  on Symbiocrt 2 puffs Twice daily  , please get formulary so we can choose something is covered.  Refer to [facility] to establish for Primary Care.  Follow up in office in 2-3 months with Dr. Valeta Harms with chest xray and PFT and As needed    Please contact office for sooner follow up if symptoms do not improve or worsen or seek emergency care        Follow Up Instructions: Follow up in office in 2-3 months with Dr. Valeta Harms with chest xray and PFT and As needed    Please contact office for sooner follow up if symptoms do not improve or worsen or seek emergency care      I discussed the assessment and treatment plan with the patient. The patient was provided an opportunity to ask questions and all were answered. The patient agreed with the plan and demonstrated an understanding of the instructions.   The patient was advised to call back or seek an in-person evaluation if the symptoms worsen or if the condition fails to improve as anticipated.  I provided 21 minutes of non-face-to-face time during this encounter. Patient present for today's visit at home, myself present for today's visit in office  Rexene Edison, NP

## 2019-02-13 NOTE — Telephone Encounter (Signed)
Called and spoke with pt stating to her that TP wants [place] to schedule her a televisit to further address/evaluate symptoms. Pt expressed understanding. televisit scheduled with TP at 12pm. Nothing further needed.

## 2019-02-13 NOTE — Telephone Encounter (Signed)
Per Coverm[employer]eds, PA will take up to 72 hours. Will route to myself for follow up.

## 2019-02-14 ENCOUNTER — Other Ambulatory Visit: Payer: Self-pay

## 2019-02-14 ENCOUNTER — Telehealth: Payer: Self-pay

## 2019-02-14 NOTE — Telephone Encounter (Signed)
PA # [id number]. Form received stating PA has been initiated but more information is needed. Nursing info filled out. Form placed in TP look at box. Will update chart once form has been filled out.

## 2019-02-17 NOTE — Progress Notes (Signed)
PCCM: Thanks for calling Garner Nash, DO [facility] 02/17/2019 3:49 PM

## 2019-02-18 NOTE — Telephone Encounter (Signed)
LVM for patient to return call regarding TP recommendations. X1 Tammy wants her to take Flonase instead of that Qnasl.

## 2019-02-18 NOTE — Telephone Encounter (Signed)
The PA [id number] for Symbicort 160 Inhaler was denied and additional paperwork is needing to be signed by TP. This is in her look out cubby. Routing message to [place] Janett Billow to see if forms have been completed and signed.  Please advise. Thank you

## 2019-02-20 NOTE — Telephone Encounter (Signed)
Called pt to inform her of this and TP's recommendations. Pt states she prefers Qnasl and expressed misunderstanding as to why she could not get it covered. I explained to her that we had attempted PA of Qnasl 01/30/2019 and as of 02/13/2019 there has been no determination. She stated Flonase has not worked for her in the past. I offered to route a message to TP, but she refused, thanked me, and ended the call.   Routing message to TP as an FYI.

## 2019-02-20 NOTE — Telephone Encounter (Signed)
ok 

## 2019-02-21 NOTE — Telephone Encounter (Signed)
The plan will fax you a determination, typically within 1 to 5 business days. 

## 2019-09-19 ENCOUNTER — Ambulatory Visit: Payer: Medicare Other | Admitting: Family Medicine

## 2019-09-25 ENCOUNTER — Ambulatory Visit: Payer: Medicare Other | Admitting: Family Medicine

## 2019-10-03 ENCOUNTER — Encounter: Payer: Self-pay | Admitting: Family Medicine

## 2019-10-03 ENCOUNTER — Ambulatory Visit (INDEPENDENT_AMBULATORY_CARE_PROVIDER_SITE_OTHER): Payer: Medicare Other | Admitting: Family Medicine

## 2019-10-03 ENCOUNTER — Other Ambulatory Visit: Payer: Self-pay

## 2019-10-03 VITALS — BP 146/94 | HR 85 | Temp 97.8°F | Ht 65.0 in | Wt 217.5 lb

## 2019-10-03 DIAGNOSIS — Z1211 Encounter for screening for malignant neoplasm of colon: Secondary | ICD-10-CM

## 2019-10-03 DIAGNOSIS — R030 Elevated blood-pressure reading, without diagnosis of hypertension: Secondary | ICD-10-CM | POA: Diagnosis not present

## 2019-10-03 DIAGNOSIS — R7303 Prediabetes: Secondary | ICD-10-CM | POA: Diagnosis not present

## 2019-10-03 DIAGNOSIS — E782 Mixed hyperlipidemia: Secondary | ICD-10-CM | POA: Diagnosis not present

## 2019-10-03 DIAGNOSIS — Z6836 Body mass index (BMI) 36.0-36.9, adult: Secondary | ICD-10-CM | POA: Diagnosis not present

## 2019-10-03 DIAGNOSIS — E6609 Other obesity due to excess calories: Secondary | ICD-10-CM | POA: Insufficient documentation

## 2019-10-03 DIAGNOSIS — Z1159 Encounter for screening for other viral diseases: Secondary | ICD-10-CM

## 2019-10-03 DIAGNOSIS — E669 Obesity, unspecified: Secondary | ICD-10-CM | POA: Insufficient documentation

## 2019-10-03 DIAGNOSIS — G4733 Obstructive sleep apnea (adult) (pediatric): Secondary | ICD-10-CM | POA: Diagnosis not present

## 2019-10-03 DIAGNOSIS — E66812 Obesity, class 2: Secondary | ICD-10-CM | POA: Insufficient documentation

## 2019-10-03 DIAGNOSIS — J4531 Mild persistent asthma with (acute) exacerbation: Secondary | ICD-10-CM | POA: Diagnosis not present

## 2019-10-03 DIAGNOSIS — E661 Drug-induced obesity: Secondary | ICD-10-CM | POA: Insufficient documentation

## 2019-10-03 NOTE — Assessment & Plan Note (Signed)
No labs in 3 years. Last HgbA1c was 6.4. Will repeat today. Pt working on exercise and diet and would like to lose weight.

## 2019-10-03 NOTE — Progress Notes (Signed)
Subjective:     Carrie Perkins is a 68 y.o. female presenting for Establish Care (previous PCP Dr Lenna Gilford.)     HPI  #Asthma - gets bronchitis twice a year - does not use controller inhaler outside of infection - stills pulmonology    Review of Systems  Constitutional: Negative for chills and fever.     Social History   Tobacco Use  Smoking Status Never Smoker  Smokeless Tobacco Never Used        Objective:    BP Readings from Last 3 Encounters:  10/03/19 (!) 146/94  01/08/19 110/68  12/29/17 134/80   Wt Readings from Last 3 Encounters:  10/03/19 217 lb 8 oz (98.7 kg)  01/08/19 220 lb 12.8 oz (100.2 kg)  12/29/17 221 lb (100.2 kg)    BP (!) 146/94   Pulse 85   Temp 97.8 F (36.6 C)   Ht 5\' 5"  (1.651 m)   Wt 217 lb 8 oz (98.7 kg)   SpO2 96%   BMI 36.19 kg/m    Physical Exam Constitutional:      General: She is not in acute distress.    Appearance: She is well-developed. She is obese. She is not diaphoretic.  HENT:     Right Ear: External ear normal.     Left Ear: External ear normal.     Nose: Nose normal.  Eyes:     Conjunctiva/sclera: Conjunctivae normal.  Neck:     Musculoskeletal: Neck supple.  Cardiovascular:     Rate and Rhythm: Normal rate and regular rhythm.     Heart sounds: No murmur.  Pulmonary:     Effort: Pulmonary effort is normal. No respiratory distress.     Breath sounds: Normal breath sounds. No wheezing.  Skin:    General: Skin is warm and dry.     Capillary Refill: Capillary refill takes less than 2 seconds.  Neurological:     Mental Status: She is alert. Mental status is at baseline.  Psychiatric:        Mood and Affect: Mood normal.        Behavior: Behavior normal.           Assessment & Plan:   Problem List Items Addressed This Visit      Respiratory   Obstructive sleep apnea - Primary    Could not tolerate CPAP machine. Snores but feels rested generally      Asthma    Stable. Only using inhaler  during infections        Other   Hyperlipidemia    Mildly elevated 3 years. Repeat today. On red yeast rice. Wants to avoid statins but has never tried. Will re-evaluate risk.       Relevant Medications   aspirin EC 81 MG tablet   Other Relevant Orders   Comprehensive metabolic panel   Lipid Profile   Prediabetes    No labs in 3 years. Last HgbA1c was 6.4. Will repeat today. Pt working on exercise and diet and would like to lose weight.       Relevant Orders   Hemoglobin A1c   Class 2 obesity due to excess calories without serious comorbidity with body mass index (BMI) of 36.0 to 36.9 in adult    Labs today. Pt interested in losing weight which was encouraged.       Elevated blood pressure reading    Slightly elevated but prior were normal. Will continue to monitor.  Other Visit Diagnoses    Screening for colon cancer       Relevant Orders   Ambulatory referral to Gastroenterology   Encounter for hepatitis C screening test for low risk patient       Relevant Orders   Hepatitis C Antibody       Return if symptoms worsen or fail to improve.  Lesleigh Noe, MD

## 2019-10-03 NOTE — Assessment & Plan Note (Signed)
Could not tolerate CPAP machine. Snores but feels rested generally

## 2019-10-03 NOTE — Assessment & Plan Note (Signed)
Labs today. Pt interested in losing weight which was encouraged.

## 2019-10-03 NOTE — Assessment & Plan Note (Signed)
Stable. Only using inhaler during infections

## 2019-10-03 NOTE — Assessment & Plan Note (Signed)
Mildly elevated 3 years. Repeat today. On red yeast rice. Wants to avoid statins but has never tried. Will re-evaluate risk.

## 2019-10-03 NOTE — Assessment & Plan Note (Signed)
Slightly elevated but prior were normal. Will continue to monitor.

## 2019-10-03 NOTE — Patient Instructions (Signed)
It was a pleasure to meet you!  Come back to get labs

## 2019-10-07 ENCOUNTER — Encounter: Payer: Self-pay | Admitting: Family Medicine

## 2019-10-07 DIAGNOSIS — M8580 Other specified disorders of bone density and structure, unspecified site: Secondary | ICD-10-CM | POA: Insufficient documentation

## 2019-12-09 DIAGNOSIS — Z1211 Encounter for screening for malignant neoplasm of colon: Secondary | ICD-10-CM | POA: Diagnosis not present

## 2019-12-09 DIAGNOSIS — D120 Benign neoplasm of cecum: Secondary | ICD-10-CM | POA: Diagnosis not present

## 2019-12-09 DIAGNOSIS — K5730 Diverticulosis of large intestine without perforation or abscess without bleeding: Secondary | ICD-10-CM | POA: Diagnosis not present

## 2019-12-09 DIAGNOSIS — K5790 Diverticulosis of intestine, part unspecified, without perforation or abscess without bleeding: Secondary | ICD-10-CM | POA: Insufficient documentation

## 2019-12-09 DIAGNOSIS — D123 Benign neoplasm of transverse colon: Secondary | ICD-10-CM | POA: Diagnosis not present

## 2019-12-09 DIAGNOSIS — K635 Polyp of colon: Secondary | ICD-10-CM | POA: Diagnosis not present

## 2019-12-09 DIAGNOSIS — D125 Benign neoplasm of sigmoid colon: Secondary | ICD-10-CM | POA: Diagnosis not present

## 2019-12-09 LAB — HM COLONOSCOPY

## 2019-12-10 ENCOUNTER — Encounter: Payer: Self-pay | Admitting: Gastroenterology

## 2019-12-13 ENCOUNTER — Ambulatory Visit: Payer: Medicare Other

## 2019-12-14 DIAGNOSIS — Z860101 Personal history of adenomatous and serrated colon polyps: Secondary | ICD-10-CM | POA: Insufficient documentation

## 2019-12-14 DIAGNOSIS — Z86010 Personal history of colonic polyps: Secondary | ICD-10-CM | POA: Insufficient documentation

## 2019-12-18 ENCOUNTER — Ambulatory Visit: Payer: Medicare Other

## 2019-12-20 ENCOUNTER — Ambulatory Visit: Payer: Medicare HMO | Attending: Internal Medicine

## 2019-12-20 DIAGNOSIS — Z23 Encounter for immunization: Secondary | ICD-10-CM | POA: Insufficient documentation

## 2019-12-20 NOTE — Progress Notes (Signed)
   Covid-19 Vaccination Clinic  Name:  Jourdan Pasternak    MRN: [id number] DOB: 05-18-1951  12/20/2019  Ms. Filtz was observed post Covid-19 immunization for 15 minutes without incidence. She was provided with Vaccine Information Sheet and instruction to access the V-Safe system.   Ms. Stavropoulos was instructed to call 911 with any severe reactions post vaccine: Marland Kitchen Difficulty breathing  . Swelling of your face and throat  . A fast heartbeat  . A bad rash all over your body  . Dizziness and weakness    Immunizations Administered    Name Date Dose VIS Date Route   Pfizer COVID-19 Vaccine 12/20/2019  8:48 AM 0.3 mL 10/25/2019 Intramuscular   Manufacturer: [place]   Lot: [id number]   [place]: [id number]

## 2019-12-21 ENCOUNTER — Ambulatory Visit: Payer: Medicare Other

## 2020-01-09 ENCOUNTER — Ambulatory Visit: Payer: Medicare Other | Admitting: Pulmonary Disease

## 2020-01-13 ENCOUNTER — Encounter: Payer: Self-pay | Admitting: Adult Health

## 2020-01-13 ENCOUNTER — Ambulatory Visit (INDEPENDENT_AMBULATORY_CARE_PROVIDER_SITE_OTHER): Payer: Medicare HMO | Admitting: Adult Health

## 2020-01-13 ENCOUNTER — Ambulatory Visit (INDEPENDENT_AMBULATORY_CARE_PROVIDER_SITE_OTHER): Payer: Medicare HMO

## 2020-01-13 ENCOUNTER — Other Ambulatory Visit: Payer: Self-pay

## 2020-01-13 VITALS — BP 138/78 | HR 75 | Temp 97.1°F | Ht 66.0 in | Wt 216.8 lb

## 2020-01-13 DIAGNOSIS — J4541 Moderate persistent asthma with (acute) exacerbation: Secondary | ICD-10-CM

## 2020-01-13 DIAGNOSIS — J4531 Mild persistent asthma with (acute) exacerbation: Secondary | ICD-10-CM

## 2020-01-13 DIAGNOSIS — J309 Allergic rhinitis, unspecified: Secondary | ICD-10-CM | POA: Diagnosis not present

## 2020-01-13 DIAGNOSIS — J45901 Unspecified asthma with (acute) exacerbation: Secondary | ICD-10-CM | POA: Diagnosis not present

## 2020-01-13 NOTE — Assessment & Plan Note (Signed)
Controlled on present regimen

## 2020-01-13 NOTE — Assessment & Plan Note (Signed)
Excellent control on present regimen  Continue on trigger prevention   Plan  Patient Instructions  Continue on Symbiocrt 2 puffs Twice daily   , rinse after use.  Zyrtec 10mg  At bedtime .  ProAir As needed  Wheezing .  Activity as tolerated.  Chest xray today .  Follow up in office in 6 months with Dr. Valeta Harms with PFT and As needed

## 2020-01-13 NOTE — Progress Notes (Signed)
@Patient  ID: Carrie Perkins, female    DOB: Dec 12, 1950, 69 y.o.   MRN: [id number]  Chief Complaint  Patient presents with  . Follow-up    Asthma     Referring provider: Noralee Space, MD  HPI: 69 year old female never smoker followed for asthma and allergic rhinitis Medical history significant for diastolic dysfunction  Patient likes to travel has been all over the world.  Especially likes [place]    TEST/EVENTS :  2D echo 2015 EF 123456, 2 diastolic dysfunction   [id number] Follow up: Asthmatic and Allergic Rhinitis  Patient presents for a 1 year follow-up.  Patient has underlying asthma and allergic rhinitis.  Patient says overall she has been doing very well.  She uses Symbicort, she says she does not use it all the time but does when it on occasion when she has increased symptoms.  She says overall her breathing has been doing well she denies any flare of cough or wheezing.  She had no recent travel.  She is on Zyrtec daily.  Rare use of ProAir. Remains active and independent.   Is getting #2 Covid Vaccine this week.   Loves to travel . Going to [place] this Spring.      Allergies  Allergen Reactions  . Amoxicillin-Pot Clavulanate     REACTION: severe abd pain---intolerant  . Biaxin [Clarithromycin] Other (See Comments)    Caused severe stomach pain     Immunization History  Administered Date(s) Administered  . Influenza Split 12/12/2011  . Influenza Whole 08/14/2009  . PFIZER SARS-COV-2 Vaccination 12/20/2019  . Tdap 11/14/2010    Past Medical History:  Diagnosis Date  . Allergic rhinitis, cause unspecified   . Obstructive sleep apnea (adult) (pediatric)   . Osteoarthrosis, unspecified whether generalized or localized, unspecified site   . Other and unspecified hyperlipidemia   . Other dyspnea and respiratory abnormality   . Overweight(278.02)   . Unspecified asthma(493.90)   . Unspecified urinary incontinence     Tobacco History: Social History    Tobacco Use  Smoking Status Never Smoker  Smokeless Tobacco Never Used   Counseling given: Not Answered   Outpatient Medications Prior to Visit  Medication Sig Dispense Refill  . albuterol (PROAIR HFA) 108 (90 Base) MCG/ACT inhaler Inhale 2 puffs into the lungs every 6 (six) hours as needed for wheezing or shortness of breath. 1 Inhaler 5  . aspirin EC 81 MG tablet Take 81 mg by mouth daily.    Marland Kitchen azelastine (ASTELIN) 0.1 % nasal spray Place 1 spray into both nostrils 2 (two) times daily. Use in each nostril as directed 90 mL 3  . Beclomethasone Dipropionate (QNASL) 80 MCG/ACT AERS Place 2 sprays into the nose at bedtime. 3 Inhaler 3  . budesonide-formoterol (SYMBICORT) 160-4.5 MCG/ACT inhaler Inhale 2 puffs into the lungs 2 (two) times daily. 2 Inhaler 0  . cetirizine (ZYRTEC) 10 MG chewable tablet Chew 10 mg by mouth 2 (two) times daily.     . Cholecalciferol 1000 UNITS capsule Take 1,000 Units by mouth daily.      Marland Kitchen dextromethorphan-guaiFENesin (MUCINEX DM) 30-600 MG per 12 hr tablet Take 2 tablets by mouth every 12 (twelve) hours.     . NON FORMULARY doterra essential oil Mixes in water    . Red Yeast Rice 600 MG CAPS Take 1 capsule by mouth daily.    . Zinc Sulfate (ZINC 15 PO) Take by mouth. Not sure of the dose     No facility-administered medications prior  to visit.     Review of Systems:   Constitutional:   No  weight loss, night sweats,  Fevers, chills, fatigue, or  lassitude.  HEENT:   No headaches,  Difficulty swallowing,  Tooth/dental problems, or  Sore throat,                No sneezing, itching, ear ache,  mild nasal congestion, post nasal drip,   CV:  No chest pain,  Orthopnea, PND, swelling in lower extremities, anasarca, dizziness, palpitations, syncope.   GI  No heartburn, indigestion, abdominal pain, nausea, vomiting, diarrhea, change in bowel habits, loss of appetite, bloody stools.   Resp: No shortness of breath with exertion or at rest.  No excess  mucus, no productive cough,  No non-productive cough,  No coughing up of blood.  No change in color of mucus.  No wheezing.  No chest wall deformity  Skin: no rash or lesions.  GU: no dysuria, change in color of urine, no urgency or frequency.  No flank pain, no hematuria   MS:  No joint pain or swelling.  No decreased range of motion.  No back pain.    Physical Exam  BP 138/78 (BP Location: Left Arm, Patient Position: Sitting, Cuff Size: Normal)   Pulse 75   Temp (!) 97.1 F (36.2 C) (Temporal)   Ht 5\' 6"  (1.676 m)   Wt 216 lb 12.8 oz (98.3 kg)   SpO2 95%   BMI 34.99 kg/m   GEN: A/Ox3; pleasant , NAD, well nourished    HEENT:  [place]/AT,  NOSE-clear, THROAT-clear, no lesions, no postnasal drip or exudate noted.   NECK:  Supple w/ fair ROM; no JVD; normal carotid impulses w/o bruits; no thyromegaly or nodules palpated; no lymphadenopathy.    RESP  Clear  P & A; w/o, wheezes/ rales/ or rhonchi. no accessory muscle use, no dullness to percussion  CARD:  RRR, no m/r/g, no peripheral edema, pulses intact, no cyanosis or clubbing.  GI:   Soft & nt; nml bowel sounds; no organomegaly or masses detected.   Musco: Warm bil, no deformities or joint swelling noted.   Neuro: alert, no focal deficits noted.    Skin: Warm, no lesions or rashes    Lab Results:   BNP No results found for: BNP  ProBNP No results found for: PROBNP  Imaging: DG Chest 2 View  Result Date: 01/13/2020 CLINICAL DATA:  Asthmatic bronchitis.  Acute exacerbation. EXAM: CHEST - 2 VIEW COMPARISON:  01/20/2016 FINDINGS: The heart size and mediastinal contours are within normal limits. Both lungs are clear. The visualized skeletal structures are unremarkable. IMPRESSION: Normal exam. Electronically Signed   By: Lorriane Shire M.D.   On: 01/13/2020 10:40      No flowsheet data found.  No results found for: NITRICOXIDE      Assessment & Plan:   Asthma Excellent control on present regimen  Continue  on trigger prevention   Plan  Patient Instructions  Continue on Symbiocrt 2 puffs Twice daily   , rinse after use.  Zyrtec 10mg  At bedtime .  ProAir As needed  Wheezing .  Activity as tolerated.  Chest xray today .  Follow up in office in 6 months with Dr. Valeta Harms with PFT and As needed         Allergic rhinitis Controlled on present regimen       Rexene Edison, NP 01/13/2020

## 2020-01-13 NOTE — Patient Instructions (Addendum)
Continue on Symbiocrt 2 puffs Twice daily   , rinse after use.  Zyrtec 10mg  At bedtime .  ProAir As needed  Wheezing .  Activity as tolerated.  Chest xray today .  Follow up in office in 6 months with Dr. Valeta Harms with PFT and As needed

## 2020-01-14 ENCOUNTER — Telehealth: Payer: Self-pay | Admitting: Adult Health

## 2020-01-14 ENCOUNTER — Ambulatory Visit: Payer: Medicare HMO | Attending: Internal Medicine

## 2020-01-14 DIAGNOSIS — Z23 Encounter for immunization: Secondary | ICD-10-CM

## 2020-01-14 NOTE — Progress Notes (Signed)
PCCM: Thanks. Happy to establish care.  [facility] 01/14/2020 9:32 PM

## 2020-01-14 NOTE — Telephone Encounter (Signed)
Pt's PFT was rescheduled to be done with her 6 month follow up.

## 2020-01-14 NOTE — Progress Notes (Signed)
   Covid-19 Vaccination Clinic  Name:  Carrie Perkins    MRN: [id number] DOB: [birth year]  01/14/2020  Carrie Perkins was observed post Covid-19 immunization for 15 minutes without incident. She was provided with Vaccine Information Sheet and instruction to access the V-Safe system.   Carrie Perkins was instructed to call 911 with any severe reactions post vaccine: Marland Kitchen Difficulty breathing  . Swelling of face and throat  . A fast heartbeat  . A bad rash all over body  . Dizziness and weakness   Immunizations Administered    Name Date Dose VIS Date Route   Pfizer COVID-19 Vaccine 01/14/2020  9:14 AM 0.3 mL 10/25/2019 Intramuscular   Manufacturer: [place]   Lot: [id number]   [place]: [id number]

## 2020-01-14 NOTE — Telephone Encounter (Signed)
Please call patient and reschedule her PFT per pt request

## 2020-04-16 ENCOUNTER — Encounter: Payer: Self-pay | Admitting: Family Medicine

## 2020-04-16 ENCOUNTER — Other Ambulatory Visit: Payer: Self-pay

## 2020-04-16 ENCOUNTER — Ambulatory Visit (INDEPENDENT_AMBULATORY_CARE_PROVIDER_SITE_OTHER): Payer: Medicare HMO | Admitting: Family Medicine

## 2020-04-16 VITALS — BP 120/78 | Temp 98.3°F | Ht 65.0 in | Wt 215.0 lb

## 2020-04-16 DIAGNOSIS — N760 Acute vaginitis: Secondary | ICD-10-CM

## 2020-04-16 NOTE — Progress Notes (Signed)
° °  Subjective:     Carrie Perkins is a 69 y.o. female presenting for Rash     HPI   #Vaginal itching - around the urethra - on the labia - no discharge - not currently sexually active - has not noticed a rash, but has not looked - has been using topical cream - for jock itch which helps - 3 weeks   Review of Systems  Genitourinary: Negative for dysuria, frequency, hematuria, urgency, vaginal bleeding and vaginal discharge.     Social History   Tobacco Use  Smoking Status Never Smoker  Smokeless Tobacco Never Used        Objective:    BP Readings from Last 3 Encounters:  04/16/20 120/78  01/13/20 138/78  10/03/19 (!) 146/94   Wt Readings from Last 3 Encounters:  04/16/20 215 lb (97.5 kg)  01/13/20 216 lb 12.8 oz (98.3 kg)  10/03/19 217 lb 8 oz (98.7 kg)    BP 120/78    Temp 98.3 F (36.8 C)    Ht 5\' 5"  (1.651 m)    Wt 215 lb (97.5 kg)    BMI 35.78 kg/m    Physical Exam Exam conducted with a chaperone present.  Constitutional:      General: She is not in acute distress.    Appearance: She is well-developed. She is not diaphoretic.  HENT:     Right Ear: External ear normal.     Left Ear: External ear normal.  Eyes:     Conjunctiva/sclera: Conjunctivae normal.  Cardiovascular:     Rate and Rhythm: Normal rate.  Pulmonary:     Effort: Pulmonary effort is normal.  Genitourinary:    Pubic Area: Rash (mild erythema of the labia and vaginal opening) present.  Musculoskeletal:     Cervical back: Neck supple.  Skin:    General: Skin is warm and dry.     Capillary Refill: Capillary refill takes less than 2 seconds.  Neurological:     Mental Status: She is alert. Mental status is at baseline.  Psychiatric:        Mood and Affect: Mood normal.        Behavior: Behavior normal.           Assessment & Plan:   Problem List Items Addressed This Visit    None    Visit Diagnoses    Acute vaginitis    -  Primary     Suspect yeast given improvement  with OTC cream, but no clear lesions Wet prep sent to r/o BV Will call with results and treat based on results    Return if symptoms worsen or fail to improve.  Lesleigh Noe, MD

## 2020-04-16 NOTE — Patient Instructions (Signed)
#  vaginitis - likely a yeast infection - will send out to make sure it is not Bacterial Vaginosis  - Results should be back tomorrow  Cancer Screening - There is no good test for Ovarian Cancer screening - Generally just monitor symptoms -- sometimes abdominal fullness - Could consider meeting with an OB/GYN to discuss if you want to see if there is any testing they would do

## 2020-04-17 ENCOUNTER — Other Ambulatory Visit: Payer: Self-pay | Admitting: Family Medicine

## 2020-04-17 DIAGNOSIS — B3731 Acute candidiasis of vulva and vagina: Secondary | ICD-10-CM

## 2020-04-17 LAB — WET PREP BY MOLECULAR PROBE
Candida species: DETECTED — AB
Gardnerella vaginalis: NOT DETECTED
MICRO NUMBER:: 10549304
SPECIMEN QUALITY:: ADEQUATE
Trichomonas vaginosis: NOT DETECTED

## 2020-04-17 MED ORDER — FLUCONAZOLE 150 MG PO TABS: 150.0000 mg | ORAL_TABLET | Freq: Once | ORAL | 0 refills | Status: AC

## 2020-07-18 ENCOUNTER — Other Ambulatory Visit (HOSPITAL_COMMUNITY): Payer: Medicare HMO

## 2020-07-23 ENCOUNTER — Ambulatory Visit: Payer: Medicare HMO | Admitting: Adult Health

## 2020-08-25 ENCOUNTER — Other Ambulatory Visit (HOSPITAL_COMMUNITY)
Admission: RE | Admit: 2020-08-25 | Discharge: 2020-08-25 | Disposition: A | Discharge status: Home / Self Care | Payer: Medicare HMO | Source: Ambulatory Visit | Attending: Adult Health | Admitting: Adult Health

## 2020-08-25 DIAGNOSIS — Z20822 Contact with and (suspected) exposure to covid-19: Secondary | ICD-10-CM | POA: Insufficient documentation

## 2020-08-25 DIAGNOSIS — Z01812 Encounter for preprocedural laboratory examination: Secondary | ICD-10-CM | POA: Diagnosis not present

## 2020-08-25 LAB — SARS CORONAVIRUS 2 (TAT 6-24 HRS): SARS Coronavirus 2: NEGATIVE

## 2020-08-27 ENCOUNTER — Other Ambulatory Visit: Payer: Self-pay | Admitting: Pulmonary Disease

## 2020-08-27 DIAGNOSIS — G4733 Obstructive sleep apnea (adult) (pediatric): Secondary | ICD-10-CM

## 2020-08-27 DIAGNOSIS — J4541 Moderate persistent asthma with (acute) exacerbation: Secondary | ICD-10-CM

## 2020-08-28 ENCOUNTER — Other Ambulatory Visit: Payer: Self-pay

## 2020-08-28 ENCOUNTER — Encounter: Payer: Self-pay | Admitting: Adult Health

## 2020-08-28 ENCOUNTER — Ambulatory Visit (INDEPENDENT_AMBULATORY_CARE_PROVIDER_SITE_OTHER): Payer: Medicare HMO | Admitting: Pulmonary Disease

## 2020-08-28 ENCOUNTER — Ambulatory Visit: Payer: Medicare HMO | Admitting: Adult Health

## 2020-08-28 DIAGNOSIS — J309 Allergic rhinitis, unspecified: Secondary | ICD-10-CM

## 2020-08-28 DIAGNOSIS — J4530 Mild persistent asthma, uncomplicated: Secondary | ICD-10-CM | POA: Diagnosis not present

## 2020-08-28 DIAGNOSIS — G4733 Obstructive sleep apnea (adult) (pediatric): Secondary | ICD-10-CM

## 2020-08-28 DIAGNOSIS — J4541 Moderate persistent asthma with (acute) exacerbation: Secondary | ICD-10-CM

## 2020-08-28 LAB — PULMONARY FUNCTION TEST
DL/VA % pred: 120 %
DL/VA: 4.96 ml/min/mmHg/L
DLCO cor % pred: 92 %
DLCO cor: 18.53 ml/min/mmHg
DLCO unc % pred: 92 %
DLCO unc: 18.53 ml/min/mmHg
FEF 25-75 Post: 2.37 L/sec
FEF 25-75 Pre: 2 L/sec
FEF2575-%Change-Post: 18 %
FEF2575-%Pred-Post: 124 %
FEF2575-%Pred-Pre: 104 %
FEV1-%Change-Post: 3 %
FEV1-%Pred-Post: 83 %
FEV1-%Pred-Pre: 80 %
FEV1-Post: 1.95 L
FEV1-Pre: 1.89 L
FEV1FVC-%Change-Post: 3 %
FEV1FVC-%Pred-Pre: 111 %
FEV6-%Change-Post: 0 %
FEV6-%Pred-Post: 75 %
FEV6-%Pred-Pre: 75 %
FEV6-Post: 2.24 L
FEV6-Pre: 2.24 L
FEV6FVC-%Pred-Post: 104 %
FEV6FVC-%Pred-Pre: 104 %
FVC-%Change-Post: 0 %
FVC-%Pred-Post: 72 %
FVC-%Pred-Pre: 72 %
FVC-Post: 2.24 L
FVC-Pre: 2.24 L
Post FEV1/FVC ratio: 87 %
Post FEV6/FVC ratio: 100 %
Pre FEV1/FVC ratio: 84 %
Pre FEV6/FVC Ratio: 100 %
RV % pred: 71 %
RV: 1.62 L
TLC % pred: 75 %
TLC: 3.91 L

## 2020-08-28 NOTE — Assessment & Plan Note (Signed)
Appears to be at baseline. Continue with trigger prevention  Plan  Patient Instructions  Continue on Symbiocrt 2 puffs Twice daily   , rinse after use.  Zyrtec 10mg  At bedtime .  ProAir As needed  Wheezing .  Activity as tolerated.  Follow up in office in 6 months with Dr. Valeta Harms or Violet Seabury and As needed

## 2020-08-28 NOTE — Assessment & Plan Note (Signed)
Continue on current regimen .   

## 2020-08-28 NOTE — Patient Instructions (Addendum)
Continue on Symbiocrt 2 puffs Twice daily   , rinse after use.  Zyrtec 10mg  At bedtime .  ProAir As needed  Wheezing .  Activity as tolerated.  Follow up in office in 6 months with Dr. Valeta Harms or Kendryck Lacroix and As needed

## 2020-08-28 NOTE — Progress Notes (Signed)
PFT done today. 

## 2020-08-28 NOTE — Progress Notes (Signed)
@Patient  ID: Carrie Perkins, female    DOB: 1951-09-20, 69 y.o.   MRN: [id number]  Chief Complaint  Patient presents with  . Follow-up    Asthma     Referring provider: Lesleigh Noe, MD  HPI: 69 year old female never smoker followed for asthma and allergic rhinitis Medical history significant for diastolic dysfunction Patient travels extensively.  TEST/EVENTS :  2D echo 2015 EF 78-29%,5 diastolic dysfunction  Chest x-ray January 13, 2020 showed clear lungs.   08/28/2020 Follow up : Asthma and AR  Patient returns for a 42-month follow-up.  Patient has underlying asthma and allergic rhinitis.  She is maintained on Symbicort.  Uses Zyrtec as needed.  She denies any increased albuterol use.  Says overall breathing has been doing okay.  She has not been as active as she was prior to the Covid pandemic.  She says she is planning on returning back to some increased activities when she comes back from her trip.. She denies any increased cough or wheezing.  Says that she does not use her Symbicort on a regular basis.  Says she felt like her breathing was doing well over all . No increased albuterol use.  Pulmonary function testing today shows mild to moderate restriction with no airflow obstruction.  FEV1 83%, ratio 87, no significant bronchodilator response, FVC 72%, DLCO 92%  Patient has an upcoming trip to [place] next month.  She is leaving on November 7.  This is her first trip since the Covid pandemic began.  She is getting her Covid booster today.  She says that she will be required to wear a N95 throughout her trip.     Allergies  Allergen Reactions  . Amoxicillin-Pot Clavulanate     REACTION: severe abd pain---intolerant  . Biaxin [Clarithromycin] Other (See Comments)    Caused severe stomach pain     Immunization History  Administered Date(s) Administered  . Influenza Split 12/12/2011  . Influenza Whole 08/14/2009  . PFIZER SARS-COV-2 Vaccination 12/20/2019, 01/14/2020  .  Tdap 11/14/2010    Past Medical History:  Diagnosis Date  . Allergic rhinitis, cause unspecified   . Obstructive sleep apnea (adult) (pediatric)   . Osteoarthrosis, unspecified whether generalized or localized, unspecified site   . Other and unspecified hyperlipidemia   . Other dyspnea and respiratory abnormality   . Overweight(278.02)   . Unspecified asthma(493.90)   . Unspecified urinary incontinence     Tobacco History: Social History   Tobacco Use  Smoking Status Never Smoker  Smokeless Tobacco Never Used   Counseling given: Not Answered   Outpatient Medications Prior to Visit  Medication Sig Dispense Refill  . albuterol (PROAIR HFA) 108 (90 Base) MCG/ACT inhaler Inhale 2 puffs into the lungs every 6 (six) hours as needed for wheezing or shortness of breath. 1 Inhaler 5  . aspirin EC 81 MG tablet Take 81 mg by mouth daily.    Marland Kitchen azelastine (ASTELIN) 0.1 % nasal spray Place 1 spray into both nostrils 2 (two) times daily. Use in each nostril as directed 90 mL 3  . Beclomethasone Dipropionate (QNASL) 80 MCG/ACT AERS Place 2 sprays into the nose at bedtime. 3 Inhaler 3  . budesonide-formoterol (SYMBICORT) 160-4.5 MCG/ACT inhaler Inhale 2 puffs into the lungs 2 (two) times daily. 2 Inhaler 0  . cetirizine (ZYRTEC) 10 MG chewable tablet Chew 10 mg by mouth 2 (two) times daily.     . Cholecalciferol 1000 UNITS capsule Take 1,000 Units by mouth daily.      Marland Kitchen  dextromethorphan-guaiFENesin (MUCINEX DM) 30-600 MG per 12 hr tablet Take 2 tablets by mouth every 12 (twelve) hours.     . NON FORMULARY doterra essential oil Mixes in water    . Red Yeast Rice 600 MG CAPS Take 1 capsule by mouth daily.    . Zinc Sulfate (ZINC 15 PO) Take by mouth. Not sure of the dose     No facility-administered medications prior to visit.     Review of Systems:   Constitutional:   No  weight loss, night sweats,  Fevers, chills, fatigue, or  lassitude.  HEENT:   No headaches,  Difficulty swallowing,   Tooth/dental problems, or  Sore throat,                No sneezing, itching, ear ache, nasal congestion, post nasal drip,   CV:  No chest pain,  Orthopnea, PND, swelling in lower extremities, anasarca, dizziness, palpitations, syncope.   GI  No heartburn, indigestion, abdominal pain, nausea, vomiting, diarrhea, change in bowel habits, loss of appetite, bloody stools.   Resp:  No chest wall deformity  Skin: no rash or lesions.  GU: no dysuria, change in color of urine, no urgency or frequency.  No flank pain, no hematuria   MS:  No joint pain or swelling.  No decreased range of motion.  No back pain.    Physical Exam  BP 130/70 (BP Location: Left Arm, Cuff Size: Normal)   Pulse 72   Ht 5\' 5"  (1.651 m)   Wt 215 lb 12.8 oz (97.9 kg)   SpO2 96%   BMI 35.91 kg/m   GEN: A/Ox3; pleasant , NAD, well nourished    HEENT:  [place]/AT,  NOSE-clear, THROAT-clear, no lesions, no postnasal drip or exudate noted.   NECK:  Supple w/ fair ROM; no JVD; normal carotid impulses w/o bruits; no thyromegaly or nodules palpated; no lymphadenopathy.    RESP  Clear  P & A; w/o, wheezes/ rales/ or rhonchi. no accessory muscle use, no dullness to percussion  CARD:  RRR, no m/r/g, no peripheral edema, pulses intact, no cyanosis or clubbing.  GI:   Soft & nt; nml bowel sounds; no organomegaly or masses detected.   Musco: Warm bil, no deformities or joint swelling noted.   Neuro: alert, no focal deficits noted.    Skin: Warm, no lesions or rashes    Lab Results:   BNP No results found for: BNP  ProBNP No results found for: PROBNP  Imaging: No results found.    PFT Results Latest Ref Rng & Units 08/28/2020  FVC-Pre L 2.24  FVC-Predicted Pre % 72  FVC-Post L 2.24  FVC-Predicted Post % 72  Pre FEV1/FVC % % 84  Post FEV1/FCV % % 87  FEV1-Pre L 1.89  FEV1-Predicted Pre % 80  FEV1-Post L 1.95  DLCO uncorrected ml/min/mmHg 18.53  DLCO UNC% % 92  DLCO corrected ml/min/mmHg 18.53  DLCO  COR %Predicted % 92  DLVA Predicted % 120  TLC L 3.91  TLC % Predicted % 75  RV % Predicted % 71    No results found for: NITRICOXIDE      Assessment & Plan:   Asthma Appears to be at baseline. Continue with trigger prevention  Plan  Patient Instructions  Continue on Symbiocrt 2 puffs Twice daily   , rinse after use.  Zyrtec 10mg  At bedtime .  ProAir As needed  Wheezing .  Activity as tolerated.  Follow up in office in 6 months with  Dr. Valeta Harms or Caulder Wehner and As needed         Allergic rhinitis Continue on current regimen     Rexene Edison, NP 08/28/2020

## 2020-09-04 NOTE — Progress Notes (Signed)
Thanks for seeing her.  Garner Nash, DO [facility] 09/04/2020 2:38 PM

## 2020-09-08 ENCOUNTER — Telehealth: Payer: Self-pay

## 2020-09-08 NOTE — Telephone Encounter (Signed)
Ashtyn CMA [facility] said pt called on [facility] line about dizziness and Ashtyn brought note to me to triage pt. I left v/m requesting pt to call [facility] using option 4 for triage. Pt already has in office appt scheduled with Dr Einar Pheasant on 09/10/20 at 9 AM.

## 2020-09-08 NOTE — Telephone Encounter (Signed)
She can be added on 09/09/2020 at noon

## 2020-09-08 NOTE — Telephone Encounter (Signed)
Called patient to schedule appointment 10/27. Pt declined as stated she will wait so she doesn't travel back and forth all day. Sending to provider as Juluis Rainier.

## 2020-09-08 NOTE — Telephone Encounter (Signed)
[facility] - Client TELEPHONE ADVICE RECORD [facility] Patient Name: Carrie Perkins Gender: Female DOB: 1951/09/22 Age: 69 Y 26 D Return Phone Number: [phone] (Primary) Address: City/State/Zip: [place] [address] Client [facility] - Client Client Site [facility] - Day Physician Waunita Schooner- MD Contact Type Call Who Is Calling Patient / Member / Family / Caregiver Call Type Triage / Clinical Relationship To Patient Self Return Phone Number [phone] (Primary) Chief Complaint Dizziness Reason for Call Symptomatic / Request for Health Information Initial Comment Transferred from answering service. PT has dizziness and being light headed. Translation No Nurse Assessment Nurse: Thad Ranger, RN, Denise Date/Time (Eastern Time): 09/08/2020 9:37:57 AM Confirm and document reason for call. If symptomatic, describe symptoms. ---PT has dizziness and being light headed intermittently. Denies these s/s now. States it happens most when she is bending over and rolling over in bed. Does the patient have any new or worsening symptoms? ---Yes Will a triage be completed? ---Yes Related visit to physician within the last 2 weeks? ---No Does the PT have any chronic conditions? (i.e. diabetes, asthma, this includes High risk factors for pregnancy, etc.) ---No Is this a behavioral health or substance abuse call? ---No Guidelines Guideline Title Affirmed Question Affirmed Notes Nurse Date/Time (Eastern Time) Dizziness - Lightheadedness [1] MODERATE dizziness (e.g., interferes with normal activities) AND [2] has NOT been evaluated by physician for this (Exception: dizziness caused by heat exposure, sudden standing, or poor fluid intake) Carmon, RN, Langley Gauss 09/08/2020 9:39:16 AM Disp. Time Eilene Ghazi Time) Disposition Final User 09/08/2020 9:42:34 AM See PCP within 24 Hours Yes Carmon, RN, Langley Gauss PLEASE NOTE:  All timestamps contained within this report are represented as [place] Standard Time. CONFIDENTIALTY NOTICE: This fax transmission is intended only for the addressee. It contains information that is legally privileged, confidential or otherwise protected from use or disclosure. If you are not the intended recipient, you are strictly prohibited from reviewing, disclosing, copying using or disseminating any of this information or taking any action in reliance on or regarding this information. If you have received this fax in error, please notify us immediately by telephone so that we can arrange for its return to [place]. Phone: [phone], Toll-Free: [phone], Fax: [phone] Page: 2 of 2 Call Id: [id number] [place] Disagree/Comply Comply Caller Understands Yes PreDisposition Call Doctor Care Advice Given Per Guideline SEE PCP WITHIN 24 HOURS: DRINK FLUIDS: * Drink several glasses of fruit juice, other clear fluids or water. * This will improve hydration and blood glucose. LIE DOWN AND REST: * Lie down with feet elevated for 1 hour. * This will improve circulation and increase blood flow to the brain. CALL BACK IF: * Passes out (faints) * You become worse CARE ADVICE given per Dizziness (Adult) guideline. Referrals REFERRED TO PCP OFFICE

## 2020-09-08 NOTE — Telephone Encounter (Signed)
Noted will see her 10/28

## 2020-09-08 NOTE — Telephone Encounter (Signed)
I spoke with pt and pt has H/A on and off; pt has lightheadedness and the room does not spend around for about 1 wk. Pt has family hx of stroke.pt had PCR test about 1 wk ago and pt wears a mask daily for 8 hrs; pt has increased her fluids. Pt has appt with DR Einar Pheasant on 09/10/20 at 9 AM and pt is traveling to [place] in 2 wks by plane and would like to be seen ASAP. Pt prefers to see Dr Einar Pheasant and pt does not want to go UC or ED. Pt wants to know if Dr Einar Pheasant could work pt in sooner than 09/10/20. UC precautions given and [facility] on [address] phone # [phone] given to pt to see if could schedule appt at [facility] on [facility] so would not have long wait time. Pt request cb after Dr Einar Pheasant reviews this note.

## 2020-09-10 ENCOUNTER — Other Ambulatory Visit: Payer: Self-pay

## 2020-09-10 ENCOUNTER — Encounter: Payer: Self-pay | Admitting: Family Medicine

## 2020-09-10 ENCOUNTER — Ambulatory Visit (INDEPENDENT_AMBULATORY_CARE_PROVIDER_SITE_OTHER): Payer: Medicare HMO | Admitting: Family Medicine

## 2020-09-10 VITALS — BP 120/72 | HR 80 | Temp 96.9°F | Wt 217.0 lb

## 2020-09-10 DIAGNOSIS — H8110 Benign paroxysmal vertigo, unspecified ear: Secondary | ICD-10-CM | POA: Diagnosis not present

## 2020-09-10 DIAGNOSIS — K219 Gastro-esophageal reflux disease without esophagitis: Secondary | ICD-10-CM

## 2020-09-10 DIAGNOSIS — J4530 Mild persistent asthma, uncomplicated: Secondary | ICD-10-CM | POA: Diagnosis not present

## 2020-09-10 DIAGNOSIS — J309 Allergic rhinitis, unspecified: Secondary | ICD-10-CM | POA: Diagnosis not present

## 2020-09-10 MED ORDER — MECLIZINE HCL 25 MG PO TABS: 25.0000 mg | ORAL_TABLET | Freq: Three times a day (TID) | ORAL | 0 refills | Status: DC | PRN

## 2020-09-10 NOTE — Progress Notes (Signed)
Subjective:     Carrie Perkins is a 69 y.o. female presenting for Dizziness (x 1 week when turning in bed or bending over ) and Gas (burping even when drinking water )     HPI  #Dizziness - with turning over in bed - will feel lightheaded  - will have to close her eyes  - improves with closing the eyes - occurs more at night when she rolls over in bed - also got symptoms when she moved her head up/down quickly - also had some ligthheadedness this morning when she got out of the car and walked in  #Belching - does not consume carbonated beverages regularly - or drink through a straw  Review of Systems   Social History   Tobacco Use  Smoking Status Never Smoker  Smokeless Tobacco Never Used        Objective:    BP Readings from Last 3 Encounters:  09/10/20 120/72  08/28/20 130/70  04/16/20 120/78   Wt Readings from Last 3 Encounters:  09/10/20 217 lb (98.4 kg)  08/28/20 215 lb 12.8 oz (97.9 kg)  04/16/20 215 lb (97.5 kg)    BP 120/72   Pulse 80   Temp (!) 96.9 F (36.1 C) (Temporal)   Wt 217 lb (98.4 kg)   SpO2 98%   BMI 36.11 kg/m    Physical Exam Constitutional:      General: She is not in acute distress.    Appearance: She is well-developed. She is not diaphoretic.  HENT:     Right Ear: Tympanic membrane, ear canal and external ear normal.     Left Ear: Tympanic membrane, ear canal and external ear normal.  Eyes:     General: No scleral icterus.    Extraocular Movements: Extraocular movements intact.     Conjunctiva/sclera: Conjunctivae normal.     Pupils: Pupils are equal, round, and reactive to light.     Comments: No nystagmus  Cardiovascular:     Rate and Rhythm: Normal rate and regular rhythm.     Heart sounds: No murmur heard.   Pulmonary:     Effort: Pulmonary effort is normal. No respiratory distress.     Breath sounds: Normal breath sounds. No wheezing.  Musculoskeletal:     Cervical back: Neck supple.  Skin:    General: Skin  is warm and dry.     Capillary Refill: Capillary refill takes less than 2 seconds.  Neurological:     Mental Status: She is alert. Mental status is at baseline.  Psychiatric:        Mood and Affect: Mood normal.        Behavior: Behavior normal.           Assessment & Plan:   Problem List Items Addressed This Visit      Respiratory   Allergic rhinitis    Cont daily allergy treatment. With HA likely related to allergies.       Asthma    Stable. Recent PFTs with pulmonology. Cont Symbicort 160-4.5. prn albuterol        Digestive   GASTROESOPHAGEAL REFLUX DISEASE    With belching. Advised against straw drinking, carbonation, gum chewing, and quick drinking. Increase hydration. Trial of omeprazole and prn tums.       Relevant Medications   meclizine (ANTIVERT) 25 MG tablet     Nervous and Auditory   Benign paroxysmal positional vertigo - Primary    Normal exam, but hx most consistent with vertigo.  Meclizine prn prescribed. And advised doing home exercises. She will reach out if not improved and will do PT referral.       Relevant Medications   meclizine (ANTIVERT) 25 MG tablet       Return if symptoms worsen or fail to improve.  Lesleigh Noe, MD  This visit occurred during the SARS-CoV-2 public health emergency.  Safety protocols were in place, including screening questions prior to the visit, additional usage of staff PPE, and extensive cleaning of exam room while observing appropriate contact time as indicated for disinfecting solutions.

## 2020-09-10 NOTE — Patient Instructions (Addendum)
Vertigo - try the brandt daroff vertigo exercises - Meclizine if not improved (also a motion sickness pill)  - call or mychart when you get back if not improved   Heartburn medications - getting Tums - Prilosec (omeprazole)

## 2020-09-10 NOTE — Assessment & Plan Note (Signed)
Stable. Recent PFTs with pulmonology. Cont Symbicort 160-4.5. prn albuterol

## 2020-09-10 NOTE — Assessment & Plan Note (Signed)
With belching. Advised against straw drinking, carbonation, gum chewing, and quick drinking. Increase hydration. Trial of omeprazole and prn tums.

## 2020-09-10 NOTE — Assessment & Plan Note (Signed)
Normal exam, but hx most consistent with vertigo. Meclizine prn prescribed. And advised doing home exercises. She will reach out if not improved and will do PT referral.

## 2020-09-10 NOTE — Assessment & Plan Note (Signed)
Cont daily allergy treatment. With HA likely related to allergies.

## 2020-12-16 ENCOUNTER — Telehealth: Payer: Self-pay | Admitting: Adult Health

## 2020-12-16 NOTE — Telephone Encounter (Signed)
Called and spoke with patient, she states she took a home covid test and it was positive.  She states she is experiencing a headache, minimal cough and sore throat.  Patient wanted to know what to do.  Advised to get plenty of rest, drink plenty of fluids and to call the office back if she begins to have sob, nausea, vomiting, diarrhea, unable to eat/drink or the cough becomes worse and is not controlled by over the counter cough medication.  She verbalized understanding.  Nothing further needed.

## 2020-12-17 ENCOUNTER — Telehealth: Payer: Self-pay

## 2020-12-17 NOTE — Telephone Encounter (Signed)
Symptoms started 12-13-20 with sore throat and headache. Now has nasal congestion, slight chest congestion. Taking Zyrtec, Mucinex regular in the am and Mucinex DM at night, Zinc, and Vitamin D. Tested Positive yesterday. Vaccinated and boosted. Asking if there is anything else she should be doing. Please advise at [phone].

## 2020-12-17 NOTE — Telephone Encounter (Signed)
Can refer for some of the outpatient treatment options but not sure if she would make the list due to a shortage.

## 2020-12-21 ENCOUNTER — Telehealth: Payer: Self-pay | Admitting: Adult Health

## 2020-12-21 MED ORDER — BUDESONIDE-FORMOTEROL FUMARATE 160-4.5 MCG/ACT IN AERO: 2.0000 | INHALATION_SPRAY | Freq: Two times a day (BID) | RESPIRATORY_TRACT | 5 refills | Status: DC

## 2020-12-21 MED ORDER — AZITHROMYCIN 250 MG PO TABS: ORAL_TABLET | ORAL | 0 refills | Status: AC

## 2020-12-21 MED ORDER — AZELASTINE HCL 0.1 % NA SOLN: 1.0000 | Freq: Two times a day (BID) | NASAL | 1 refills | Status: DC

## 2020-12-21 MED ORDER — QNASL 80 MCG/ACT NA AERS: 2.0000 | INHALATION_SPRAY | Freq: Every day | NASAL | 2 refills | Status: DC

## 2020-12-21 MED ORDER — PREDNISONE 20 MG PO TABS: 20.0000 mg | ORAL_TABLET | Freq: Every day | ORAL | 0 refills | Status: DC

## 2020-12-21 NOTE — Telephone Encounter (Signed)
Sorry to hear that she tested positive.  Day 9 of symptoms.  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  If she is not allergic and is able to take a Z-Pak can call in Z-Pak #1 take as directed (does have an allergy to Biaxin which was more stomach distress.)   Recommend to continue her current asthma maintenance regimen.  May use Mucinex DM twice daily as needed for cough and congestion. As far as returning to work she can remain out of work for the next 3 days until feeling better.  Call back if not improving will need in person office visit. She can have work note  Please contact office for sooner follow up if symptoms do not improve or worsen or seek emergency care

## 2020-12-21 NOTE — Telephone Encounter (Signed)
Spoke to patient and relayed below recommendations.  Patient stated that she has taken zpak previously without any issues. Rx has been sent to preferred pharmacy.  She is questioning if TP thinks she needs a steroid with the antibiotic. She does not need a letter for work.  TP, please advise on steroid. Thanks

## 2020-12-21 NOTE — Telephone Encounter (Signed)
Patient is aware of below message and voiced her understanding.  Rx for prednisone has been sent to preferred pharmacy. Nothing further needed.  

## 2020-12-21 NOTE — Telephone Encounter (Signed)
Can have prednisone 20mg  daily for 5 days , no refills   Please contact office for sooner follow up if symptoms do not improve or worsen or seek emergency care

## 2020-12-21 NOTE — Telephone Encounter (Signed)
pt calling because she recently tested positive for COVID. was told to call back if symptoms worsens . pt is currently experiecning a wet cough & is coughing up yellow phlegm, headache has gotten worse. Pt also  needs new scription for azelastin, astepro &  Symbicort sent to [facility] on [address]. Pt also express concerns about returning to work because she's aware she could test positive after having it,pt needs negative test to return to work.   please advise [phone]

## 2020-12-21 NOTE — Telephone Encounter (Signed)
Primary Pulmonologist: BI  Last office visit and with whom: 08/28/20 with TP What do we see them for (pulmonary problems): asthma  Last OV assessment/plan:   Was appointment offered to patient (explain)?  Patient wanted recommendations    Reason for call: Patient stated that she originally tested positive at home on 12/13/20. She went to a lab in [place] for a PCR test and that test came back normal a few days later. She currently has a productive cough with yellow phlegm. Denied any increased SOB. She has been using her Symbicort, Astelin and Qnasl sprays, which she needed refills on. I have sent these to the pharmacy for her.  She also has a a dull headache that is right in the middle of her forehead, in between her eyes.   Denied any fevers or body aches. Slight increase in fatigue.   She also had questions about when to return to work. She was told that she could return back to work 5 days after her positive test but she is still symptomatic and wonders if she needs to remain out of work for a few more days.   (examples of things to ask: : When did symptoms start? Fever? Cough? Productive? Color to sputum? More sputum than usual? Wheezing? Have you needed increased oxygen? Are you taking your respiratory medications? What over the counter measures have you tried?)  Allergies  Allergen Reactions  . Amoxicillin-Pot Clavulanate     REACTION: severe abd pain---intolerant  . Biaxin [Clarithromycin] Other (See Comments)    Caused severe stomach pain     Immunization History  Administered Date(s) Administered  . Influenza Split 12/12/2011  . Influenza Whole 08/14/2009  . PFIZER(Purple Top)SARS-COV-2 Vaccination 12/20/2019, 01/14/2020  . Tdap 11/14/2010   Pharmacy is [facility] on [address] [place], can you please advise? Thanks!

## 2020-12-25 NOTE — Telephone Encounter (Signed)
Pt has made contact with her pulmonologist who prescribed a z-pack and prednisone.

## 2020-12-31 ENCOUNTER — Telehealth: Payer: Self-pay | Admitting: Pulmonary Disease

## 2020-12-31 NOTE — Telephone Encounter (Signed)
Error

## 2021-02-16 DIAGNOSIS — H2513 Age-related nuclear cataract, bilateral: Secondary | ICD-10-CM | POA: Diagnosis not present

## 2021-03-04 ENCOUNTER — Ambulatory Visit: Payer: Medicare HMO | Admitting: Adult Health

## 2021-03-12 ENCOUNTER — Ambulatory Visit (INDEPENDENT_AMBULATORY_CARE_PROVIDER_SITE_OTHER): Payer: Medicare HMO | Admitting: Adult Health

## 2021-03-12 ENCOUNTER — Encounter: Payer: Self-pay | Admitting: Adult Health

## 2021-03-12 ENCOUNTER — Other Ambulatory Visit: Payer: Self-pay

## 2021-03-12 DIAGNOSIS — J302 Other seasonal allergic rhinitis: Secondary | ICD-10-CM | POA: Diagnosis not present

## 2021-03-12 DIAGNOSIS — H539 Unspecified visual disturbance: Secondary | ICD-10-CM | POA: Diagnosis not present

## 2021-03-12 DIAGNOSIS — J4530 Mild persistent asthma, uncomplicated: Secondary | ICD-10-CM | POA: Diagnosis not present

## 2021-03-12 NOTE — Progress Notes (Signed)
@Patient  ID: Carrie Perkins, female    DOB: [birth year], 70 y.o.   MRN: [id number]  Chief Complaint  Patient presents with  . Follow-up    Referring provider: Lesleigh Noe, MD  HPI: 70 year old female never smoker followed for asthma and allergic rhinitis Medical history significant for diastolic dysfunction Patient travels extensively [place]> she had sleep study 2008 w/ AHI=22 & desat tp 80%; she declined CPAP  TEST/EVENTS :  2D echo 2015 EF 86-76%,1 diastolic dysfunction  Chest x-ray January 13, 2020 showed clear lungs.  PFTs August 28, 2020 showed FEV1 at 83%, ratio 87, FVC 72%, no significant bronchodilator response, DLCO 92%.  03/12/2021 Follow up : Asthma and AR  Patient returns for a 30-month follow-up.  Patient has asthma and allergic rhinitis.  She remains on Symbicort twice daily.  .  Says overall breathing is doing okay.  Denies any increased albuterol use.  No flare of cough or wheezing.  No recent emergency room or hospitalizations due to her asthma.  Pulmonary function testing in the fall 2021 showed normal lung function.   Patient does complain that her insurance is not covering Symbicort well. Says her allergies have been under good control.  She does use Zyrtec, Astelin, Qnasl as needed.  Had covid early February took about 10 days to recover, felt like a bad cold. Was called in [place] and Prednisone . Felt it really helps. Totally resolution of symptoms with no known sequale No cogh or congestion .  Remains active, works fulltime at the [employer]. Does not exercise. Very social and travels. Does light chores at home.   Had episode couple weeks ago that she had a black spot develop in her right lasted for few seconds and then resolved.  Had no speech changes no facial droop no extremity weakness no syncope or palpitations.  Had no chest pain.   Allergies  Allergen Reactions  . Amoxicillin-Pot Clavulanate     REACTION: severe abd pain---intolerant  . Biaxin [Clarithromycin]  Other (See Comments)    Caused severe stomach pain     Immunization History  Administered Date(s) Administered  . Influenza Split 12/12/2011  . Influenza Whole 08/14/2009  . PFIZER(Purple Top)SARS-COV-2 Vaccination 12/20/2019, 01/14/2020  . Tdap 11/14/2010    Past Medical History:  Diagnosis Date  . Allergic rhinitis, cause unspecified   . Obstructive sleep apnea (adult) (pediatric)   . Osteoarthrosis, unspecified whether generalized or localized, unspecified site   . Other and unspecified hyperlipidemia   . Other dyspnea and respiratory abnormality   . Overweight(278.02)   . Unspecified asthma(493.90)   . Unspecified urinary incontinence     Tobacco History: Social History   Tobacco Use  Smoking Status Never Smoker  Smokeless Tobacco Never Used   Counseling given: Not Answered   Outpatient Medications Prior to Visit  Medication Sig Dispense Refill  . albuterol (PROAIR HFA) 108 (90 Base) MCG/ACT inhaler Inhale 2 puffs into the lungs every 6 (six) hours as needed for wheezing or shortness of breath. 1 Inhaler 5  . aspirin EC 81 MG tablet Take 81 mg by mouth daily.    Marland Kitchen azelastine (ASTELIN) 0.1 % nasal spray Place 1 spray into both nostrils 2 (two) times daily. Use in each nostril as directed 90 mL 1  . Beclomethasone Dipropionate (QNASL) 80 MCG/ACT AERS Place 2 sprays into the nose at bedtime. 3 each 2  . budesonide-formoterol (SYMBICORT) 160-4.5 MCG/ACT inhaler Inhale 2 puffs into the lungs 2 (two) times daily. 10.2 g 5  .  cetirizine (ZYRTEC) 10 MG chewable tablet Chew 10 mg by mouth 2 (two) times daily.    . Cholecalciferol 1000 UNITS capsule Take 1,000 Units by mouth daily.    Marland Kitchen dextromethorphan-guaiFENesin (MUCINEX DM) 30-600 MG per 12 hr tablet Take 2 tablets by mouth every 12 (twelve) hours.    . meclizine (ANTIVERT) 25 MG tablet Take 1 tablet (25 mg total) by mouth 3 (three) times daily as needed for dizziness. 60 tablet 0  . NON FORMULARY doterra essential  oil Mixes in water    . predniSONE (DELTASONE) 20 MG tablet Take 1 tablet (20 mg total) by mouth daily with breakfast. 5 tablet 0  . Red Yeast Rice 600 MG CAPS Take 1 capsule by mouth daily.    . Zinc Sulfate (ZINC 15 PO) Take by mouth. Not sure of the dose     No facility-administered medications prior to visit.     Review of Systems:   Constitutional:   No  weight loss, night sweats,  Fevers, chills,  +fatigue, or  lassitude.  HEENT:   No headaches,  Difficulty swallowing,  Tooth/dental problems, or  Sore throat,                No sneezing, itching, ear ache, nasal congestion, post nasal drip,   CV:  No chest pain,  Orthopnea, PND, swelling in lower extremities, anasarca, dizziness, palpitations, syncope.   GI  No heartburn, indigestion, abdominal pain, nausea, vomiting, diarrhea, change in bowel habits, loss of appetite, bloody stools.   Resp:   No chest wall deformity  Skin: no rash or lesions.  GU: no dysuria, change in color of urine, no urgency or frequency.  No flank pain, no hematuria   MS:  No joint pain or swelling.  No decreased range of motion.  No back pain.    Physical Exam  BP 118/72   Pulse 82   Ht 5\' 5"  (1.651 m)   Wt 214 lb (97.1 kg)   SpO2 99%   BMI 35.61 kg/m   GEN: A/Ox3; pleasant , NAD, well nourished    HEENT:  [place]/AT,  EACs-clear, TMs-wnl, NOSE-clear, THROAT-clear, no lesions, no postnasal drip or exudate noted.  Facial features are symmetrical.  Pupils are reactive and symmetrical  NECK:  Supple w/ fair ROM; no JVD; normal carotid impulses w/o bruits; no thyromegaly or nodules palpated; no lymphadenopathy.    RESP  Clear  P & A; w/o, wheezes/ rales/ or rhonchi. no accessory muscle use, no dullness to percussion  CARD:  RRR, no m/r/g, no peripheral edema, pulses intact, no cyanosis or clubbing.  GI:   Soft & nt; nml bowel sounds; no organomegaly or masses detected.   Musco: Warm bil, no deformities or joint swelling noted.   Neuro:  alert, no focal deficits noted.    Skin: Warm, no lesions or rashes    Lab Results:   BNP No results found for: BNP  ProBNP No results found for: PROBNP  Imaging: No results found.    PFT Results Latest Ref Rng & Units 08/28/2020  FVC-Pre L 2.24  FVC-Predicted Pre % 72  FVC-Post L 2.24  FVC-Predicted Post % 72  Pre FEV1/FVC % % 84  Post FEV1/FCV % % 87  FEV1-Pre L 1.89  FEV1-Predicted Pre % 80  FEV1-Post L 1.95  DLCO uncorrected ml/min/mmHg 18.53  DLCO UNC% % 92  DLCO corrected ml/min/mmHg 18.53  DLCO COR %Predicted % 92  DLVA Predicted % 120  TLC L 3.91  TLC % Predicted % 75  RV % Predicted % 71    No results found for: NITRICOXIDE      Assessment & Plan:   Asthma Well-controlled on current regimen no changes She will check with her insurance formulary to see if we have an alternative for Symbicort  Plan Patient Instructions  Continue on Symbiocrt 2 puffs Twice daily   , rinse after use.  Zyrtec 10mg  At bedtime As needed   Astelin and QNASAL nasal spray As needed   ProAir As needed  Wheezing .  Activity as tolerated.  Call for eye appointment.  Follow up in office in 6 months with Dr. Valeta Harms or Letetia Romanello and As needed         Allergic rhinitis Controlled on current regimen  Visual changes Episode of visual change with a black spot that lasted briefly few weeks ago..  Questionable etiology.  Of asked her to make an appointment with her ophthalmologist. Exam was unrevealing.     Rexene Edison, NP 03/12/2021

## 2021-03-12 NOTE — Patient Instructions (Addendum)
Continue on Symbiocrt 2 puffs Twice daily   , rinse after use.  Zyrtec 10mg  At bedtime As needed   Astelin and QNASAL nasal spray As needed   ProAir As needed  Wheezing .  Activity as tolerated.  Call for eye appointment.  Follow up in office in 6 months with Dr. Valeta Harms or Aziz Slape and As needed

## 2021-03-12 NOTE — Progress Notes (Signed)
PCCM: Thanks for seeing her Darvin [facility] 03/12/2021 2:27 PM

## 2021-03-12 NOTE — Assessment & Plan Note (Signed)
Episode of visual change with a black spot that lasted briefly few weeks ago..  Questionable etiology.  Of asked her to make an appointment with her ophthalmologist. Exam was unrevealing.

## 2021-03-12 NOTE — Assessment & Plan Note (Signed)
Well-controlled on current regimen no changes She will check with her insurance formulary to see if we have an alternative for Symbicort  Plan Patient Instructions  Continue on Symbiocrt 2 puffs Twice daily   , rinse after use.  Zyrtec 10mg  At bedtime As needed   Astelin and QNASAL nasal spray As needed   ProAir As needed  Wheezing .  Activity as tolerated.  Call for eye appointment.  Follow up in office in 6 months with Dr. Valeta Harms or Lolita Faulds and As needed

## 2021-03-12 NOTE — Assessment & Plan Note (Signed)
Controlled on current regimen.   

## 2021-03-17 ENCOUNTER — Other Ambulatory Visit: Payer: Self-pay | Admitting: Family Medicine

## 2021-03-17 DIAGNOSIS — Z1231 Encounter for screening mammogram for malignant neoplasm of breast: Secondary | ICD-10-CM

## 2021-03-20 ENCOUNTER — Ambulatory Visit
Admission: RE | Admit: 2021-03-20 | Discharge: 2021-03-20 | Disposition: A | Discharge status: Home / Self Care | Payer: Medicare HMO | Source: Ambulatory Visit | Attending: Family Medicine | Admitting: Family Medicine

## 2021-03-20 DIAGNOSIS — Z1231 Encounter for screening mammogram for malignant neoplasm of breast: Secondary | ICD-10-CM | POA: Diagnosis not present

## 2021-04-05 ENCOUNTER — Telehealth: Payer: Self-pay | Admitting: Family Medicine

## 2021-04-05 NOTE — Chronic Care Management (AMB) (Signed)
  Chronic Care Management   Outreach Note  04/05/2021 Name: Carrie Perkins MRN: [id number] DOB: 10/13/1951  Referred by: Lesleigh Noe, MD Reason for referral : No chief complaint on file.   An unsuccessful telephone outreach was attempted today. The patient was referred to the pharmacist for assistance with care management and care coordination.   Follow Up Plan:   Tatjana Dellinger [employer] scheduler

## 2021-04-21 ENCOUNTER — Telehealth: Payer: Self-pay | Admitting: Family Medicine

## 2021-04-21 NOTE — Chronic Care Management (AMB) (Signed)
  Chronic Care Management   Outreach Note  04/21/2021 Name: Carrie Perkins MRN: [id number] DOB: 08-Aug-1951  Referred by: Lesleigh Noe, MD Reason for referral : No chief complaint on file.   A second unsuccessful telephone outreach was attempted today. The patient was referred to pharmacist for assistance with care management and care coordination.  Follow Up Plan:  Tatjana Dellinger [employer] Scheduler

## 2021-06-02 ENCOUNTER — Telehealth: Payer: Self-pay | Admitting: Adult Health

## 2021-06-02 ENCOUNTER — Ambulatory Visit: Payer: Medicare HMO | Admitting: Acute Care

## 2021-06-02 MED ORDER — AZITHROMYCIN 250 MG PO TABS: ORAL_TABLET | ORAL | 0 refills | Status: DC

## 2021-06-02 MED ORDER — PREDNISONE 10 MG PO TABS: ORAL_TABLET | ORAL | 0 refills | Status: DC

## 2021-06-02 NOTE — Progress Notes (Deleted)
History of Present Illness Carrie Perkins is a 70 y.o. female never smoker with asthma and allergic rhinitis. She is followed by Dr. Valeta Perkins.   Synopsis 70 year old female never smoker followed for asthma and allergic rhinitis Medical history significant for diastolic dysfunction Patient [place]> she had sleep study 2008 w/ AHI=22 & desat tp 80%; she declined CPAP     06/02/2021 Acute Visit Pt. Presents for an acute OV. Pt c/o prod cough with yellow mucus (this started as clear and today has turned yellow), chest congestion and fatigue x 5 days. Pt denies f/c/s, CP/tightness, SOB, wheezing. Pt states she tested for covid on 7/17 and it was negative. Pt has been vaccinated and boosted. I have asked that the patient re-test prior to coming into the office, as one negative test with continued symptoms does not definitively mean the patient does not have Covid. .   TEST/EVENTS :  2D echo 2015 EF 28-41%, 2 diastolic dysfunction   Chest x-ray January 13, 2020 showed clear lungs.   PFTs August 28, 2020 showed FEV1 at 83%, ratio 87, FVC 72%, no significant bronchodilator response, DLCO 92%.  CBC Latest Ref Rng & Units 01/20/2016 01/08/2014 01/09/2013  WBC 4.0 - 10.5 K/uL 6.5 7.5 6.4  Hemoglobin 12.0 - 15.0 g/dL 14.8 14.6 14.3  Hematocrit 36.0 - 46.0 % 42.2 43.6 41.2  Platelets 150.0 - 400.0 K/uL 284.0 316.0 301.0    BMP Latest Ref Rng & Units 01/20/2016 01/08/2014 01/09/2013  Glucose 70 - 99 mg/dL 131(H) 114(H) 119(H)  BUN 6 - 23 mg/dL 12 12 12   Creatinine 0.40 - 1.20 mg/dL 0.62 0.6 0.6  Sodium 135 - 145 mEq/L 139 139 138  Potassium 3.5 - 5.1 mEq/L 4.0 4.0 4.0  Chloride 96 - 112 mEq/L 102 105 104  CO2 19 - 32 mEq/L 28 29 27   Calcium 8.4 - 10.5 mg/dL 9.5 9.3 9.4    BNP No results found for: BNP  ProBNP No results found for: PROBNP  PFT    Component Value Date/Time   FEV1PRE 1.89 08/28/2020 0900   FEV1POST 1.95 08/28/2020 0900   FVCPRE 2.24 08/28/2020 0900   FVCPOST 2.24  08/28/2020 0900   TLC 3.91 08/28/2020 0900   DLCOUNC 18.53 08/28/2020 0900   PREFEV1FVCRT 84 08/28/2020 0900   PSTFEV1FVCRT 87 08/28/2020 0900    No results found.   Past medical hx Past Medical History:  Diagnosis Date   Allergic rhinitis, cause unspecified    Obstructive sleep apnea (adult) (pediatric)    Osteoarthrosis, unspecified whether generalized or localized, unspecified site    Other and unspecified hyperlipidemia    Other dyspnea and respiratory abnormality    Overweight(278.02)    Unspecified asthma(493.90)    Unspecified urinary incontinence      Social History   Tobacco Use   Smoking status: Never   Smokeless tobacco: Never  Vaping Use   Vaping Use: Never used  Substance Use Topics   Alcohol use: Yes    Alcohol/week: 0.0 standard drinks    Comment: rare-socially   Drug use: No    Carrie Perkins reports that she has never smoked. She has never used smokeless tobacco. She reports current alcohol use. She reports that she does not use drugs.  Tobacco Cessation: Counseling given: Not Answered   Past surgical hx, Family hx, Social hx all reviewed.  Current Outpatient Medications on File Prior to Visit  Medication Sig   albuterol (PROAIR HFA) 108 (90 Base) MCG/ACT inhaler Inhale 2 puffs into  the lungs every 6 (six) hours as needed for wheezing or shortness of breath.   aspirin EC 81 MG tablet Take 81 mg by mouth daily.   azelastine (ASTELIN) 0.1 % nasal spray Place 1 spray into both nostrils 2 (two) times daily. Use in each nostril as directed   Beclomethasone Dipropionate (QNASL) 80 MCG/ACT AERS Place 2 sprays into the nose at bedtime.   budesonide-formoterol (SYMBICORT) 160-4.5 MCG/ACT inhaler Inhale 2 puffs into the lungs 2 (two) times daily.   cetirizine (ZYRTEC) 10 MG chewable tablet Chew 10 mg by mouth 2 (two) times daily.   Cholecalciferol 1000 UNITS capsule Take 1,000 Units by mouth daily.   dextromethorphan-guaiFENesin (MUCINEX DM) 30-600 MG per 12 hr  tablet Take 2 tablets by mouth every 12 (twelve) hours.   meclizine (ANTIVERT) 25 MG tablet Take 1 tablet (25 mg total) by mouth 3 (three) times daily as needed for dizziness.   NON FORMULARY doterra essential oil Mixes in water   predniSONE (DELTASONE) 20 MG tablet Take 1 tablet (20 mg total) by mouth daily with breakfast.   Red Yeast Rice 600 MG CAPS Take 1 capsule by mouth daily.   Zinc Sulfate (ZINC 15 PO) Take by mouth. Not sure of the dose   No current facility-administered medications on file prior to visit.     Allergies  Allergen Reactions   Amoxicillin-Pot Clavulanate     REACTION: severe abd pain---intolerant   Biaxin [Clarithromycin] Other (See Comments)    Caused severe stomach pain     Review Of Systems:  Constitutional:   No  weight loss, night sweats,  Fevers, chills, fatigue, or  lassitude.  HEENT:   No headaches,  Difficulty swallowing,  Tooth/dental problems, or  Sore throat,                No sneezing, itching, ear ache, nasal congestion, post nasal drip,   CV:  No chest pain,  Orthopnea, PND, swelling in lower extremities, anasarca, dizziness, palpitations, syncope.   GI  No heartburn, indigestion, abdominal pain, nausea, vomiting, diarrhea, change in bowel habits, loss of appetite, bloody stools.   Resp: No shortness of breath with exertion or at rest.  No excess mucus, no productive cough,  No non-productive cough,  No coughing up of blood.  No change in color of mucus.  No wheezing.  No chest wall deformity  Skin: no rash or lesions.  GU: no dysuria, change in color of urine, no urgency or frequency.  No flank pain, no hematuria   MS:  No joint pain or swelling.  No decreased range of motion.  No back pain.  Psych:  No change in mood or affect. No depression or anxiety.  No memory loss.   Vital Signs There were no vitals taken for this visit.   Physical Exam:  General- No distress,  A&Ox3 ENT: No sinus tenderness, TM clear, pale nasal mucosa, no  oral exudate,no post nasal drip, no LAN Cardiac: S1, S2, regular rate and rhythm, no murmur Chest: No wheeze/ rales/ dullness; no accessory muscle use, no nasal flaring, no sternal retractions Abd.: Soft Non-tender Ext: No clubbing cyanosis, edema Neuro:  normal strength Skin: No rashes, warm and dry Psych: normal mood and behavior   Assessment/Plan  No problem-specific Assessment & Plan notes found for this encounter.    Magdalen Spatz, NP 06/02/2021  1:39 PM

## 2021-06-02 NOTE — Telephone Encounter (Signed)
Called and spoke with patient. I was about to get her scheduled to see TP on 07/01/21 at 330pm.   Zpak and prednisone [facility] into [facility] [address].   Nothing further needed at time of call.

## 2021-06-02 NOTE — Telephone Encounter (Signed)
Called and spoke to pt. Pt c/o prod cough with yellow mucus (this started as clear and today has turned yellow), chest congestion and fatigue x 5 days. Pt denies f/c/s, CP/tightness, SOB, wheezing. Pt states she tested for covid on 7/17 and it was negative. Pt has been vaccinated and boosted. Pt is taking an OTC cough suppressant but not sure of the name. Pt is requesting recs. Pt last seen on 4/29 by Rexene Edison, NP for asthma. Appt scheduled with Eric Form, NP.    Will forward to Sarah as Juluis Rainier.

## 2021-06-02 NOTE — Telephone Encounter (Signed)
I am happy to work her in after she tests today as a video visit, it should be quick if she is interested in that.

## 2021-06-02 NOTE — Telephone Encounter (Signed)
Spoke with pt who refused to have video visit with Judson Roch and states that pt "just wants her Judson Roch) to call something in". Pt states she has had cough since Friday which became productive this morning with yellow tented phloem. Pt denies N/V/D/F/C. Home Covid test taken today was negative. Sarah please advise.

## 2021-06-02 NOTE — Telephone Encounter (Signed)
Carrie Spatz, NP  [name], RN; P [facility] Triage Pool Caller: Unspecified (Today, 10:28 AM) She needs to retest for Covid. One negative test 3 days ago does not mean she does not have Covid. Once she has retested today,  please let me know the results of her test. If there are no appointments with any of the providers inthe office today or tomorrow, them I will send something in.   Spoke with the pt and notified of response per Judson Roch  She verbalized understanding  She will let us know results  I cancelled the ov for today   Will await her call back with results

## 2021-06-07 ENCOUNTER — Telehealth: Payer: Self-pay | Admitting: Adult Health

## 2021-06-07 ENCOUNTER — Other Ambulatory Visit: Payer: Self-pay | Admitting: Adult Health

## 2021-06-07 MED ORDER — BENZONATATE 200 MG PO CAPS: 200.0000 mg | ORAL_CAPSULE | Freq: Three times a day (TID) | ORAL | 1 refills | Status: DC | PRN

## 2021-06-07 MED ORDER — DOXYCYCLINE HYCLATE 100 MG PO TABS: 100.0000 mg | ORAL_TABLET | Freq: Two times a day (BID) | ORAL | 0 refills | Status: DC

## 2021-06-07 MED ORDER — PREDNISONE 20 MG PO TABS
20.0000 mg | ORAL_TABLET | Freq: Every day | ORAL | 0 refills | Status: DC
Start: 2021-06-07 — End: 2021-06-29

## 2021-06-07 NOTE — Telephone Encounter (Signed)
Pt stated that she has been taking the antibiotic, a z-pack, and prednisone and stated that her mucus has turned from yellow to green and she is not feeling any better. Stated that she did have wheezing over the weekend.   [id number] [place]; [phone]

## 2021-06-07 NOTE — Telephone Encounter (Signed)
Noted by triage.  

## 2021-06-07 NOTE — Progress Notes (Signed)
Tessalon sent to pharmacy for cough , see previous phone note.

## 2021-06-07 NOTE — Telephone Encounter (Signed)
10 days of cough, congestion .  Zpack and prednisone not better -last day of meds.  Covid neg .  Wants something else.  Coughing is keeping her up .   Good appetite,  no chest pain , orthopnea, hemoptysis .   Doxcycline /Pred sent to pharm  If not better needs ov  Please contact office for sooner follow up if symptoms do not improve or worsen or seek emergency care

## 2021-06-07 NOTE — Telephone Encounter (Signed)
Please refer to 06/02/21 phone note.  Patient was prescribed zpak and prednisone on 06/02/21. She completed both zpak and prednisone today. She has not had any improvement in sx.  C/o prod cough with yellow to green sputum and wheezing x9d.  Sob is baseline. Denied f/c/s or additional sx.  She is taking mucinex BID, zytrec once daily and symbicort BID.  2 negative home covid test.  Fully vaccinated against covid and flu.  Tammy, please advise. Thanks/

## 2021-06-25 ENCOUNTER — Telehealth: Payer: Self-pay | Admitting: Adult Health

## 2021-06-25 ENCOUNTER — Ambulatory Visit: Payer: Medicare HMO

## 2021-06-25 DIAGNOSIS — R0989 Other specified symptoms and signs involving the circulatory and respiratory systems: Secondary | ICD-10-CM

## 2021-06-25 NOTE — Telephone Encounter (Signed)
I know her well, you can double book me on Tuesday if she wants to see me   Please contact office for sooner follow up if symptoms do not improve or worsen or seek emergency care

## 2021-06-25 NOTE — Telephone Encounter (Signed)
Call made to patient, confirmed DOB. She reports she has taken two rounds of antibiotics and prednisone and she is till congested. She is using Symbicort twice daily. Reports productive cough with thick yellow mucous. She confirms she is taking zyrtec, mucinex, and using OTC cough medicine with no result. Denies fever, chills, sore throat, or body aches. Reports wheezing has improved.   TP please advise. Thanks

## 2021-06-25 NOTE — Telephone Encounter (Signed)
Called pt and there was no answer-LMTCB °

## 2021-06-25 NOTE — Telephone Encounter (Signed)
Called and spoke with patient and offered for her to see TP on Tuesday, she will come at 9:45 am for CXR and 10 am for OV.  Advised to see care at [facility] if needed prior to appointment.  She verbalized understanding.  Nothing further needed.

## 2021-06-25 NOTE — Telephone Encounter (Signed)
Not seen since 02/2021, no recent cxr  Will need ov in office with chest xray -further evaluation  Please contact office for sooner follow up if symptoms do not improve or worsen or seek emergency care

## 2021-06-25 NOTE — Telephone Encounter (Signed)
Called and spoke with patient, advised her of the recommendations per Rexene Edison NP.  She stated that she already had an appointment to see Tammy either next week or the following week.  Advised her that her appointment was on 8/25 at 4:30 pm.  I let her know that we did not have any sooner appointments that her already scheduled appointment, however, I would put in for the CXR and she could come in anytime between 8:30 am-12:45 pm and 1:45 pm and 5 pm to have it done.  She stated she would come in on Tuesday to have it done.  I let her know if anything abnormal showed up we would call her and let her know with recommendations from [place].  She said "so I am going to have to put up with this until the 25th?  If you all heard me in the morning you would not be doing this to me.  I sound better as the day goes on".  Advised her that we did not have any appointment any sooner, but if the cxr shows any abnormality we can treat what the x-ray shows.  I also let her know that we did not have any sooner appointments than what she already had scheduled, but she would come and have the CXR done on Tuesday.  She verbalized understanding.  Tammy, Just an FYI, The patient already had an appointment for 8/25, I looked for something next week and we did not have anything available.  She was not happy, but I told her to come by and have the CXR done and if it shows anything abnormal we will call her and let her know and treat anything the x-ray shows.  She will come on Tuesday to have the CXr done.

## 2021-06-28 ENCOUNTER — Telehealth: Payer: Self-pay | Admitting: Adult Health

## 2021-06-28 NOTE — Telephone Encounter (Signed)
Called and spoke with patient, verified the information she provided to the front staff when she called initially.  She stated the information was correct.  I advised her to get covid tested, it could be a home test if she has one.  Advised her to call the office if she gets a positive result and we will change her appointment to a mychart video visit in stead.  Will leave in triage in case she gets a positive covid test.

## 2021-06-28 NOTE — Telephone Encounter (Signed)
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 Pls regard; [phone].   **Will route high priority being that the pt does have an appt tomorrow**

## 2021-06-28 NOTE — Telephone Encounter (Signed)
Attempted to call pt but unable to reach. Left message for her to return call. 

## 2021-06-28 NOTE — Telephone Encounter (Signed)
Called patient but she did not answer. Left message for her to call back.  

## 2021-06-28 NOTE — Telephone Encounter (Addendum)
Pt is returning phone call in regards to appt tomorrow. Pls regard; [phone].   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

## 2021-06-29 ENCOUNTER — Ambulatory Visit (INDEPENDENT_AMBULATORY_CARE_PROVIDER_SITE_OTHER): Payer: Medicare HMO

## 2021-06-29 ENCOUNTER — Ambulatory Visit: Payer: Medicare HMO | Admitting: Adult Health

## 2021-06-29 ENCOUNTER — Encounter: Payer: Self-pay | Admitting: Adult Health

## 2021-06-29 ENCOUNTER — Other Ambulatory Visit: Payer: Self-pay

## 2021-06-29 VITALS — BP 130/70 | HR 92 | Temp 98.5°F | Ht 65.0 in | Wt 211.8 lb

## 2021-06-29 DIAGNOSIS — R0989 Other specified symptoms and signs involving the circulatory and respiratory systems: Secondary | ICD-10-CM | POA: Diagnosis not present

## 2021-06-29 DIAGNOSIS — J4540 Moderate persistent asthma, uncomplicated: Secondary | ICD-10-CM | POA: Diagnosis not present

## 2021-06-29 DIAGNOSIS — I517 Cardiomegaly: Secondary | ICD-10-CM | POA: Diagnosis not present

## 2021-06-29 DIAGNOSIS — R059 Cough, unspecified: Secondary | ICD-10-CM

## 2021-06-29 MED ORDER — BUDESONIDE-FORMOTEROL FUMARATE 160-4.5 MCG/ACT IN AERO
2.0000 | INHALATION_SPRAY | Freq: Two times a day (BID) | RESPIRATORY_TRACT | 5 refills | Status: DC
Start: 2021-06-29 — End: 2022-12-13

## 2021-06-29 MED ORDER — BENZONATATE 200 MG PO CAPS: 200.0000 mg | ORAL_CAPSULE | Freq: Three times a day (TID) | ORAL | 1 refills | Status: AC | PRN

## 2021-06-29 MED ORDER — ALBUTEROL SULFATE HFA 108 (90 BASE) MCG/ACT IN AERS: 1.0000 | INHALATION_SPRAY | Freq: Four times a day (QID) | RESPIRATORY_TRACT | 1 refills | Status: DC | PRN

## 2021-06-29 NOTE — Telephone Encounter (Signed)
Called and spoke with Patient.  Patient was seen in office today by Lynelle Smoke, NP. CXR results given.  Nothing further at this time.   06/29/21- cxr Tammy, NP- Chest xray shows no acute process Continue on current regimen and follow up

## 2021-06-29 NOTE — Patient Instructions (Addendum)
Sputum Culture today .  Mucinex Twice daily  As needed  cough/congestion  Delsym 2 tsp Twice daily  as needed for cough  Tessalon Three times a day  As needed  cough .  Continue on Symbiocrt 2 puffs Twice daily   , rinse after use.  Zyrtec '10mg'$  At bedtime As needed   Astelin and QNASAL nasal spray As needed   ProAir As needed  Wheezing .  Activity as tolerated.  Follow up in office in 6-8 weeks with Dr. Valeta Harms or Dulcemaria Bula and As needed

## 2021-06-29 NOTE — Telephone Encounter (Signed)
ATC, left VM. 

## 2021-06-29 NOTE — Progress Notes (Signed)
$'@Patient'S$  ID: Carrie Perkins, female    DOB: 19-Sep-1951, 70 y.o.   MRN: [id number]  Chief Complaint  Patient presents with   Follow-up   Acute Visit    Referring provider: Lesleigh Noe, MD  HPI: 70 year old female never smoker followed for asthma and allergic rhinitis Medical history significant for diastolic dysfunction and obstructive sleep apnea declines CPAP Patient [place]> she had sleep study 2008 w/ AHI=22 & desat tp 80%; she declined CPAP    TEST/EVENTS :  2D echo 2015 EF [id number], 2 diastolic dysfunction   Chest x-ray January 13, 2020 showed clear lungs.   PFTs August 28, 2020 showed FEV1 at 83%, ratio 87, FVC 72%, no significant bronchodilator response, DLCO 92%.  06/29/2021 Acute OV : Asthma/AR  Patient presents for an acute office visit.  She complains over the last 3 weeks that she has developed and a cough, wheezing and congestion.  She was initially called in a Z-Pak and a prednisone taper.  Patient only had minimal improvement in symptoms.  She was sent in doxycycline and extension of prednisone.  Patient says that some symptoms are improved but she continues to have ongoing cough that is very aggravating COVID-19 testing at home was negative x 2 .  She remains on Symbicort twice daily.  Zyrtec, Astelin and Qnasl. She denies any chest pain orthopnea PND or increased leg swelling Taking Mucinex and Deslym, cough is worse in am and At bedtime  . Coughs up green mucus first thing in am then clears as day goes on.     Allergies  Allergen Reactions   Amoxicillin-Pot Clavulanate     REACTION: severe abd pain---intolerant   Biaxin [Clarithromycin] Other (See Comments)    Caused severe stomach pain     Immunization History  Administered Date(s) Administered   Influenza Split 12/12/2011   Influenza Whole 08/14/2009   PFIZER Comirnaty(Gray Top)Covid-19 Tri-Sucrose Vaccine 08/28/2020   PFIZER(Purple Top)SARS-COV-2 Vaccination 12/20/2019, 01/14/2020    Tdap 11/14/2010    Past Medical History:  Diagnosis Date   Allergic rhinitis, cause unspecified    Obstructive sleep apnea (adult) (pediatric)    Osteoarthrosis, unspecified whether generalized or localized, unspecified site    Other and unspecified hyperlipidemia    Other dyspnea and respiratory abnormality    Overweight(278.02)    Unspecified asthma(493.90)    Unspecified urinary incontinence     Tobacco History: Social History   Tobacco Use  Smoking Status Never  Smokeless Tobacco Never   Counseling given: Not Answered   Outpatient Medications Prior to Visit  Medication Sig Dispense Refill   aspirin EC 81 MG tablet Take 81 mg by mouth daily.     azelastine (ASTELIN) 0.1 % nasal spray Place 1 spray into both nostrils 2 (two) times daily. Use in each nostril as directed 90 mL 1   Beclomethasone Dipropionate (QNASL) 80 MCG/ACT AERS Place 2 sprays into the nose at bedtime. 3 each 2   cetirizine (ZYRTEC) 10 MG chewable tablet Chew 10 mg by mouth 2 (two) times daily.     Cholecalciferol 1000 UNITS capsule Take 1,000 Units by mouth daily.     dextromethorphan-guaiFENesin (MUCINEX DM) 30-600 MG per 12 hr tablet Take 2 tablets by mouth every 12 (twelve) hours.     meclizine (ANTIVERT) 25 MG tablet Take 1 tablet (25 mg total) by mouth 3 (three) times daily as needed for dizziness. 60 tablet 0   NON FORMULARY doterra essential oil Mixes in water     Red  Yeast Rice 600 MG CAPS Take 1 capsule by mouth daily.     Zinc Sulfate (ZINC 15 PO) Take by mouth. Not sure of the dose     benzonatate (TESSALON) 200 MG capsule Take 1 capsule (200 mg total) by mouth 3 (three) times daily as needed for cough. 30 capsule 1   budesonide-formoterol (SYMBICORT) 160-4.5 MCG/ACT inhaler Inhale 2 puffs into the lungs 2 (two) times daily. 10.2 g 5   albuterol (PROAIR HFA) 108 (90 Base) MCG/ACT inhaler Inhale 2 puffs into the lungs every 6 (six) hours as needed for wheezing or shortness of breath. (Patient not  taking: Reported on 06/29/2021) 1 Inhaler 5   azithromycin (ZITHROMAX) 250 MG tablet Take 2 tablets on first day, then 1 tablet daily until finished. (Patient not taking: Reported on 06/29/2021) 6 tablet 0   doxycycline (VIBRA-TABS) 100 MG tablet Take 1 tablet (100 mg total) by mouth 2 (two) times daily. (Patient not taking: Reported on 06/29/2021) 14 tablet 0   predniSONE (DELTASONE) 10 MG tablet Take 4 tabs for 2 days, then 3 tabs for 2 days, 2 tabs for 2 days, then 1 tab for 2 days, then stop. (Patient not taking: Reported on 06/29/2021) 20 tablet 0   predniSONE (DELTASONE) 20 MG tablet Take 1 tablet (20 mg total) by mouth daily with breakfast. (Patient not taking: Reported on 06/29/2021) 5 tablet 0   predniSONE (DELTASONE) 20 MG tablet Take 1 tablet (20 mg total) by mouth daily with breakfast. (Patient not taking: Reported on 06/29/2021) 5 tablet 0   No facility-administered medications prior to visit.     Review of Systems:   Constitutional:   No  weight loss, night sweats,  Fevers, chills,  +fatigue, or  lassitude.  HEENT:   No headaches,  Difficulty swallowing,  Tooth/dental problems, or  Sore throat,                No sneezing, itching, ear ache,  +nasal congestion, post nasal drip,   CV:  No chest pain,  Orthopnea, PND, swelling in lower extremities, anasarca, dizziness, palpitations, syncope.   GI  No heartburn, indigestion, abdominal pain, nausea, vomiting, diarrhea, change in bowel habits, loss of appetite, bloody stools.   Resp:   No chest wall deformity  Skin: no rash or lesions.  GU: no dysuria, change in color of urine, no urgency or frequency.  No flank pain, no hematuria   MS:  No joint pain or swelling.  No decreased range of motion.  No back pain.    Physical Exam  BP 130/70 (BP Location: Left Arm, Patient Position: Sitting, Cuff Size: Large)   Pulse 92   Temp 98.5 F (36.9 C) (Oral)   Ht '5\' 5"'$  (1.651 m)   Wt 211 lb 12.8 oz (96.1 kg)   SpO2 96%   BMI 35.25  kg/m   GEN: A/Ox3; pleasant , NAD, well nourished    HEENT:  [place]/AT,  NOSE-clear, THROAT-clear, no lesions, no postnasal drip or exudate noted.   NECK:  Supple w/ fair ROM; no JVD; normal carotid impulses w/o bruits; no thyromegaly or nodules palpated; no lymphadenopathy.    RESP  Clear  P & A; w/o, wheezes/ rales/ or rhonchi. no accessory muscle use, no dullness to percussion  CARD:  RRR, no m/r/g, no peripheral edema, pulses intact, no cyanosis or clubbing.  GI:   Soft & nt; nml bowel sounds; no organomegaly or masses detected.   Musco: Warm bil, no deformities or joint swelling  noted.   Neuro: alert, no focal deficits noted.    Skin: Warm, no lesions or rashes    Lab Results:   BNP No results found for: BNP  ProBNP No results found for: PROBNP  Imaging: No results found.    PFT Results Latest Ref Rng & Units 08/28/2020  FVC-Pre L 2.24  FVC-Predicted Pre % 72  FVC-Post L 2.24  FVC-Predicted Post % 72  Pre FEV1/FVC % % 84  Post FEV1/FCV % % 87  FEV1-Pre L 1.89  FEV1-Predicted Pre % 80  FEV1-Post L 1.95  DLCO uncorrected ml/min/mmHg 18.53  DLCO UNC% % 92  DLCO corrected ml/min/mmHg 18.53  DLCO COR %Predicted % 92  DLVA Predicted % 120  TLC L 3.91  TLC % Predicted % 75  RV % Predicted % 71    No results found for: NITRICOXIDE      Assessment & Plan:   Asthma Slow to resolve Asthmatic Bronchitic exacerbation  Check chest xray today  Hold on additional abx , check sputum culture.  Add cough control regimen .   Plan  Patient Instructions  Sputum Culture today .  Mucinex Twice daily  As needed  cough/congestion  Delsym 2 tsp Twice daily  as needed for cough  Tessalon Three times a day  As needed  cough .  Continue on Symbiocrt 2 puffs Twice daily   , rinse after use.  Zyrtec '10mg'$  At bedtime As needed   Astelin and QNASAL nasal spray As needed   ProAir As needed  Wheezing .  Activity as tolerated.  Follow up in office in 6-8 weeks with Dr.  Valeta Harms or Triva Hueber and As needed         I spent   30 minutes dedicated to the care of this patient on the date of this encounter to include pre-visit review of records, face-to-face time with the patient discussing conditions above, post visit ordering of testing, clinical documentation with the electronic health record, making appropriate referrals as documented, and communicating necessary findings to members of the patients care team.    Rexene Edison, NP 06/29/2021

## 2021-06-29 NOTE — Telephone Encounter (Signed)
Pt is returning phone call. Pls regard; [phone]

## 2021-06-29 NOTE — Assessment & Plan Note (Signed)
Slow to resolve Asthmatic Bronchitic exacerbation  Check chest xray today  Hold on additional abx , check sputum culture.  Add cough control regimen .   Plan  Patient Instructions  Sputum Culture today .  Mucinex Twice daily  As needed  cough/congestion  Delsym 2 tsp Twice daily  as needed for cough  Tessalon Three times a day  As needed  cough .  Continue on Symbiocrt 2 puffs Twice daily   , rinse after use.  Zyrtec '10mg'$  At bedtime As needed   Astelin and QNASAL nasal spray As needed   ProAir As needed  Wheezing .  Activity as tolerated.  Follow up in office in 6-8 weeks with Dr. Valeta Harms or Santino Kinsella and As needed

## 2021-06-29 NOTE — Telephone Encounter (Signed)
Pt is returning phone call from our office. Pls regard; [phone].

## 2021-07-01 ENCOUNTER — Ambulatory Visit: Payer: Medicare HMO | Admitting: Adult Health

## 2021-07-01 ENCOUNTER — Other Ambulatory Visit: Payer: Medicare HMO

## 2021-07-01 DIAGNOSIS — R059 Cough, unspecified: Secondary | ICD-10-CM

## 2021-07-01 NOTE — Addendum Note (Signed)
Addended by: Vanessa Barbara on: 07/01/2021 10:12 AM   Modules accepted: Orders

## 2021-07-04 LAB — RESPIRATORY CULTURE OR RESPIRATORY AND SPUTUM CULTURE
MICRO NUMBER:: 12260641
RESULT:: NORMAL
SPECIMEN QUALITY:: ADEQUATE

## 2021-07-06 ENCOUNTER — Ambulatory Visit (INDEPENDENT_AMBULATORY_CARE_PROVIDER_SITE_OTHER): Payer: Medicare HMO | Admitting: Family Medicine

## 2021-07-06 ENCOUNTER — Other Ambulatory Visit: Payer: Self-pay

## 2021-07-06 ENCOUNTER — Other Ambulatory Visit: Payer: Self-pay | Admitting: Family Medicine

## 2021-07-06 VITALS — BP 112/68 | HR 98 | Temp 97.6°F | Ht 65.0 in | Wt 212.0 lb

## 2021-07-06 DIAGNOSIS — J4540 Moderate persistent asthma, uncomplicated: Secondary | ICD-10-CM

## 2021-07-06 DIAGNOSIS — R7301 Impaired fasting glucose: Secondary | ICD-10-CM

## 2021-07-06 DIAGNOSIS — I517 Cardiomegaly: Secondary | ICD-10-CM | POA: Diagnosis not present

## 2021-07-06 DIAGNOSIS — Z23 Encounter for immunization: Secondary | ICD-10-CM

## 2021-07-06 DIAGNOSIS — E663 Overweight: Secondary | ICD-10-CM | POA: Diagnosis not present

## 2021-07-06 DIAGNOSIS — E782 Mixed hyperlipidemia: Secondary | ICD-10-CM

## 2021-07-06 LAB — COMPREHENSIVE METABOLIC PANEL
ALT: 25 U/L (ref 0–35)
AST: 18 U/L (ref 0–37)
Albumin: 4 g/dL (ref 3.5–5.2)
Alkaline Phosphatase: 71 U/L (ref 39–117)
BUN: 9 mg/dL (ref 6–23)
CO2: 27 mEq/L (ref 19–32)
Calcium: 9.2 mg/dL (ref 8.4–10.5)
Chloride: 99 mEq/L (ref 96–112)
Creatinine, Ser: 0.59 mg/dL (ref 0.40–1.20)
GFR: 90.37 mL/min (ref >60.00)
Glucose, Bld: 319 mg/dL — ABNORMAL HIGH (ref 70–99)
Potassium: 3.8 mEq/L (ref 3.5–5.1)
Sodium: 135 mEq/L (ref 135–145)
Total Bilirubin: 0.7 mg/dL (ref 0.2–1.2)
Total Protein: 6.6 g/dL (ref 6.0–8.3)

## 2021-07-06 LAB — LIPID PANEL
Cholesterol: 219 mg/dL — ABNORMAL HIGH (ref 0–200)
HDL: 48.6 mg/dL (ref >39.00)
NonHDL: 170.52
Total CHOL/HDL Ratio: 5
Triglycerides: 257 mg/dL — ABNORMAL HIGH (ref 0.0–149.0)
VLDL: 51.4 mg/dL — ABNORMAL HIGH (ref 0.0–40.0)

## 2021-07-06 LAB — LDL CHOLESTEROL, DIRECT: Direct LDL: 140 mg/dL

## 2021-07-06 NOTE — Assessment & Plan Note (Signed)
Cold symptoms and cough improving after 2 rounds of steroids and abx. Continue to monitor symptoms. No need for additional treatment. Cont symbicort and albuterol. Appreciate pulm support

## 2021-07-06 NOTE — Progress Notes (Signed)
Subjective:     Carrie Perkins is a 70 y.o. female presenting for Nasal Congestion (All symptoms x 4 weeks. Home tested negative on 06/28/21. Had chest xray last week. ), Cough, and Sore Throat     Cough This is a new problem. The current episode started more than 1 month ago. The cough is Productive of sputum. Associated symptoms include a sore throat. Pertinent negatives include no chills, ear congestion, ear pain, fever, nasal congestion, postnasal drip, rhinorrhea or shortness of breath.  Sore Throat  Associated symptoms include coughing. Pertinent negatives include no ear pain or shortness of breath.   Was terrible on Saturday and now is finally feeling better Had 2 rounds of abx Saw her pulmonary doctor - had abx and steroids x 2   Forgot her cough pill today  Also concerned about enlarged heart  Review of Systems  Constitutional:  Negative for chills and fever.  HENT:  Positive for sore throat. Negative for ear pain, postnasal drip and rhinorrhea.   Respiratory:  Positive for cough. Negative for shortness of breath.     Social History   Tobacco Use  Smoking Status Never  Smokeless Tobacco Never        Objective:    BP Readings from Last 3 Encounters:  07/06/21 112/68  06/29/21 130/70  03/12/21 118/72   Wt Readings from Last 3 Encounters:  07/06/21 212 lb (96.2 kg)  06/29/21 211 lb 12.8 oz (96.1 kg)  03/12/21 214 lb (97.1 kg)    BP 112/68   Pulse 98   Temp 97.6 F (36.4 C) (Temporal)   Ht '5\' 5"'$  (1.651 m)   Wt 212 lb (96.2 kg)   SpO2 96%   BMI 35.28 kg/m    Physical Exam Constitutional:      General: She is not in acute distress.    Appearance: She is well-developed. She is not diaphoretic.  HENT:     Head: Normocephalic and atraumatic.     Right Ear: Tympanic membrane, ear canal and external ear normal.     Left Ear: Tympanic membrane, ear canal and external ear normal.     Nose: Nose normal.     Right Sinus: No maxillary sinus tenderness or  frontal sinus tenderness.     Left Sinus: No maxillary sinus tenderness or frontal sinus tenderness.     Mouth/Throat:     Pharynx: Uvula midline. Posterior oropharyngeal erythema present. No oropharyngeal exudate.     Tonsils: 0 on the right. 0 on the left.  Eyes:     General: No scleral icterus.    Conjunctiva/sclera: Conjunctivae normal.  Cardiovascular:     Rate and Rhythm: Normal rate and regular rhythm.     Heart sounds: Normal heart sounds. No murmur heard. Pulmonary:     Effort: Pulmonary effort is normal. No respiratory distress.     Breath sounds: Normal breath sounds.  Musculoskeletal:     Cervical back: Neck supple.  Lymphadenopathy:     Cervical: No cervical adenopathy.  Skin:    General: Skin is warm and dry.     Capillary Refill: Capillary refill takes less than 2 seconds.  Neurological:     Mental Status: She is alert. Mental status is at baseline.  Psychiatric:        Mood and Affect: Mood normal.        Behavior: Behavior normal.          Assessment & Plan:   Problem List Items Addressed This Visit  Cardiovascular and Mediastinum   LVH (left ventricular hypertrophy)    Pt concerned about recent CXR with enlarged cardiac boarder. Reviewed CXR from 2022 and 2021 similar appearing size of heart. Reviewed echo with mild LVH but otherwise reassuring. No htn and bp well controlled. Discussed warning signs for CHF no symptoms currently and reassurance provided.         Respiratory   Asthma    Cold symptoms and cough improving after 2 rounds of steroids and abx. Continue to monitor symptoms. No need for additional treatment. Cont symbicort and albuterol. Appreciate pulm support        Other   Hyperlipidemia    Overdue for labs. Declines statin therapy due to concern for side effects. Discuss reduce risk of MI/Stroke. Will recheck today. Cont red yeast rice and healthy diet.        Relevant Orders   Lipid panel   Overweight - Primary   Relevant  Orders   Comprehensive metabolic panel   Other Visit Diagnoses     Need for vaccination against Streptococcus pneumoniae       Relevant Orders   Pneumococcal conjugate vaccine 20-valent (Prevnar 20) (Completed)      I spent >25 minutes with pt , obtaining history, examining, reviewing chart, documenting encounter and discussing the above plan of care.   Return if symptoms worsen or fail to improve.  Lesleigh Noe, MD  This visit occurred during the SARS-CoV-2 public health emergency.  Safety protocols were in place, including screening questions prior to the visit, additional usage of staff PPE, and extensive cleaning of exam room while observing appropriate contact time as indicated for disinfecting solutions.

## 2021-07-06 NOTE — Patient Instructions (Signed)
Continue your cold treatment plan  OK to restart aspirin 81 mg  Reach out if any leg swelling or high blood pressure or new shortness of breath

## 2021-07-06 NOTE — Assessment & Plan Note (Signed)
Overdue for labs. Declines statin therapy due to concern for side effects. Discuss reduce risk of MI/Stroke. Will recheck today. Cont red yeast rice and healthy diet.

## 2021-07-06 NOTE — Assessment & Plan Note (Signed)
Pt concerned about recent CXR with enlarged cardiac boarder. Reviewed CXR from 2022 and 2021 similar appearing size of heart. Reviewed echo with mild LVH but otherwise reassuring. No htn and bp well controlled. Discussed warning signs for CHF no symptoms currently and reassurance provided.

## 2021-07-08 ENCOUNTER — Ambulatory Visit: Payer: Medicare HMO | Admitting: Adult Health

## 2021-08-24 ENCOUNTER — Ambulatory Visit: Payer: Medicare HMO | Admitting: Adult Health

## 2021-12-06 DIAGNOSIS — R9431 Abnormal electrocardiogram [ECG] [EKG]: Secondary | ICD-10-CM | POA: Diagnosis not present

## 2021-12-06 DIAGNOSIS — R002 Palpitations: Secondary | ICD-10-CM | POA: Diagnosis not present

## 2022-02-15 ENCOUNTER — Other Ambulatory Visit: Payer: Self-pay | Admitting: Family Medicine

## 2022-02-15 DIAGNOSIS — Z1231 Encounter for screening mammogram for malignant neoplasm of breast: Secondary | ICD-10-CM

## 2022-03-03 ENCOUNTER — Encounter: Payer: Self-pay | Admitting: Family Medicine

## 2022-03-03 ENCOUNTER — Ambulatory Visit (INDEPENDENT_AMBULATORY_CARE_PROVIDER_SITE_OTHER): Payer: Medicare HMO | Admitting: Family Medicine

## 2022-03-03 ENCOUNTER — Ambulatory Visit: Payer: Medicare HMO | Admitting: Family Medicine

## 2022-03-03 ENCOUNTER — Ambulatory Visit (INDEPENDENT_AMBULATORY_CARE_PROVIDER_SITE_OTHER)
Admission: RE | Admit: 2022-03-03 | Discharge: 2022-03-03 | Disposition: A | Discharge status: Home / Self Care | Payer: Medicare HMO | Source: Ambulatory Visit | Attending: Family Medicine | Admitting: Family Medicine

## 2022-03-03 VITALS — BP 130/80 | HR 92 | Temp 98.8°F | Ht 65.0 in | Wt 212.1 lb

## 2022-03-03 DIAGNOSIS — M1610 Unilateral primary osteoarthritis, unspecified hip: Secondary | ICD-10-CM | POA: Diagnosis not present

## 2022-03-03 DIAGNOSIS — M25551 Pain in right hip: Secondary | ICD-10-CM

## 2022-03-03 DIAGNOSIS — M1611 Unilateral primary osteoarthritis, right hip: Secondary | ICD-10-CM | POA: Diagnosis not present

## 2022-03-03 NOTE — Progress Notes (Signed)
? ? ?Lamont Glasscock T. Dennise Bamber, MD, [facility] ?Therapist, music at [facility] ?[address] ?[place], [address] ? ?Phone: [phone]  FAX: [phone] ? ?Carrie Perkins - 71 y.o. female  MRN [id number]  Date of Birth: 05-Jun-1951 ? ?Date: 03/03/2022  PCP: Lesleigh Noe, MD  Referral: Lesleigh Noe, MD ? ?Chief Complaint  ?Patient presents with  ? Hip Pain  ?  Right x 3 months  ? ? ?This visit occurred during the SARS-CoV-2 public health emergency.  Safety protocols were in place, including screening questions prior to the visit, additional usage of staff PPE, and extensive cleaning of exam room while observing appropriate contact time as indicated for disinfecting solutions.  ? ?Subjective:  ? ?Carrie Perkins is a 71 y.o. very pleasant female patient with Body mass index is 35.29 kg/m?. who presents with the following: ? ?This pleasant 71 year old female is here for evaluation of right-sided hip pain for several months. ? ?She has been having right-sided hip for a few months.  She did go to a festival in [facility], and she walked for about 3 miles.  After this she did develop some pain in the anterior groin as well as in the posterior buttocks region. ? ?She denies any significant new or different back pain.  She does have some back pain in general but this is different than her baseline.  She is not having any numbness, tingling, or radicular pain down the legs.  She does have some mild lateral pain only. ? ?She does have some pain when she sits.  She is having some trouble putting on her socks and her pants, she is able to get in and out of a car easily, and she goes up and down stairs without a problem ? ?Review of Systems is noted in the HPI, as appropriate ? ?Objective:  ? ?BP 130/80   Pulse 92   Temp 98.8 ?F (37.1 ?C) (Oral)   Ht '5\' 5"'$  (1.651 m)   Wt 212 lb 1 oz (96.2 kg)   SpO2 96%   BMI 35.29 kg/m?  ? ?GEN: No acute distress; alert,appropriate. ?PULM: Breathing  comfortably in no respiratory distress ?PSYCH: Normally interactive.  ? ? ?Range of motion at  the waist: Flexion, extension, lateral bending and rotation: Full ? ?No echymosis or edema ?Rises to examination table with mild difficulty ?Gait: minimally antalgic ? ?Inspection/Deformity: N ?Paraspinus Tenderness: Minimally antalgic ? ?B Ankle Dorsiflexion (L5,4): 5/5 ?B Great Toe Dorsiflexion (L5,4): 5/5 ?Rise/Squat (L4): WNL, mild pain ? ?SENSORY ?B Medial Foot (L4): WNL ?B Dorsum (L5): WNL ?B Lateral (S1): WNL ?Light Touch: WNL ?Pinprick: WNL ? ?B SLR, seated: neg ?B SLR, supine: neg ?B FABER: neg ?B Reverse FABER: neg ? ? ?HIP EXAM: SIDE: Right ?ROM: Abduction, Flexion, Internal and External range of motion: Abduction approaches full.  She is able to flex her hip to 90 degrees, and internal and external range of motion is diminished compared to the contralateral side, but she is having roughly 25% degree loss of motion.  Contralateral side ?Pain with terminal IROM and EROM: Yes ?GTB: Mild tenderness ?SLR: NEG ?Knees: No effusion ?FABER: NT ?REVERSE FABER: NT, neg ?Piriformis: NT at direct palpation ?Str: flexion: 3+ - 4-/5 ?abduction: 4/5 ?adduction: 4-/5 ?Strength testing mildly tender ?  ? ?Laboratory and Imaging Data: ? ?Assessment and Plan:  ? ?  ICD-10-CM   ?1. Acute right hip pain  M25.551 DG HIP UNILAT WITH PELVIS 2-3 VIEWS RIGHT  ?  ? ?  Acute on chronic hip pain with exacerbation.  On the plain film, I do see some joint space narrowing to be consistent with some mild osteoarthritis. ? ?Her back is not seem to be primary issue, and think that this is in the deep hip joint.  Think that strengthening of the core and hip muscles will be primary goal here for long-term assistance.  Gave her rehab for [employer]. ? ?I suggested some NSAIDs, but for now she would like to stick to Tylenol.  Reasonable. ? ?If she is not making good progress in 2 or 3 weeks, I would like to set her up with  formal physical therapy.  Before COVID, she was working out routinely ? ?Social: Hip pain is limiting her ability to exercise ? ?No orders of the defined types were placed in this encounter. ? ?There are no discontinued medications. ?Orders Placed This Encounter  ?Procedures  ? DG HIP UNILAT WITH PELVIS 2-3 VIEWS RIGHT  ? ? ?Follow-up: As needed only ? ?Dragon [employer] One speech-to-text software was used for transcription in this dictation.  Possible transcriptional errors can occur using Editor, commissioning.  ? ?Signed, ? ?Ayad Nieman T. Angelle Isais, MD ? ? ?Outpatient Encounter Medications as of 03/03/2022  ?Medication Sig  ? albuterol (PROAIR HFA) 108 (90 Base) MCG/ACT inhaler Inhale 1-2 puffs into the lungs every 6 (six) hours as needed for wheezing or shortness of breath.  ? aspirin EC 81 MG tablet Take 81 mg by mouth daily.  ? azelastine (ASTELIN) 0.1 % nasal spray Place 1 spray into both nostrils 2 (two) times daily. Use in each nostril as directed  ? Beclomethasone Dipropionate (QNASL) 80 MCG/ACT AERS Place 2 sprays into the nose at bedtime.  ? benzonatate (TESSALON) 200 MG capsule Take 1 capsule (200 mg total) by mouth 3 (three) times daily as needed for cough.  ? budesonide-formoterol (SYMBICORT) 160-4.5 MCG/ACT inhaler Inhale 2 puffs into the lungs 2 (two) times daily.  ? cetirizine (ZYRTEC) 10 MG chewable tablet Chew 10 mg by mouth 2 (two) times daily.  ? Cholecalciferol 1000 UNITS capsule Take 1,000 Units by mouth daily.  ? dextromethorphan-guaiFENesin (MUCINEX DM) 30-600 MG per 12 hr tablet Take 2 tablets by mouth every 12 (twelve) hours.  ? meclizine (ANTIVERT) 25 MG tablet Take 1 tablet (25 mg total) by mouth 3 (three) times daily as needed for dizziness.  ? NON FORMULARY doterra essential oil ?Mixes in water  ? Red Yeast Rice 600 MG CAPS Take 1 capsule by mouth daily.  ? Zinc Sulfate (ZINC 15 PO) Take by mouth. Not sure of the dose  ? ?No facility-administered encounter medications on file as of 03/03/2022.  ?  ?

## 2022-05-09 ENCOUNTER — Ambulatory Visit
Admission: RE | Admit: 2022-05-09 | Discharge: 2022-05-09 | Disposition: A | Discharge status: Home / Self Care | Payer: Medicare HMO | Source: Ambulatory Visit | Attending: Family Medicine | Admitting: Family Medicine

## 2022-05-09 DIAGNOSIS — Z1231 Encounter for screening mammogram for malignant neoplasm of breast: Secondary | ICD-10-CM

## 2022-05-10 ENCOUNTER — Telehealth: Payer: Self-pay | Admitting: Family Medicine

## 2022-05-10 NOTE — Telephone Encounter (Signed)
Left message for patient to call back and schedule Medicare Annual Wellness Visit (AWV) either virtually or phone   awvi 10/15/15 per [facility]    This should be a 45 minute visit.  I left my direct # [phone]

## 2022-05-16 ENCOUNTER — Ambulatory Visit (INDEPENDENT_AMBULATORY_CARE_PROVIDER_SITE_OTHER): Payer: Medicare HMO

## 2022-05-16 VITALS — Ht 66.0 in | Wt 209.0 lb

## 2022-05-16 DIAGNOSIS — Z0000 Encounter for general adult medical examination without abnormal findings: Secondary | ICD-10-CM | POA: Diagnosis not present

## 2022-05-16 NOTE — Patient Instructions (Signed)
Ms. Carrie Perkins , Thank you for taking time to come for your Medicare Wellness Visit. I appreciate your ongoing commitment to your health goals. Please review the following plan we discussed and let me know if I can assist you in the future.   Screening recommendations/referrals: Colonoscopy: completed 12/09/2019, due 12/08/2024 Mammogram: completed 05/09/2022, due 05/11/2023 Bone Density: completed 09/07/2016 Recommended yearly ophthalmology/optometry visit for glaucoma screening and checkup Recommended yearly dental visit for hygiene and checkup  Vaccinations: Influenza vaccine: decline Pneumococcal vaccine: completed 07/06/2021 Tdap vaccine: due Shingles vaccine: discussed   Covid-19: 08/28/2020, 01/14/2020, 12/20/2019  Advanced directives: Please bring a copy of your POA (Power of Attorney) and/or Living Will to your next appointment.   Conditions/risks identified: none  Next appointment: Follow up in one year for your annual wellness visit    Preventive Care 65 Years and Older, Female Preventive care refers to lifestyle choices and visits with your health care provider that can promote health and wellness. What does preventive care include? A yearly physical exam. This is also called an annual well check. Dental exams once or twice a year. Routine eye exams. Ask your health care provider how often you should have your eyes checked. Personal lifestyle choices, including: Daily care of your teeth and gums. Regular physical activity. Eating a healthy diet. Avoiding tobacco and drug use. Limiting alcohol use. Practicing safe sex. Taking low-dose aspirin every day. Taking vitamin and mineral supplements as recommended by your health care provider. What happens during an annual well check? The services and screenings done by your health care provider during your annual well check will depend on your age, overall health, lifestyle risk factors, and family history of disease. Counseling  Your  health care provider may ask you questions about your: Alcohol use. Tobacco use. Drug use. Emotional well-being. Home and relationship well-being. Sexual activity. Eating habits. History of falls. Memory and ability to understand (cognition). Work and work Statistician. Reproductive health. Screening  You may have the following tests or measurements: Height, weight, and BMI. Blood pressure. Lipid and cholesterol levels. These may be checked every 5 years, or more frequently if you are over 24 years old. Skin check. Lung cancer screening. You may have this screening every year starting at age 17 if you have a 30-pack-year history of smoking and currently smoke or have quit within the past 15 years. Fecal occult blood test (FOBT) of the stool. You may have this test every year starting at age 54. Flexible sigmoidoscopy or colonoscopy. You may have a sigmoidoscopy every 5 years or a colonoscopy every 10 years starting at age 70. Hepatitis C blood test. Hepatitis B blood test. Sexually transmitted disease (STD) testing. Diabetes screening. This is done by checking your blood sugar (glucose) after you have not eaten for a while (fasting). You may have this done every 1-3 years. Bone density scan. This is done to screen for osteoporosis. You may have this done starting at age 83. Mammogram. This may be done every 1-2 years. Talk to your health care provider about how often you should have regular mammograms. Talk with your health care provider about your test results, treatment options, and if necessary, the need for more tests. Vaccines  Your health care provider may recommend certain vaccines, such as: Influenza vaccine. This is recommended every year. Tetanus, diphtheria, and acellular pertussis (Tdap, Td) vaccine. You may need a Td booster every 10 years. Zoster vaccine. You may need this after age 25. Pneumococcal 13-valent conjugate (PCV13) vaccine. One dose  is recommended after age  73. Pneumococcal polysaccharide (PPSV23) vaccine. One dose is recommended after age 7. Talk to your health care provider about which screenings and vaccines you need and how often you need them. This information is not intended to replace advice given to you by your health care provider. Make sure you discuss any questions you have with your health care provider. Document Released: 11/27/2015 Document Revised: 07/20/2016 Document Reviewed: 09/01/2015 Elsevier Interactive Patient Education  2017 [place] Prevention in the Home Falls can cause injuries. They can happen to people of all ages. There are many things you can do to make your home safe and to help prevent falls. What can I do on the outside of my home? Regularly fix the edges of walkways and driveways and fix any cracks. Remove anything that might make you trip as you walk through a door, such as a raised step or threshold. Trim any bushes or trees on the path to your home. Use bright outdoor lighting. Clear any walking paths of anything that might make someone trip, such as rocks or tools. Regularly check to see if handrails are loose or broken. Make sure that both sides of any steps have handrails. Any raised decks and porches should have guardrails on the edges. Have any leaves, snow, or ice cleared regularly. Use sand or salt on walking paths during winter. Clean up any spills in your garage right away. This includes oil or grease spills. What can I do in the bathroom? Use night lights. Install grab bars by the toilet and in the tub and shower. Do not use towel bars as grab bars. Use non-skid mats or decals in the tub or shower. If you need to sit down in the shower, use a plastic, non-slip stool. Keep the floor dry. Clean up any water that spills on the floor as soon as it happens. Remove soap buildup in the tub or shower regularly. Attach bath mats securely with double-sided non-slip rug tape. Do not have throw  rugs and other things on the floor that can make you trip. What can I do in the bedroom? Use night lights. Make sure that you have a light by your bed that is easy to reach. Do not use any sheets or blankets that are too big for your bed. They should not hang down onto the floor. Have a firm chair that has side arms. You can use this for support while you get dressed. Do not have throw rugs and other things on the floor that can make you trip. What can I do in the kitchen? Clean up any spills right away. Avoid walking on wet floors. Keep items that you use a lot in easy-to-reach places. If you need to reach something above you, use a strong step stool that has a grab bar. Keep electrical cords out of the way. Do not use floor polish or wax that makes floors slippery. If you must use wax, use non-skid floor wax. Do not have throw rugs and other things on the floor that can make you trip. What can I do with my stairs? Do not leave any items on the stairs. Make sure that there are handrails on both sides of the stairs and use them. Fix handrails that are broken or loose. Make sure that handrails are as long as the stairways. Check any carpeting to make sure that it is firmly attached to the stairs. Fix any carpet that is loose or worn. Avoid  having throw rugs at the top or bottom of the stairs. If you do have throw rugs, attach them to the floor with carpet tape. Make sure that you have a light switch at the top of the stairs and the bottom of the stairs. If you do not have them, ask someone to add them for you. What else can I do to help prevent falls? Wear shoes that: Do not have high heels. Have rubber bottoms. Are comfortable and fit you well. Are closed at the toe. Do not wear sandals. If you use a stepladder: Make sure that it is fully opened. Do not climb a closed stepladder. Make sure that both sides of the stepladder are locked into place. Ask someone to hold it for you, if  possible. Clearly mark and make sure that you can see: Any grab bars or handrails. First and last steps. Where the edge of each step is. Use tools that help you move around (mobility aids) if they are needed. These include: Canes. Walkers. Scooters. Crutches. Turn on the lights when you go into a dark area. Replace any light bulbs as soon as they burn out. Set up your furniture so you have a clear path. Avoid moving your furniture around. If any of your floors are uneven, fix them. If there are any pets around you, be aware of where they are. Review your medicines with your doctor. Some medicines can make you feel dizzy. This can increase your chance of falling. Ask your doctor what other things that you can do to help prevent falls. This information is not intended to replace advice given to you by your health care provider. Make sure you discuss any questions you have with your health care provider. Document Released: 08/27/2009 Document Revised: 04/07/2016 Document Reviewed: 12/05/2014 Elsevier Interactive Patient Education  2017 [employer].

## 2022-05-16 NOTE — Progress Notes (Signed)
I connected with Carrie Perkins today by telephone and verified that I am speaking with the correct person using two identifiers. Location patient: home Location provider: work Persons participating in the virtual visit: Timberlee Roblero, Glenna Durand LPN.   I discussed the limitations, risks, security and privacy concerns of performing an evaluation and management service by telephone and the availability of in person appointments. I also discussed with the patient that there may be a patient responsible charge related to this service. The patient expressed understanding and verbally consented to this telephonic visit.    Interactive audio and video telecommunications were attempted between this provider and patient, however failed, due to patient having technical difficulties OR patient did not have access to video capability.  We continued and completed visit with audio only.     Vital signs may be patient reported or missing.  Subjective:   Carrie Perkins is a 71 y.o. female who presents for an Initial Medicare Annual Wellness Visit.  Review of Systems     Cardiac Risk Factors include: advanced age (>60mn, >>63women);dyslipidemia;obesity (BMI >30kg/m2)     Objective:    Today's Vitals   05/16/22 0957  Weight: 209 lb (94.8 kg)  Height: '5\' 6"'$  (1.676 m)   Body mass index is 33.73 kg/m.     05/16/2022   10:03 AM  Advanced Directives  Does Patient Have a Medical Advance Directive? Yes  Type of AParamedicof A[place]abiLiving will  Copy of [place] Chart? No - copy requested    Current Medications (verified) Outpatient Encounter Medications as of 05/16/2022  Medication Sig   albuterol (PROAIR HFA) 108 (90 Base) MCG/ACT inhaler Inhale 1-2 puffs into the lungs every 6 (six) hours as needed for wheezing or shortness of breath.   aspirin EC 81 MG tablet Take 81 mg by mouth daily.   azelastine (ASTELIN) 0.1 % nasal spray Place 1 spray into  both nostrils 2 (two) times daily. Use in each nostril as directed   Beclomethasone Dipropionate (QNASL) 80 MCG/ACT AERS Place 2 sprays into the nose at bedtime.   benzonatate (TESSALON) 200 MG capsule Take 1 capsule (200 mg total) by mouth 3 (three) times daily as needed for cough.   budesonide-formoterol (SYMBICORT) 160-4.5 MCG/ACT inhaler Inhale 2 puffs into the lungs 2 (two) times daily.   cetirizine (ZYRTEC) 10 MG chewable tablet Chew 10 mg by mouth 2 (two) times daily.   Cholecalciferol 1000 UNITS capsule Take 1,000 Units by mouth daily.   dextromethorphan-guaiFENesin (MUCINEX DM) 30-600 MG per 12 hr tablet Take 2 tablets by mouth every 12 (twelve) hours.   meclizine (ANTIVERT) 25 MG tablet Take 1 tablet (25 mg total) by mouth 3 (three) times daily as needed for dizziness.   NON FORMULARY doterra essential oil Mixes in water   Red Yeast Rice 600 MG CAPS Take 1 capsule by mouth daily.   Zinc Sulfate (ZINC 15 PO) Take by mouth. Not sure of the dose   No facility-administered encounter medications on file as of 05/16/2022.    Allergies (verified) Amoxicillin-pot clavulanate and Biaxin [clarithromycin]   History: Past Medical History:  Diagnosis Date   Allergic rhinitis, cause unspecified    Obstructive sleep apnea (adult) (pediatric)    Osteoarthrosis, unspecified whether generalized or localized, unspecified site    Other and unspecified hyperlipidemia    Other dyspnea and respiratory abnormality    Overweight(278.02)    Unspecified asthma(493.90)    Unspecified urinary incontinence  Past Surgical History:  Procedure Laterality Date   left wrist ORIF after fall  2008   MEDIAL PARTIAL KNEE REPLACEMENT Right 05/2013   done at [facility]---Dr. Bill Salinas   TOTAL KNEE ARTHROPLASTY     TUBAL LIGATION     Family History  Problem Relation Age of Onset   Heart disease Father    Diabetes Father    Stroke Mother 68   Heart disease Mother    Atrial fibrillation Sister    Hyperlipidemia  Sister    Hypertension Sister    Alcohol abuse Brother    Atrial fibrillation Brother    Stroke Maternal Grandmother 22   Social History   Socioeconomic History   Marital status: Divorced    Spouse name: Not on file   Number of children: 2   Years of education: college   Highest education level: Not on file  Occupational History   Occupation: [employer]  Tobacco Use   Smoking status: Never   Smokeless tobacco: Never  Vaping Use   Vaping Use: Never used  Substance and Sexual Activity   Alcohol use: Yes    Alcohol/week: 0.0 standard drinks of alcohol    Comment: rare-socially   Drug use: No   Sexual activity: Not Currently  Other Topics Concern   Not on file  Social History Narrative   10/03/19   From: [place], but moved when she was 71 yo   Living: alone   Work: Runs 2 tags/registration      Family: [place] - 1 grandchild      Enjoys: travel      Exercise: was working out 3 days a week, walks in the neighborhood   Diet: varies, a lot of salad and chicken, enjoys sweets, eating on the go         Safety   Seat belts: Yes    Guns: Yes  and secure   Safe in relationships: Yes    Social Determinants of Radio broadcast assistant Strain: Low Risk  (05/16/2022)   Overall Financial Resource Strain (CARDIA)    Difficulty of Paying Living Expenses: Not hard at all  Food Insecurity: No Food Insecurity (05/16/2022)   Hunger Vital Sign    Worried About Running Out of Food in the Last Year: Never true    [place] in the Last Year: Never true  Transportation Needs: No Transportation Needs (05/16/2022)   PRAPARE - Hydrologist (Medical): No    Lack of Transportation (Non-Medical): No  Physical Activity: Inactive (05/16/2022)   Exercise Vital Sign    Days of Exercise per Week: 0 days    Minutes of Exercise per Session: 0 min  Stress: No Stress Concern Present (05/16/2022)   [place]    Feeling of Stress : Not at all  Social Connections: Not on file    Tobacco Counseling Counseling given: Not Answered   Clinical Intake:  Pre-visit preparation completed: Yes  Pain : No/denies pain     Nutritional Status: BMI > 30  Obese Nutritional Risks: None Diabetes: No  How often do you need to have someone help you when you read instructions, pamphlets, or other written materials from your doctor or pharmacy?: 1 - Never What is the last grade level you completed in school?: college  Diabetic? no  Interpreter Needed?: No  Information entered by :: NAllen LPN   Activities of Daily  Living    05/16/2022   10:04 AM  In your present state of health, do you have any difficulty performing the following activities:  Hearing? 0  Comment has hearing aids  Vision? 0  Difficulty concentrating or making decisions? 0  Walking or climbing stairs? 1  Dressing or bathing? 0  Doing errands, shopping? 0  Preparing Food and eating ? N  Using the Toilet? N  In the past six months, have you accidently leaked urine? Y  Do you have problems with loss of bowel control? Y  Managing your Medications? N  Managing your Finances? N  Housekeeping or managing your Housekeeping? N    Patient Care Team: Lesleigh Noe, MD as PCP - General (Family Medicine)  Indicate any recent Medical Services you may have received from other than [facility] providers in the past year (date may be approximate).     Assessment:   This is a routine wellness examination for Carrie Perkins.  Hearing/Vision screen Vision Screening - Comments:: Regular eye exams, [facility]  Dietary issues and exercise activities discussed: Current Exercise Habits: The patient does not participate in regular exercise at present   Goals Addressed             This Visit's Progress    Patient Stated       05/16/2022, wants to lose weight and work on balance       Depression Screen    05/16/2022    10:04 AM 10/03/2019    4:44 PM 01/08/2014    9:00 AM  PHQ 2/9 Scores  PHQ - 2 Score 0 0 0    Fall Risk    05/16/2022   10:04 AM  [place] in the past year? 0  Number falls in past yr: 0  Injury with Fall? 0  Risk for fall due to : Medication side effect  Follow up Falls evaluation completed;Education provided;Falls prevention discussed    FALL RISK PREVENTION PERTAINING TO THE HOME:  Any stairs in or around the home? Yes  If so, are there any without handrails? No  Home free of loose throw rugs in walkways, pet beds, electrical cords, etc? Yes  Adequate lighting in your home to reduce risk of falls? Yes   ASSISTIVE DEVICES UTILIZED TO PREVENT FALLS:  Life alert? No  Use of a cane, walker or w/c? No  Grab bars in the bathroom? No  Shower chair or bench in shower? No  Elevated toilet seat or a handicapped toilet? Yes   TIMED UP AND GO:  Was the test performed? No .      Cognitive Function:        05/16/2022   10:06 AM  6CIT Screen  What Year? 0 points  What month? 0 points  What time? 0 points  Count back from 20 0 points  Months in reverse 0 points  Repeat phrase 0 points  Total Score 0 points    Immunizations Immunization History  Administered Date(s) Administered   Influenza Split 12/12/2011   Influenza Whole 08/14/2009   PFIZER Comirnaty(Gray Top)Covid-19 Tri-Sucrose Vaccine 08/28/2020   PFIZER(Purple Top)SARS-COV-2 Vaccination 12/20/2019, 01/14/2020   PNEUMOCOCCAL CONJUGATE-20 07/06/2021   Tdap 11/14/2010    TDAP status: Due, Education has been provided regarding the importance of this vaccine. Advised may receive this vaccine at local pharmacy or Health Dept. Aware to provide a copy of the vaccination record if obtained from local pharmacy or Health Dept. Verbalized acceptance and understanding.  Flu  Vaccine status: Declined, Education has been provided regarding the importance of this vaccine but patient still declined. Advised may receive  this vaccine at local pharmacy or Health Dept. Aware to provide a copy of the vaccination record if obtained from local pharmacy or Health Dept. Verbalized acceptance and understanding.  Pneumococcal vaccine status: Up to date  Covid-19 vaccine status: Completed vaccines  Qualifies for Shingles Vaccine? Yes   Zostavax completed No   Shingrix Completed?: No.    Education has been provided regarding the importance of this vaccine. Patient has been advised to call insurance company to determine out of pocket expense if they have not yet received this vaccine. Advised may also receive vaccine at local pharmacy or Health Dept. Verbalized acceptance and understanding.  Screening Tests Health Maintenance  Topic Date Due   Zoster Vaccines- Shingrix (1 of 2) Never done   COVID-19 Vaccine (4 - Pfizer series) 10/23/2020   TETANUS/TDAP  11/14/2020   INFLUENZA VACCINE  06/14/2022   MAMMOGRAM  05/09/2024   COLONOSCOPY (Pts 45-72yr Insurance coverage will need to be confirmed)  12/08/2024   Pneumonia Vaccine 71 Years old  Completed   DEXA SCAN  Completed   HPV VACCINES  Aged Out   Hepatitis C Screening  Discontinued    Health Maintenance  Health Maintenance Due  Topic Date Due   Zoster Vaccines- Shingrix (1 of 2) Never done   COVID-19 Vaccine (4 - Pfizer series) 10/23/2020   TETANUS/TDAP  11/14/2020    Colorectal cancer screening: Type of screening: Colonoscopy. Completed 12/09/2019. Repeat every 5 years  Mammogram status: Completed 05/09/2022. Repeat every year  Bone Density status: Completed 09/07/2016.   Lung Cancer Screening: (Low Dose CT Chest recommended if Age 73-80years, 30 pack-year currently smoking OR have quit w/in 15years.) does not qualify.   Lung Cancer Screening Referral: no  Additional Screening:  Hepatitis C Screening: does not qualify;   Vision Screening: Recommended annual ophthalmology exams for early detection of glaucoma and other disorders of the eye. Is the  patient up to date with their annual eye exam?  Yes  Who is the provider or what is the name of the [facility] which the patient [facility]ends [facility] exams[facility], would they like to be referred to a provider to establish care? No .   Dental Screening: Recommended annual dental exams for proper oral hygiene  Community Resource Referral / Chronic Care Management: CRR required this visit?  No   CCM required this visit?  No      Plan:     I have personally reviewed and noted the following in the patient's chart:   Medical and social history Use of alcohol, tobacco or illicit drugs  Current medications and supplements including opioid prescriptions. Patient is not currently taking opioid prescriptions. Functional ability and status Nutritional status Physical activity Advanced directives List of other physicians Hospitalizations, surgeries, and ER visits in previous 12 months Vitals Screenings to include cognitive, depression, and falls Referrals and appointments  In addition, I have reviewed and discussed with patient certain preventive protocols, quality metrics, and best practice recommendations. A written personalized care plan for preventive services as well as general preventive health recommendations were provided to patient.     NKellie Simmering LPN   71/05/16  Nurse Notes: none  Due to this being a virtual visit, the after visit summary with patients personalized plan was offered to patient via mail or  my-chart. Patient would like to access on my-chart

## 2022-08-02 ENCOUNTER — Encounter: Payer: Self-pay | Admitting: Family Medicine

## 2022-08-02 ENCOUNTER — Ambulatory Visit (INDEPENDENT_AMBULATORY_CARE_PROVIDER_SITE_OTHER): Payer: Medicare HMO | Admitting: Family Medicine

## 2022-08-02 VITALS — BP 118/68 | HR 75 | Temp 96.9°F | Ht 65.0 in | Wt 210.0 lb

## 2022-08-02 DIAGNOSIS — R7303 Prediabetes: Secondary | ICD-10-CM

## 2022-08-02 DIAGNOSIS — M8580 Other specified disorders of bone density and structure, unspecified site: Secondary | ICD-10-CM | POA: Diagnosis not present

## 2022-08-02 DIAGNOSIS — Z6836 Body mass index (BMI) 36.0-36.9, adult: Secondary | ICD-10-CM

## 2022-08-02 DIAGNOSIS — M25561 Pain in right knee: Secondary | ICD-10-CM

## 2022-08-02 DIAGNOSIS — M25562 Pain in left knee: Secondary | ICD-10-CM | POA: Diagnosis not present

## 2022-08-02 DIAGNOSIS — E6609 Other obesity due to excess calories: Secondary | ICD-10-CM | POA: Diagnosis not present

## 2022-08-02 DIAGNOSIS — E782 Mixed hyperlipidemia: Secondary | ICD-10-CM

## 2022-08-02 DIAGNOSIS — M25551 Pain in right hip: Secondary | ICD-10-CM

## 2022-08-02 DIAGNOSIS — J4540 Moderate persistent asthma, uncomplicated: Secondary | ICD-10-CM

## 2022-08-02 DIAGNOSIS — Z0000 Encounter for general adult medical examination without abnormal findings: Secondary | ICD-10-CM | POA: Diagnosis not present

## 2022-08-02 DIAGNOSIS — Z23 Encounter for immunization: Secondary | ICD-10-CM

## 2022-08-02 DIAGNOSIS — G8929 Other chronic pain: Secondary | ICD-10-CM

## 2022-08-02 LAB — CBC
HCT: 41.1 % (ref 36.0–46.0)
Hemoglobin: 14.2 g/dL (ref 12.0–15.0)
MCHC: 34.4 g/dL (ref 30.0–36.0)
MCV: 87.7 fl (ref 78.0–100.0)
Platelets: 272 10*3/uL (ref 150.0–400.0)
RBC: 4.69 Mil/uL (ref 3.87–5.11)
RDW: 13.7 % (ref 11.5–15.5)
WBC: 5.7 10*3/uL (ref 4.0–10.5)

## 2022-08-02 LAB — LIPID PANEL
Cholesterol: 201 mg/dL — ABNORMAL HIGH (ref 0–200)
HDL: 52.5 mg/dL (ref >39.00)
LDL Cholesterol: 114 mg/dL — ABNORMAL HIGH (ref 0–99)
NonHDL: 148.43
Total CHOL/HDL Ratio: 4
Triglycerides: 174 mg/dL — ABNORMAL HIGH (ref 0.0–149.0)
VLDL: 34.8 mg/dL (ref 0.0–40.0)

## 2022-08-02 LAB — HEMOGLOBIN A1C: Hgb A1c MFr Bld: 10.1 % — ABNORMAL HIGH (ref 4.6–6.5)

## 2022-08-02 LAB — COMPREHENSIVE METABOLIC PANEL
ALT: 21 U/L (ref 0–35)
AST: 14 U/L (ref 0–37)
Albumin: 3.7 g/dL (ref 3.5–5.2)
Alkaline Phosphatase: 78 U/L (ref 39–117)
BUN: 9 mg/dL (ref 6–23)
CO2: 29 mEq/L (ref 19–32)
Calcium: 9.2 mg/dL (ref 8.4–10.5)
Chloride: 101 mEq/L (ref 96–112)
Creatinine, Ser: 0.62 mg/dL (ref 0.40–1.20)
GFR: 88.63 mL/min (ref >60.00)
Glucose, Bld: 308 mg/dL — ABNORMAL HIGH (ref 70–99)
Potassium: 4.1 mEq/L (ref 3.5–5.1)
Sodium: 137 mEq/L (ref 135–145)
Total Bilirubin: 0.6 mg/dL (ref 0.2–1.2)
Total Protein: 6.1 g/dL (ref 6.0–8.3)

## 2022-08-02 LAB — VITAMIN D 25 HYDROXY (VIT D DEFICIENCY, FRACTURES): VITD: 36.13 ng/mL (ref 30.00–100.00)

## 2022-08-02 LAB — TSH: TSH: 1.73 u[IU]/mL (ref 0.35–5.50)

## 2022-08-02 NOTE — Progress Notes (Signed)
Annual Exam   Chief Complaint:  Chief Complaint  Patient presents with   Annual Exam    Not fasting    History of Present Illness:  Ms. Carrie Perkins is a 71 y.o. No obstetric history on file. who LMP was No LMP recorded. Patient is postmenopausal., presents today for her annual examination.     Nutrition She does get adequate calcium and Vitamin D in her diet. Diet: trying to eat healthy Exercise: water aerobics - due to hip arthritis    Social History   Tobacco Use  Smoking Status Never  Smokeless Tobacco Never   Social History   Substance and Sexual Activity  Alcohol Use Yes   Alcohol/week: 0.0 standard drinks of alcohol   Comment: rare-socially   Social History   Substance and Sexual Activity  Drug Use No     General Health Dentist in the last year: Yes Eye doctor: yes  Safety The patient wears seatbelts: yes.     The patient feels safe at home and in their relationships: yes.   Menstrual:  Symptoms of menopause: no  GYN She is not sexually active.   Breast Cancer Screening (Age 29-74):  There is no FH of breast cancer. There is no FH of ovarian cancer. BRCA screening Not Indicated.  Last Mammogram: 04/2022 The patient does want a mammogram this year.    Colon Cancer Screening:  Age 73-75 yo - benefits outweigh the risk. Adults 87-85 yo who have never been screened benefit.  Benefits: 134000 people in 2016 will be diagnosed and 49,000 will die - early detection helps [place]: Complications 2/2 to colonoscopy High Risk (Colonoscopy): genetic disorder (Lynch syndrome or familial adenomatous polyposis), personal hx of IBD, previous adenomatous polyp, or previous colorectal cancer, FamHx start 10 years before the age at diagnosis, increased in males and black race  Options:  FIT - looks for hemoglobin (blood in the stool) - specific and fairly sensitive - must be done annually Cologuard - looks for DNA and blood - more sensitive - therefore can have  more false positives, every 3 years Colonoscopy - every 10 years if normal - sedation, bowl prep, must have someone drive you  Shared decision making and the patient had decided to do Colonoscopy 2026.   Social History   Tobacco Use  Smoking Status Never  Smokeless Tobacco Never     Weight Wt Readings from Last 3 Encounters:  08/02/22 210 lb (95.3 kg)  05/16/22 209 lb (94.8 kg)  03/03/22 212 lb 1 oz (96.2 kg)   Patient has high BMI  BMI Readings from Last 1 Encounters:  08/02/22 34.95 kg/m     Chronic disease screening Blood pressure monitoring:  BP Readings from Last 3 Encounters:  08/02/22 118/68  03/03/22 130/80  07/06/21 112/68    Lipid Monitoring: Indication for screening: age >29, obesity, diabetes, family hx, CV risk factors.  Lipid screening: Yes  Lab Results  Component Value Date   CHOL 219 (H) 07/06/2021   HDL 48.60 07/06/2021   LDLCALC 93 04/14/2009   LDLDIRECT 140.0 07/06/2021   TRIG 257.0 (H) 07/06/2021   CHOLHDL 5 07/06/2021     Diabetes Screening: age >43, overweight, family hx, PCOS, hx of gestational diabetes, at risk ethnicity Diabetes Screening screening: Yes  Lab Results  Component Value Date   HGBA1C 6.4 01/20/2016     Past Medical History:  Diagnosis Date   Allergic rhinitis, cause unspecified    Obstructive sleep apnea (adult) (pediatric)  Osteoarthrosis, unspecified whether generalized or localized, unspecified site    Other and unspecified hyperlipidemia    Other dyspnea and respiratory abnormality    Overweight(278.02)    Unspecified asthma(493.90)    Unspecified urinary incontinence     Past Surgical History:  Procedure Laterality Date   left wrist ORIF after fall  2008   MEDIAL PARTIAL KNEE REPLACEMENT Right 05/2013   done at [facility]---Dr. [place]      Prior to Admission medications   Medication Sig Start Date End Date Taking? Authorizing Provider  albuterol (PROAIR  HFA) 108 (90 Base) MCG/ACT inhaler Inhale 1-2 puffs into the lungs every 6 (six) hours as needed for wheezing or shortness of breath. 06/29/21  Yes Parrett, Tammy S, NP  aspirin EC 81 MG tablet Take 81 mg by mouth daily.   Yes [provider]  azelastine (ASTELIN) 0.1 % nasal spray Place 1 spray into both nostrils 2 (two) times daily. Use in each nostril as directed 12/21/20  Yes Icard, Bradley L, DO  Beclomethasone Dipropionate (QNASL) 80 MCG/ACT AERS Place 2 sprays into the nose at bedtime. 12/21/20  Yes Icard, Octavio Graves, DO  budesonide-formoterol (SYMBICORT) 160-4.5 MCG/ACT inhaler Inhale 2 puffs into the lungs 2 (two) times daily. 06/29/21  Yes Parrett, Fonnie Mu, NP  cetirizine (ZYRTEC) 10 MG chewable tablet Chew 10 mg by mouth 2 (two) times daily.   Yes [provider]  Cholecalciferol 1000 UNITS capsule Take 1,000 Units by mouth daily.   Yes [provider]  dextromethorphan-guaiFENesin (MUCINEX DM) 30-600 MG per 12 hr tablet Take 2 tablets by mouth every 12 (twelve) hours.   Yes [provider]  NON FORMULARY doterra essential oil Mixes in water   Yes [provider]  Red Yeast Rice 600 MG CAPS Take 1 capsule by mouth daily.   Yes [provider]  Turmeric (QC TUMERIC COMPLEX PO) Take by mouth.   Yes [provider]  Zinc Sulfate (ZINC 15 PO) Take by mouth. Not sure of the dose   Yes [provider]    Allergies  Allergen Reactions   Amoxicillin-Pot Clavulanate     REACTION: severe abd pain---intolerant   Biaxin [Clarithromycin] Other (See Comments)    Caused severe stomach pain     Gynecologic History: No LMP recorded. Patient is postmenopausal.  Obstetric History: No obstetric history on file.  Social History   Socioeconomic History   Marital status: Divorced    Spouse name: Not on file   Number of children: 2   Years of education: college   Highest education level: Not on file  Occupational History    Occupation: [employer]  Tobacco Use   Smoking status: Never   Smokeless tobacco: Never  Vaping Use   Vaping Use: Never used  Substance and Sexual Activity   Alcohol use: Yes    Alcohol/week: 0.0 standard drinks of alcohol    Comment: rare-socially   Drug use: No   Sexual activity: Not Currently  Other Topics Concern   Not on file  Social History Narrative   10/03/19   From: [place], but moved when she was 71 yo   Living: alone   Work: Runs 2 tags/registration      Family: [place] - 1 grandchild      Enjoys: travel      Exercise: was working out 3 days a week, walks in the neighborhood  Diet: varies, a lot of salad and chicken, enjoys sweets, eating on the go         Safety   Seat belts: Yes    Guns: Yes  and secure   Safe in relationships: Yes    Social Determinants of Health   Financial Resource Strain: Low Risk  (05/16/2022)   Overall Financial Resource Strain (CARDIA)    Difficulty of Paying Living Expenses: Not hard at all  Food Insecurity: No Food Insecurity (05/16/2022)   Hunger Vital Sign    Worried About Running Out of Food in the Last Year: Never true    [place] in the Last Year: Never true  Transportation Needs: No Transportation Needs (05/16/2022)   PRAPARE - Hydrologist (Medical): No    Lack of Transportation (Non-Medical): No  Physical Activity: Inactive (05/16/2022)   Exercise Vital Sign    Days of Exercise per Week: 0 days    Minutes of Exercise per Session: 0 min  Stress: No Stress Concern Present (05/16/2022)   [place]    Feeling of Stress : Not at all  Social Connections: Not on file  Intimate Partner Violence: Not on file    Family History  Problem Relation Age of Onset   Heart disease Father    Diabetes Father    Stroke Mother 48   Heart disease Mother    Atrial fibrillation Sister    Hyperlipidemia Sister    Hypertension Sister     Alcohol abuse Brother    Atrial fibrillation Brother    Stroke Maternal Grandmother 60    Review of Systems  Constitutional:  Negative for chills and fever.  HENT:  Negative for congestion and sore throat.   Eyes:  Negative for blurred vision and double vision.  Respiratory:  Negative for shortness of breath.   Cardiovascular:  Negative for chest pain.  Gastrointestinal:  Negative for heartburn, nausea and vomiting.  Genitourinary: Negative.   Musculoskeletal: Negative.  Negative for myalgias.  Skin:  Negative for rash.  Neurological:  Negative for dizziness and headaches.  Endo/Heme/Allergies:  Does not bruise/bleed easily.  Psychiatric/Behavioral:  Negative for depression. The patient is not nervous/anxious.      Physical Exam BP 118/68   Pulse 75   Temp (!) 96.9 F (36.1 C) (Temporal)   Ht 5' 5"  (1.651 m)   Wt 210 lb (95.3 kg)   SpO2 95%   BMI 34.95 kg/m    BP Readings from Last 3 Encounters:  08/02/22 118/68  03/03/22 130/80  07/06/21 112/68      Physical Exam Constitutional:      General: She is not in acute distress.    Appearance: She is well-developed. She is not diaphoretic.  HENT:     Head: Normocephalic and atraumatic.     Right Ear: External ear normal.     Left Ear: External ear normal.     Nose: Nose normal.  Eyes:     General: No scleral icterus.    Extraocular Movements: Extraocular movements intact.     Conjunctiva/sclera: Conjunctivae normal.  Cardiovascular:     Rate and Rhythm: Normal rate and regular rhythm.     Heart sounds: No murmur heard. Pulmonary:     Effort: Pulmonary effort is normal. No respiratory distress.     Breath sounds: Normal breath sounds. No wheezing.  Abdominal:     General: Bowel sounds are normal. There is  no distension.     Palpations: Abdomen is soft. There is no mass.     Tenderness: There is no abdominal tenderness. There is no guarding or rebound.  Musculoskeletal:        General: Normal range of  motion.     Cervical back: Neck supple.  Lymphadenopathy:     Cervical: No cervical adenopathy.  Skin:    General: Skin is warm and dry.     Capillary Refill: Capillary refill takes less than 2 seconds.  Neurological:     Mental Status: She is alert and oriented to person, place, and time.     Deep Tendon Reflexes: Reflexes normal.  Psychiatric:        Mood and Affect: Mood normal.        Behavior: Behavior normal.     Results:  PHQ-9:  [facility] Office Visit from 08/02/2022 in [place] at [place]  PHQ-9 Total Score 0         Assessment: 71 y.o. No obstetric history on file. female here for routine annual physical examination.  Plan: Problem List Items Addressed This Visit       Respiratory   Asthma   Relevant Orders   CBC     Musculoskeletal and Integument   Osteopenia   Relevant Orders   Vitamin D, 25-hydroxy     Other   Hyperlipidemia   Relevant Orders   Comprehensive metabolic panel   Lipid panel   Prediabetes   Relevant Orders   Hemoglobin A1c   Class 2 obesity due to excess calories without serious comorbidity with body mass index (BMI) of 36.0 to 36.9 in adult   Relevant Orders   Amb Ref to Medical Weight Management   TSH   Knee pain   Relevant Orders   Ambulatory referral to Physical Therapy   Other Visit Diagnoses     Annual physical exam    -  Primary   Right hip pain       Relevant Orders   Ambulatory referral to Physical Therapy       Screening: -- Blood pressure screen normal -- cholesterol screening: will obtain -- Weight screening: overweight: continue to monitor -- Diabetes Screening: will obtain -- Nutrition: Encouraged healthy diet  The 10-year ASCVD risk score (Arnett DK, et al., 2019) is: 10.4%   Values used to calculate the score:     Age: 65 years     Sex: Female     Is Non-Hispanic African American: No     Diabetic: No     Tobacco smoker: No     Systolic Blood Pressure: 948 mmHg     Is BP  treated: No     HDL Cholesterol: 48.6 mg/dL     Total Cholesterol: 219 mg/dL  -- Statin therapy for Age 43-75 with CVD risk >7.5%  Psych -- Depression screening (PHQ-9):  [place] Visit from 08/02/2022 in [place] at [place]  PHQ-9 Total Score 0        Safety -- tobacco screening: not using -- alcohol screening:  low-risk usage. -- no evidence of domestic violence or intimate partner violence.   Cancer Screening -- pap smear not collected per ASCCP guidelines -- family history of breast cancer screening: done. not at high risk. -- Mammogram -  up to date -- Colon cancer (age 24+)--  up to date  Immunizations Immunization History  Administered Date(s) Administered   Influenza Split 12/12/2011   Influenza Whole 08/14/2009  PFIZER Comirnaty(Gray Top)Covid-19 Tri-Sucrose Vaccine 08/28/2020   PFIZER(Purple Top)SARS-COV-2 Vaccination 12/20/2019, 01/14/2020   PNEUMOCOCCAL CONJUGATE-20 07/06/2021   Pneumococcal Polysaccharide-23 08/02/2022   Tdap 11/14/2010    -- flu vaccine - declined -- TDAP q10 years not up to date - advised getting -- Shingles (age >13) not up to date - advised getting -- PPSV-23 (19-64 with chronic disease or smoking) up to date -- PCV-13 (age >61) - one dose followed by PPSV-23 1 year later up to date -- Covid-19 Vaccine up to date   Encouraged healthy diet and exercise. Encouraged regular vision and dental care.    Lesleigh Noe, MD

## 2022-08-02 NOTE — Patient Instructions (Signed)
#  Referral I have placed a referral to a specialist for you. You should receive a phone call from the specialty office. Make sure your voicemail is not full and that if you are able to answer your phone to unknown or new numbers.   It may take up to 2 weeks to hear about the referral. If you do not hear anything in 2 weeks, please call our office and ask to speak with the referral coordinator.  - healthy weight and wellness - physical therapy

## 2022-08-18 ENCOUNTER — Ambulatory Visit (INDEPENDENT_AMBULATORY_CARE_PROVIDER_SITE_OTHER): Payer: Medicare HMO | Admitting: Family Medicine

## 2022-08-18 VITALS — BP 118/62 | HR 93 | Temp 97.2°F | Ht 65.0 in | Wt 209.4 lb

## 2022-08-18 DIAGNOSIS — E1169 Type 2 diabetes mellitus with other specified complication: Secondary | ICD-10-CM | POA: Insufficient documentation

## 2022-08-18 LAB — MICROALBUMIN / CREATININE URINE RATIO
Creatinine,U: 56.4 mg/dL
Microalb Creat Ratio: 1.2 mg/g (ref 0.0–30.0)
Microalb, Ur: <0.7 mg/dL (ref 0.0–1.9)

## 2022-08-18 MED ORDER — METFORMIN HCL 500 MG PO TABS: 1000.0000 mg | ORAL_TABLET | Freq: Two times a day (BID) | ORAL | 0 refills | Status: DC

## 2022-08-18 MED ORDER — SEMAGLUTIDE(0.25 OR 0.5MG/DOS) 2 MG/3ML ~~LOC~~ SOPN: 0.2500 mg | PEN_INJECTOR | SUBCUTANEOUS | 0 refills | Status: DC

## 2022-08-18 NOTE — Assessment & Plan Note (Signed)
New diagnosis to patient.  Start metformin 500 mg daily, with up titration to 1000 mg twice daily with food.  She is also obese we will start Ozempic 0.25 mg weekly.  Referral to nutrition for diabetes education as well as to pharmacy for additional support.  Discussed importance of medication, she is due for her eye exam she will schedule this.  Normal foot exam.  Urine today.

## 2022-08-18 NOTE — Progress Notes (Signed)
Subjective:     Carrie Perkins is a 71 y.o. female presenting for Follow-up (Labs )     HPI  Diabetes - urinary frequency - father with DM was told she was borderline in the past - has an eye doctor at [facility] to watch carbs Has not done well on sugars lately Not interested in fingersticks regularly  Does not want insulin  No increased urination or tingling or numbness in the feet  Review of Systems   Social History   Tobacco Use  Smoking Status Never  Smokeless Tobacco Never        Objective:    BP Readings from Last 3 Encounters:  08/18/22 118/62  08/02/22 118/68  03/03/22 130/80   Wt Readings from Last 3 Encounters:  08/18/22 209 lb 6 oz (95 kg)  08/02/22 210 lb (95.3 kg)  05/16/22 209 lb (94.8 kg)    BP 118/62   Pulse 93   Temp (!) 97.2 F (36.2 C) (Temporal)   Ht '5\' 5"'$  (1.651 m)   Wt 209 lb 6 oz (95 kg)   SpO2 96%   BMI 34.84 kg/m    Physical Exam Constitutional:      General: She is not in acute distress.    Appearance: She is well-developed. She is not diaphoretic.  HENT:     Right Ear: External ear normal.     Left Ear: External ear normal.     Nose: Nose normal.  Eyes:     Conjunctiva/sclera: Conjunctivae normal.  Cardiovascular:     Rate and Rhythm: Normal rate and regular rhythm.  Pulmonary:     Effort: Pulmonary effort is normal. No respiratory distress.     Breath sounds: Normal breath sounds. No wheezing.  Musculoskeletal:     Cervical back: Neck supple.  Skin:    General: Skin is warm and dry.     Capillary Refill: Capillary refill takes less than 2 seconds.  Neurological:     Mental Status: She is alert. Mental status is at baseline.  Psychiatric:        Mood and Affect: Mood normal.        Behavior: Behavior normal.    Diabetic Foot Exam - Simple   Simple Foot Form Diabetic Foot exam was performed with the following findings: Yes 08/18/2022  8:50 AM  Visual Inspection No deformities, no ulcerations, no  other skin breakdown bilaterally: Yes Sensation Testing Intact to touch and monofilament testing bilaterally: Yes Pulse Check Posterior Tibialis and Dorsalis pulse intact bilaterally: Yes Comments           Assessment & Plan:   Problem List Items Addressed This Visit       Endocrine   Type 2 diabetes mellitus with other specified complication ([place]) - Primary    New diagnosis to patient.  Start metformin 500 mg daily, with up titration to 1000 mg twice daily with food.  She is also obese we will start Ozempic 0.25 mg weekly.  Referral to nutrition for diabetes education as well as to pharmacy for additional support.  Discussed importance of medication, she is due for her eye exam she will schedule this.  Normal foot exam.  Urine today.      Relevant Medications   metFORMIN (GLUCOPHAGE) 500 MG tablet   Semaglutide,0.25 or 0.'5MG'$ /DOS, 2 MG/3ML SOPN   Other Relevant Orders   Ambulatory referral to Nutrition and Diabetic Education   Microalbumin / creatinine urine ratio  Other   Morbid obesity ([place])    C/b diabetes. Already referred to healthy weight and wellness. Attempt to get ozempic covered. Nutrition referral.       Relevant Medications   metFORMIN (GLUCOPHAGE) 500 MG tablet   Semaglutide,0.25 or 0.'5MG'$ /DOS, 2 MG/3ML SOPN     Return in about 3 months (around 11/18/2022) for [facility] with Dr. Diona Browner - DM f/u .  Lesleigh Noe, MD

## 2022-08-18 NOTE — Assessment & Plan Note (Signed)
C/b diabetes. Already referred to healthy weight and wellness. Attempt to get ozempic covered. Nutrition referral.

## 2022-08-18 NOTE — Patient Instructions (Addendum)
Your hemoglobin A1c was elevated and shows that you have diabetes.   Diabetes is treated with diet and medication- avoid sugar (especially sugary beverages like soda and sweet tea), carbohydrates - like baked goods and bread. Try to eat lean protein (fish and chicken) and lots of green veggies. You can go to Diabetes.org for more information on dietary changes and call the clinic and I can place a referral to see a diabetes nutritionist.   I would like you to start a medication call Metformin.   I am prescribing a 500 mg tablet. The most common side effect is stomach upset (nausea and diarrhea). Below is a 4 week plan to increase. If you are experiencing side effects, do not move on to the next week unless your symptoms are better.   Week 1: Take 500 mg in the morning with food Week 2: Take 500 mg in the morning and the evening with food Week 3: Take 1000 mg (2 tablets) in the morning and 500 mg in the evening with food Week 4: Take 1000 mg (2 tablets) in the morning and evening with food  Return in 3 months for an appointment.   Referrals  - Nutritionist - pharmacist - Ria Comment will call

## 2022-08-24 NOTE — Progress Notes (Signed)
Contacted the patient and scheduled phone call with the pharmacist to discuss diabetes medications and options for treatment  Charlene Brooke, CPP notified  Avel Sensor, [place]  [phone]

## 2022-08-25 ENCOUNTER — Other Ambulatory Visit: Payer: Self-pay | Admitting: Adult Health

## 2022-09-07 ENCOUNTER — Telehealth: Payer: Self-pay

## 2022-09-07 NOTE — Chronic Care Management (AMB) (Signed)
Chronic Care Management Pharmacy Assistant   Name: Carrie Perkins  MRN: [id number] DOB: [birth year]  Carrie Perkins is an 71 y.o. year old female who presents for his initial  visit with the clinical pharmacist.    Conditions to be addressed/monitored: HLD and DMII   Recent office visits:  08/18/22-Jessica Cody,MD(PCP)-DM,    New diagnosis to patient.  Start metformin 500 mg daily, with up titration to 1000 mg twice daily with food.  She is also obese we will start Ozempic 0.25 mg weekly.  Referral to nutrition for diabetes education as well as to pharmacy for additional support.  Discussed importance of medication, she is due for her eye exam she will schedule this.  Normal foot exam.  Urine today,(normal) f/u 3 months     08/02/22-Jessica Cody,MD(PCP)-[facility],discussed screenings,referral for medical weight management,referral to PT,discussed vaccines, Labs ( severe diabetes A1c 10.1 )no medication changes f/u 6 months  Recent consult visits:  None in 6 months   Hospital visits:  None in previous 6 months  Medications: Outpatient Encounter Medications as of 09/07/2022  Medication Sig   albuterol (PROAIR HFA) 108 (90 Base) MCG/ACT inhaler Inhale 1-2 puffs into the lungs every 6 (six) hours as needed for wheezing or shortness of breath.   aspirin EC 81 MG tablet Take 81 mg by mouth daily.   azelastine (ASTELIN) 0.1 % nasal spray Place 1 spray into both nostrils 2 (two) times daily. Use in each nostril as directed   Beclomethasone Dipropionate (QNASL) 80 MCG/ACT AERS Place 2 sprays into the nose at bedtime.   budesonide-formoterol (SYMBICORT) 160-4.5 MCG/ACT inhaler Inhale 2 puffs into the lungs 2 (two) times daily.   cetirizine (ZYRTEC) 10 MG chewable tablet Chew 10 mg by mouth 2 (two) times daily.   Cholecalciferol 1000 UNITS capsule Take 1,000 Units by mouth daily.   dextromethorphan-guaiFENesin (MUCINEX DM) 30-600 MG per 12 hr tablet Take 2 tablets by mouth every 12 (twelve) hours.    metFORMIN (GLUCOPHAGE) 500 MG tablet Take 2 tablets (1,000 mg total) by mouth 2 (two) times daily with a meal.   NON FORMULARY doterra essential oil Mixes in water   Red Yeast Rice 600 MG CAPS Take 1 capsule by mouth daily.   Semaglutide,0.25 or 0.'5MG'$ /DOS, 2 MG/3ML SOPN Inject 0.25 mg into the skin once a week.   Turmeric (QC TUMERIC COMPLEX PO) Take by mouth.   Zinc Sulfate (ZINC 15 PO) Take by mouth. Not sure of the dose   No facility-administered encounter medications on file as of 09/07/2022.     Lab Results  Component Value Date/Time   HGBA1C 10.1 (H) 08/02/2022 08:42 AM   HGBA1C 6.4 01/20/2016 10:00 AM   MICROALBUR <0.7 08/18/2022 09:16 AM     BP Readings from Last 3 Encounters:  08/18/22 118/62  08/02/22 118/68  03/03/22 130/80     Patient contacted to confirm telephone appointment with Charlene Brooke, Pharm D, on 09/12/22 at 9:00.  Left message regarding date and time   Do you have any problems getting your medications? No- [facility]   What is your top health concern you would like to discuss at your upcoming visit? Diabetic medications available.  Have you seen any other providers since your last visit with PCP? No   CCM referral has been placed prior to visit?  No   Star Rating Drugs:  Medication:  Last Fill: Day Supply Metformin '500mg'$  08/18/22 90 Ozempic  08/18/22 56   Care Gaps: Annual wellness visit  in last year? Yes Most Recent BP reading:118/68  If Diabetic: Most recent A1C reading:10.1 Last eye exam / retinopathy screening:never done (has an eye doctor at [facility]) Last diabetic foot exam:UTD  Charlene Brooke, CPP notified  Avel Sensor, [place]  [phone]

## 2022-09-12 ENCOUNTER — Telehealth: Payer: Medicare HMO

## 2022-09-12 ENCOUNTER — Ambulatory Visit: Payer: Medicare HMO | Admitting: Pharmacist

## 2022-09-12 DIAGNOSIS — E1169 Type 2 diabetes mellitus with other specified complication: Secondary | ICD-10-CM

## 2022-09-12 DIAGNOSIS — E782 Mixed hyperlipidemia: Secondary | ICD-10-CM

## 2022-09-12 MED ORDER — MICROLET LANCETS MISC: 3 refills | Status: AC

## 2022-09-12 MED ORDER — BLOOD GLUCOSE TEST VI STRP: ORAL_STRIP | 3 refills | Status: AC

## 2022-09-12 MED ORDER — BLOOD GLUCOSE METER KIT: PACK | 0 refills | Status: AC

## 2022-09-12 MED ORDER — SEMAGLUTIDE(0.25 OR 0.5MG/DOS) 2 MG/3ML ~~LOC~~ SOPN: 0.5000 mg | PEN_INJECTOR | SUBCUTANEOUS | 0 refills | Status: DC

## 2022-09-12 NOTE — Patient Instructions (Addendum)
Visit Information  Phone number for Pharmacist: [phone]   Goals Addressed   None     Care Plan : Pharmacy Care Plan  Updates made by Charlton Haws, [place] since 09/12/2022 12:00 AM     Problem: Hyperlipidemia and Diabetes   Priority: High     Long-Range Goal: Disease mgmt   Start Date: 09/12/2022  Expected End Date: 09/13/2023  This Visit's Progress: On track  Priority: High  Note:   Current Barriers:  Unable to independently monitor therapeutic efficacy Suboptimal therapeutic regimen for DM  Pharmacist Clinical Goal(s):  Patient will achieve adherence to monitoring guidelines and medication adherence to achieve therapeutic efficacy adhere to plan to optimize therapeutic regimen for DM as evidenced by report of adherence to recommended medication management changes through collaboration with PharmD and provider.   Interventions: 1:1 collaboration with PCP regarding development and update of comprehensive plan of care as evidenced by provider attestation and co-signature Inter-disciplinary care team collaboration (see longitudinal plan of care) Comprehensive medication review performed; medication list updated in electronic medical record  Hyperlipidemia: (LDL goal < 70) -Uncontrolled - LDL 114 (07/2022) -Current treatment: Red yeast rice - Query appropriate Aspirin 81 mg daily - Appropriate, Effective, Safe, Accessible -Medications previously tried: simvastatin  -Educated on Cholesterol goals; Benefits of statin for ASCVD risk reduction; role of statin therapy in someone with diabetes -Recommend to start statin therapy given recent DM diagnosis - pt declined  Diabetes (A1c goal <7%) -Uncontrolled - A1c 10.1% (07/2022); pt recently started metformin and Ozempic; she stopped metformin shortly after due to GI effects, she did start with 3 tablets daily instead of titrating up from 1 tablet daily; she reports after stopping metformin GI issues resolved -Meal  patterns: pt eats "on the go" a lot, she rarely has time to prepare foods; she reports appetite is reduced since starting Ozempic -Current home glucose readings: pt does not have testing supplies; she is willing to test -Current medications: Metformin 500 mg - 2 tab BID - stopped taking Ozempic 0.25 mg weekly - Appropriate, Effective, Safe, Accessible -Medications previously tried: none  -Educated on A1c and blood sugar goals;Benefits of routine self-monitoring of blood sugar; CGM - not covered by her insurance -Reviewed benefits of metformin and importance of titrating slowly from low dose - pt declined to retry metformin at this time -Reviewed Ozempic titration: she has completed 3 weeks of injections with 0.25 mg dose, discussed plan to increase to 0.5 mg weekly on week 5 -Consider restarting metformin XR in future at low dose -Ordered glucose testing supplies and advised pt to check fasting BG daily while we are adjusting medications; pt reports her son can show her how to use testing supplies, advised to call PharmD if any additional assistance is needed  Health Maintenance -Vaccine gaps: Shingrix, TDAP -Current therapy:  Mucinex DM Vitamin D 1000 IU Turmeric Zinc Astelin nasal spray Cetirizine 10 mg Patient Goals/Self-Care Activities Patient will:  - take medications as prescribed as evidenced by patient report and record review focus on medication adherence by routine check glucose daily, document, and provide at future appointments       Patient verbalizes understanding of instructions and care plan provided today and agrees to view in [place]. Active MyChart status and patient understanding of how to access instructions and care plan via MyChart confirmed with patient.    Telephone follow up appointment with pharmacy team member scheduled for: 1 month  Charlene Brooke, PharmD, BCACP Clinical Pharmacist [facility]  [place] [phone]

## 2022-09-12 NOTE — Progress Notes (Signed)
Chronic Care Management Pharmacy Note  09/12/2022 Name:  Carrie Perkins MRN:  [id number] DOB:  May 05, 1951  Summary: Initial visit -DM: A1c 10.1% (07/2022) - new diagnosis. Pt has started Ozempic 0.25 mg and metformin, but she stopped metformin after a few days due to GI side effects (she was taking 1500 mg/day, she did not titrate up slowly from 500 mg); reviewed benefits of metformin and encouraged restarted at a low dose, pt declined -Pt does not have glucose testing supplies, interested in CGM but she will not qualify -HLD: LDL 114 (07/2022), she is not on a statin; reviewed importance of statin for heart disease prevention now that she has diabetes  Recommendations/Changes made from today's visit: -Increase Ozempic to 0.5 mg after 4 weeks on 0.25 mg per manufacturer instructions - refilled Rx  -Ordered glucometer and supplies; advised to check fasting BG daily while adjusting medications -Recommended to restart metformin at low dose, pt declined -Recommended to start statin, pt declined  Plan: -Pharmacist follow up televisit scheduled for 1 month -PCP appt 12/13/22 (TOC - Dr Diona Browner)    Subjective: Carrie Perkins is an 71 y.o. year old female who is a primary patient of Waunita Schooner, MD.  The CCM team was consulted for assistance with disease management and care coordination needs.    Engaged with patient by telephone for initial visit in response to provider referral for pharmacy case management and/or care coordination services.   Consent to Services:  The patient was given the following information about Chronic Care Management services today, agreed to services, and gave verbal consent: 1. CCM service includes personalized support from designated clinical staff supervised by the primary care provider, including individualized plan of care and coordination with other care providers 2. 24/7 contact phone numbers for assistance for urgent and routine care needs. 3. Service will only  be billed when office clinical staff spend 20 minutes or more in a month to coordinate care. 4. Only one practitioner may furnish and bill the service in a calendar month. 5.The patient may stop CCM services at any time (effective at the end of the month) by phone call to the office staff. 6. The patient will be responsible for cost sharing (co-pay) of up to 20% of the service fee (after annual deductible is met). Patient agreed to services and consent obtained.  Patient Care Team: Waunita Schooner, MD as PCP - General (Family Medicine) Charlton Haws, [facility] as Pharmacist (Pharmacist)  Recent office visits: 08/18/22 Dr Einar Pheasant OV: DM new dx (A1c 10.1); start metformin 500 mg with up-titration; start Ozempic 0.25 mg weekly; schedule eye exam; referred to nutrition  08/02/22 Dr Einar Pheasant OV: annual - referred to weight mgmt.  Recent consult visits: 12/06/21 Dr Sharlet Salina (Cardiology): initial eval - palpitations. Counseled on monitoring EKG w/ smartphone/watch. All showed normal HR.   Hospital visits: None in previous 6 months   Objective:  Lab Results  Component Value Date   CREATININE 0.62 08/02/2022   BUN 9 08/02/2022   GFR 88.63 08/02/2022   GFRNONAA 102.19 05/11/2010   NA 137 08/02/2022   K 4.1 08/02/2022   CALCIUM 9.2 08/02/2022   CO2 29 08/02/2022   GLUCOSE 308 (H) 08/02/2022    Lab Results  Component Value Date/Time   HGBA1C 10.1 (H) 08/02/2022 08:42 AM   HGBA1C 6.4 01/20/2016 10:00 AM   GFR 88.63 08/02/2022 08:42 AM   GFR 90.37 07/06/2021 09:24 AM   MICROALBUR <0.7 08/18/2022 09:16 AM    Last diabetic  Eye exam: No results found for: "HMDIABEYEEXA"  Last diabetic Foot exam: No results found for: "HMDIABFOOTEX"   Lab Results  Component Value Date   CHOL 201 (H) 08/02/2022   HDL 52.50 08/02/2022   LDLCALC 114 (H) 08/02/2022   LDLDIRECT 140.0 07/06/2021   TRIG 174.0 (H) 08/02/2022   CHOLHDL 4 08/02/2022       Latest Ref Rng & Units 08/02/2022    8:42 AM 07/06/2021     9:24 AM 01/20/2016   10:00 AM  Hepatic Function  Total Protein 6.0 - 8.3 g/dL 6.1  6.6  6.9   Albumin 3.5 - 5.2 g/dL 3.7  4.0  4.4   AST 0 - 37 U/L _0 ALT 0 - 35 U/L _1 Alk Phosphatase 39 - 117 U/L 78  71  61   Total Bilirubin 0.2 - 1.2 mg/dL 0.6  0.7  0.6   Bilirubin, Direct 0.0 - 0.3 mg/dL   0.1     Lab Results  Component Value Date/Time   TSH 1.73 08/02/2022 08:42 AM   TSH 0.93 01/20/2016 10:00 AM       Latest Ref Rng & Units 08/02/2022    8:42 AM 01/20/2016   10:00 AM 01/08/2014   10:14 AM  CBC  WBC 4.0 - 10.5 K/uL 5.7  6.5  7.5   Hemoglobin 12.0 - 15.0 g/dL 14.2  14.8  14.6   Hematocrit 36.0 - 46.0 % 41.1  42.2  43.6   Platelets 150.0 - 400.0 K/uL 272.0  284.0  316.0     Lab Results  Component Value Date/Time   VD25OH 36.13 08/02/2022 08:42 AM   VD25OH 14.79 (L) 01/20/2016 10:00 AM    Clinical ASCVD: No  The 10-year ASCVD risk score (Arnett DK, et al., 2019) is: 20.7%   Values used to calculate the score:     Age: 41 years     Sex: Female     Is Non-Hispanic African American: No     Diabetic: Yes     Tobacco smoker: No     Systolic Blood Pressure: 157 mmHg     Is BP treated: No     HDL Cholesterol: 52.5 mg/dL     Total Cholesterol: 201 mg/dL       08/02/2022    7:55 AM 05/16/2022   10:04 AM 10/03/2019    4:44 PM  Depression screen PHQ 2/9  Decreased Interest 0 0 0  Down, Depressed, Hopeless 0 0 0  PHQ - 2 Score 0 0 0  Altered sleeping 0    Tired, decreased energy 0    Change in appetite 0    Feeling bad or failure about yourself  0    Trouble concentrating 0    Moving slowly or fidgety/restless 0    Suicidal thoughts 0    PHQ-9 Score 0    Difficult doing work/chores Not difficult at all      Social History   Tobacco Use  Smoking Status Never  Smokeless Tobacco Never   BP Readings from Last 3 Encounters:  08/18/22 118/62  08/02/22 118/68  03/03/22 130/80   Pulse Readings from Last 3 Encounters:  08/18/22 93  08/02/22 75   03/03/22 92   Wt Readings from Last 3 Encounters:  08/18/22 209 lb 6 oz (95 kg)  08/02/22 210 lb (95.3 kg)  05/16/22 209 lb (94.8 kg)   BMI Readings from Last 3 Encounters:  08/18/22 34.84 kg/m  08/02/22 34.95 kg/m  05/16/22 33.73 kg/m    Assessment/Interventions: Review of patient past medical history, allergies, medications, health status, including review of consultants reports, laboratory and other test data, was performed as part of comprehensive evaluation and provision of chronic care management services.   SDOH:  (Social Determinants of Health) assessments and interventions performed: Yes SDOH Interventions    Flowsheet Row Chronic Care Management from 09/12/2022 in [place] [facility] [place] Interventions   Transportation Interventions Intervention Not Indicated  Financial Strain Interventions Intervention Not Indicated      [place]: No Food Insecurity (05/16/2022)  Transportation Needs: No Transportation Needs (09/12/2022)  Depression (PHQ2-9): Low Risk  (08/02/2022)  Financial Resource Strain: Low Risk  (09/12/2022)  Physical Activity: Inactive (05/16/2022)  Stress: No Stress Concern Present (05/16/2022)  Tobacco Use: Low Risk  (08/02/2022)    CCM Care Plan  Allergies  Allergen Reactions   Amoxicillin-Pot Clavulanate     REACTION: severe abd pain---intolerant   Biaxin [Clarithromycin] Other (See Comments)    Caused severe stomach pain     Medications Reviewed Today     Reviewed by Charlton Haws, [facility] (Pharmacist) on 09/12/22 at [place] List Status: <None>   Medication Order Taking? Sig Documenting Provider Last Dose Status Informant  albuterol (PROAIR HFA) 108 (90 Base) MCG/ACT inhaler [id number] Yes Inhale 1-2 puffs into the lungs every 6 (six) hours as needed for wheezing or shortness of breath. Parrett, Fonnie Mu, NP Taking Active   aspirin EC 81 MG tablet [id number] Yes Take 81 mg by mouth daily. [provider] Taking Active   azelastine (ASTELIN) 0.1 % nasal spray [id number] Yes Place 1 spray into both nostrils 2 (two) times daily. Use in each nostril as directed Icard, Bradley L, DO Taking Active   Beclomethasone Dipropionate (QNASL) 80 MCG/ACT AERS [id number] Yes Place 2 sprays into the nose at bedtime. Garner Nash, DO Taking Active   blood glucose meter kit and supplies [id number]  Dispense based on patient and insurance preference. Use up to four times daily as directed. (FOR ICD-10 E11.9). Jinny Sanders, MD  Active   budesonide-formoterol [facility]) 160-4.5 MCG/ACT inhaler [id number] Yes Inhale 2 puffs into the lungs 2 (two) times daily. Parrett, Fonnie Mu, NP Taking Active   cetirizine (ZYRTEC) 10 MG chewable tablet [id number] Yes Chew 10 mg by mouth 2 (two) times daily. [provider] Taking Active   Cholecalciferol 1000 UNITS capsule [id number] Yes Take 1,000 Units by mouth daily. [provider] Taking Active   dextromethorphan-guaiFENesin [facility] DM) 30-600 MG per 12 hr tablet [id number] Yes Take 2 tablets by mouth every 12 (twelve) hours. [provider] Taking Active   Glucose Blood (BLOOD GLUCOSE TEST STRIPS) STRP [id number]  Test blood sugar as directed. (FOR ICD-10 E11.9). Jinny Sanders, MD  Active   Microlet Lancets MISC [id number]  Use to test blood sugar as directed. (FOR ICD-10 E11.9). Jinny Sanders, MD  Active   Baruch Gouty [id number] Yes doterra essential oil Mixes in water [provider] Taking Active   Red Yeast Rice 600 MG CAPS [id number] Yes Take 1 capsule by mouth daily. [provider] Taking Active   Semaglutide,0.25 or 0.5MG/DOS, 2 MG/3ML SOPN [id number] Yes Inject 0.5 mg into the skin once a week. Jinny Sanders, MD Taking Active   Turmeric (QC TUMERIC COMPLEX PO) [id number] Yes Take by mouth. [provider] Taking Active  Zinc Sulfate (ZINC 15 PO) [id number] Yes Take by mouth. Not sure of the dose [provider] Taking Active             Patient Active Problem List   Diagnosis Date Noted   Type 2 diabetes mellitus with other specified complication ([place]) 75/91/6384   LVH (left ventricular hypertrophy) 07/06/2021   Visual changes 03/12/2021   Benign paroxysmal positional vertigo 09/10/2020   History of adenomatous polyp of colon 12/14/2019   Diverticulosis 12/09/2019   Osteopenia 10/07/2019   Morbid obesity ([place]) 10/03/2019   Elevated blood pressure reading 10/03/2019   Chronic fatigue 09/05/2016   Impaired fasting glucose 66/59/9357   Diastolic dysfunction 01/77/9390   Abnormal EKG 03/24/2014   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 04/11/2013   Knee pain 09/03/2012   Osteoarthritis 04/15/2009   URINARY INCONTINENCE 04/15/2009   Hyperlipidemia 04/14/2009   Allergic rhinitis 04/14/2009   GASTROESOPHAGEAL REFLUX DISEASE 04/14/2009   Obstructive sleep apnea 10/23/2007   Asthma 10/15/2007   SNORING 10/15/2007    Immunization History  Administered Date(s) Administered   Influenza Split 12/12/2011   Influenza Whole 08/14/2009   PFIZER Comirnaty(Gray Top)Covid-19 Tri-Sucrose Vaccine 08/28/2020   PFIZER(Purple Top)SARS-COV-2 Vaccination 12/20/2019, 01/14/2020   PNEUMOCOCCAL CONJUGATE-20 07/06/2021   Pneumococcal Polysaccharide-23 08/02/2022   Tdap 11/14/2010    Conditions to be addressed/monitored:  Hyperlipidemia and Diabetes  Care Plan : Pharmacy Care Plan  Updates made by Charlton Haws, [place] since 09/12/2022 12:00 AM     Problem: Hyperlipidemia and Diabetes   Priority: High     Long-Range Goal: Disease mgmt   Start Date: 09/12/2022  Expected End Date: 09/13/2023  This Visit's Progress: On track  Priority: High  Note:   Current Barriers:  Unable to independently monitor therapeutic efficacy Suboptimal therapeutic regimen for DM  Pharmacist Clinical Goal(s):  Patient will achieve adherence to monitoring guidelines and medication adherence to  achieve therapeutic efficacy adhere to plan to optimize therapeutic regimen for DM as evidenced by report of adherence to recommended medication management changes through collaboration with PharmD and provider.   Interventions: 1:1 collaboration with PCP regarding development and update of comprehensive plan of care as evidenced by provider attestation and co-signature Inter-disciplinary care team collaboration (see longitudinal plan of care) Comprehensive medication review performed; medication list updated in electronic medical record  Hyperlipidemia: (LDL goal < 70) -Uncontrolled - LDL 114 (07/2022) -Current treatment: Red yeast rice - Query appropriate Aspirin 81 mg daily - Appropriate, Effective, Safe, Accessible -Medications previously tried: simvastatin  -Educated on Cholesterol goals; Benefits of statin for ASCVD risk reduction; role of statin therapy in someone with diabetes -Recommend to start statin therapy given recent DM diagnosis - pt declined  Diabetes (A1c goal <7%) -Uncontrolled - A1c 10.1% (07/2022); pt recently started metformin and Ozempic; she stopped metformin shortly after due to GI effects, she did start with 3 tablets daily instead of titrating up from 1 tablet daily; she reports after stopping metformin GI issues resolved -Meal patterns: pt eats "on the go" a lot, she rarely has time to prepare foods; she reports appetite is reduced since starting Ozempic -Current home glucose readings: pt does not have testing supplies; she is willing to test -Current medications: Metformin 500 mg - 2 tab BID - stopped taking Ozempic 0.25 mg weekly - Appropriate, Effective, Safe, Accessible -Medications previously tried: none  -Educated on A1c and blood sugar goals;Benefits of routine self-monitoring of blood sugar; CGM - not covered by her insurance -Reviewed benefits of  metformin and importance of titrating slowly from low dose - pt declined to retry metformin at this  time -Reviewed Ozempic titration: she has completed 3 weeks of injections with 0.25 mg dose, discussed plan to increase to 0.5 mg weekly on week 5 -Consider restarting metformin XR in future at low dose -Ordered glucose testing supplies and advised pt to check fasting BG daily while we are adjusting medications; pt reports her son can show her how to use testing supplies, advised to call PharmD if any additional assistance is needed  Health Maintenance -Vaccine gaps: Shingrix, TDAP -Current therapy:  Mucinex DM Vitamin D 1000 IU Turmeric Zinc Astelin nasal spray Cetirizine 10 mg Patient Goals/Self-Care Activities Patient will:  - take medications as prescribed as evidenced by patient report and record review focus on medication adherence by routine check glucose daily, document, and provide at future appointments       Medication Assistance: None required.  Patient affirms current coverage meets needs.  Compliance/Adherence/Medication fill history: Care Gaps: Eye exam  Star-Rating Drugs: None  Medication Access: Within the past 30 days, how often has patient missed a dose of medication? 0 Is a pillbox or other method used to improve adherence? Yes  Factors that may affect medication adherence? adverse effects of medications and lack of understanding of disease management Are meds synced by current pharmacy? No  Are meds delivered by current pharmacy? No  Does patient experience delays in picking up medications due to transportation concerns? No   [facility] Services Reviewed: Is patient disadvantaged to use [facility]?: No  Current Rx insurance plan: [employer] Name and location of Current pharmacy:  [id number] [place] [id number]: [phone]: [phone] [facility] services reviewed with patient today?: No  Patient requests to transfer care to [facility]?: No  Reason  patient declined to change pharmacies: Not mentioned at this visit   Care Plan and Follow Up Patient Decision:  Patient agrees to Care Plan and Follow-up.  Plan: Telephone follow up appointment with care management team member scheduled for:  1 month  LCharlene Brooke PharmD, BNicholas H Noyes Memorial HospitalClinical Pharmacist [phone]

## 2022-09-13 ENCOUNTER — Encounter (INDEPENDENT_AMBULATORY_CARE_PROVIDER_SITE_OTHER): Payer: Self-pay

## 2022-09-20 ENCOUNTER — Ambulatory Visit: Payer: Medicare HMO | Admitting: Nutrition

## 2022-10-10 ENCOUNTER — Other Ambulatory Visit: Payer: Self-pay | Admitting: Adult Health

## 2022-10-10 ENCOUNTER — Telehealth: Payer: Self-pay | Admitting: Adult Health

## 2022-10-10 MED ORDER — QNASL 80 MCG/ACT NA AERS: 2.0000 | INHALATION_SPRAY | Freq: Every day | NASAL | 2 refills | Status: DC

## 2022-10-10 NOTE — Telephone Encounter (Signed)
Can double book tomorrow in [place] (she lives in [place]) .

## 2022-10-10 NOTE — Telephone Encounter (Signed)
Patient states she thinks she has an upper respiratory infection and needs to see Tammy- TP's first opening is 12/6 and patient is leaving to go out of town then. Patient denied seeing anyone else because they would not know her history. She has sore throat, congestion and head pressure. Please advise.

## 2022-10-10 NOTE — Telephone Encounter (Signed)
Called and spoke with pt letting her know the info per TP and she verbalized understanding. Pt has been scheduled for an appt with TP tomorrow. Nothing further needed.

## 2022-10-10 NOTE — Telephone Encounter (Signed)
Called and spoke with patient. She stated that she woke up with a sore throat and fatigue on 11/23. She took a covid test and it was negative. Over the next few days, she developed a headache due to the head congestion. She does not have a runny nose but she does have productive cough. Phlegm appeared to be slightly yellow. Denied any fevers or body aches. She took another covid test over the weekend and it was negative as well.   She has been taking Mucinex Fast Max for congestion and sore throat and the sore throat is almost gone. She is still using the Symbicort 2 puffs twice daily.   While on the phone, she requested a refill on her QNasl, this has been sent in.   She wanted an appt to see TP today but she is aware that we do not have any openings. She has requested to only see TP.   [facility] [place]   TP, can you please advise? Thanks!

## 2022-10-11 ENCOUNTER — Encounter: Payer: Self-pay | Admitting: Adult Health

## 2022-10-11 ENCOUNTER — Ambulatory Visit: Payer: Medicare HMO | Admitting: Adult Health

## 2022-10-11 VITALS — BP 124/70 | HR 83 | Temp 98.3°F | Ht 66.0 in | Wt 208.8 lb

## 2022-10-11 DIAGNOSIS — E1169 Type 2 diabetes mellitus with other specified complication: Secondary | ICD-10-CM

## 2022-10-11 DIAGNOSIS — J4541 Moderate persistent asthma with (acute) exacerbation: Secondary | ICD-10-CM | POA: Diagnosis not present

## 2022-10-11 MED ORDER — BENZONATATE 200 MG PO CAPS: 200.0000 mg | ORAL_CAPSULE | Freq: Three times a day (TID) | ORAL | 3 refills | Status: DC | PRN

## 2022-10-11 MED ORDER — QNASL 80 MCG/ACT NA AERS: 2.0000 | INHALATION_SPRAY | Freq: Every day | NASAL | 2 refills | Status: DC

## 2022-10-11 MED ORDER — AZELASTINE HCL 0.1 % NA SOLN: 1.0000 | Freq: Two times a day (BID) | NASAL | 1 refills | Status: DC

## 2022-10-11 MED ORDER — PREDNISONE 20 MG PO TABS: 20.0000 mg | ORAL_TABLET | Freq: Every day | ORAL | 0 refills | Status: DC

## 2022-10-11 MED ORDER — AZITHROMYCIN 250 MG PO TABS: ORAL_TABLET | ORAL | 0 refills | Status: AC

## 2022-10-11 NOTE — Patient Instructions (Addendum)
Zpack take as directed, take with food.  Prednisone '20mg'$  daily for 5 days , take with food.  Mucinex Twice daily  As needed  cough/congestion  Delsym 2 tsp Twice daily  as needed for cough  Tessalon Three times a day  As needed  cough .  Continue on Symbiocrt 2 puffs Twice daily   , rinse after use.  Zyrtec '10mg'$  At bedtime As needed   Astelin and QNASAL nasal spray As needed   ProAir As needed  Wheezing .  Activity as tolerated.  Follow up in office in 3 months and As needed   Please contact office for sooner follow up if symptoms do not improve or worsen or seek emergency care

## 2022-10-11 NOTE — Assessment & Plan Note (Signed)
Acute asthmatic bronchitic flare.  We will treat with a Z-Pak and short course of steroids. Continue with supportive care.  Plan  Patient Instructions  Zpack take as directed, take with food.  Prednisone '20mg'$  daily for 5 days , take with food.  Mucinex Twice daily  As needed  cough/congestion  Delsym 2 tsp Twice daily  as needed for cough  Tessalon Three times a day  As needed  cough .  Continue on Symbiocrt 2 puffs Twice daily   , rinse after use.  Zyrtec '10mg'$  At bedtime As needed   Astelin and QNASAL nasal spray As needed   ProAir As needed  Wheezing .  Activity as tolerated.  Follow up in office in 3 months and As needed   Please contact office for sooner follow up if symptoms do not improve or worsen or seek emergency care

## 2022-10-11 NOTE — Progress Notes (Signed)
_0  ID: Carrie Perkins, female    DOB: Apr 02, 1951, 71 y.o.   MRN: [id number]  No chief complaint on file.   Referring provider: Waunita Schooner, MD  HPI: 71 yo female never smoker followed for asthma and allergic rhinitis  Medical history significant for diastolic dysfunction and OSA (declines CPAP )  Travels extensively    TEST/EVENTS :  OSA> she had sleep study 2008 w/ AHI=22 & desat tp 80%; she declined CPAP    2D echo 2015 EF 14-43%, 2 diastolic dysfunction   Chest x-ray January 13, 2020 showed clear lungs.   PFTs August 28, 2020 showed FEV1 at 83%, ratio 87, FVC 72%, no significant bronchodilator response, DLCO 92%.  10/11/2022 Acute OV : Asthma Complains of 5 days of sore throat, congestion, drainage, cough, fatigued. No body aches . No fever. No known sick contacts. Taking Mucinex and Zyrtec .  Home Covid test negative on Thanksgiving Day.  Patient says she is now coughing up thick yellow mucus.  She is leaving to go on vacation in a few days and does not want to be sick while out of town. She remains on Symbicort, Zyrtec, Astelin and Qnasl nasal spray. Patient denies any chest pain orthopnea PND or leg swelling.  No hemoptysis.  Appetite is good with no nausea vomiting or diarrhea.  Allergies  Allergen Reactions   Amoxicillin-Pot Clavulanate     REACTION: severe abd pain---intolerant   Biaxin [Clarithromycin] Other (See Comments)    Caused severe stomach pain     Immunization History  Administered Date(s) Administered   Influenza Split 12/12/2011   Influenza Whole 08/14/2009   PFIZER Comirnaty(Gray Top)Covid-19 Tri-Sucrose Vaccine 08/28/2020   PFIZER(Purple Top)SARS-COV-2 Vaccination 12/20/2019, 01/14/2020   PNEUMOCOCCAL CONJUGATE-20 07/06/2021   Pneumococcal Polysaccharide-23 08/02/2022   Tdap 11/14/2010    Past Medical History:  Diagnosis Date   Allergic rhinitis, cause unspecified    Obstructive sleep apnea (adult) (pediatric)    Osteoarthrosis,  unspecified whether generalized or localized, unspecified site    Other and unspecified hyperlipidemia    Other dyspnea and respiratory abnormality    Overweight(278.02)    Unspecified asthma(493.90)    Unspecified urinary incontinence     Tobacco History: Social History   Tobacco Use  Smoking Status Never  Smokeless Tobacco Never   Counseling given: Not Answered   Outpatient Medications Prior to Visit  Medication Sig Dispense Refill   albuterol (PROAIR HFA) 108 (90 Base) MCG/ACT inhaler Inhale 1-2 puffs into the lungs every 6 (six) hours as needed for wheezing or shortness of breath. 1 each 1   aspirin EC 81 MG tablet Take 81 mg by mouth daily.     blood glucose meter kit and supplies Dispense based on patient and insurance preference. Use up to four times daily as directed. (FOR ICD-10 E11.9). 1 each 0   budesonide-formoterol (SYMBICORT) 160-4.5 MCG/ACT inhaler Inhale 2 puffs into the lungs 2 (two) times daily. 10.2 g 5   cetirizine (ZYRTEC) 10 MG chewable tablet Chew 10 mg by mouth 2 (two) times daily.     Cholecalciferol 1000 UNITS capsule Take 1,000 Units by mouth daily.     dextromethorphan-guaiFENesin (MUCINEX DM) 30-600 MG per 12 hr tablet Take 2 tablets by mouth every 12 (twelve) hours.     Glucose Blood (BLOOD GLUCOSE TEST STRIPS) STRP Test blood sugar as directed. (FOR ICD-10 E11.9). 100 strip 3   Microlet Lancets MISC Use to test blood sugar as directed. (FOR ICD-10 E11.9). 100 each 3  NON FORMULARY doterra essential oil Mixes in water     Red Yeast Rice 600 MG CAPS Take 1 capsule by mouth daily.     Semaglutide,0.25 or 0.5MG/DOS, 2 MG/3ML SOPN Inject 0.5 mg into the skin once a week. 3 mL 0   Turmeric (QC TUMERIC COMPLEX PO) Take by mouth.     Zinc Sulfate (ZINC 15 PO) Take by mouth. Not sure of the dose     azelastine (ASTELIN) 0.1 % nasal spray Place 1 spray into both nostrils 2 (two) times daily. Use in each nostril as directed 90 mL 1   Beclomethasone  Dipropionate (QNASL) 80 MCG/ACT AERS Place 2 sprays into the nose at bedtime. 3 each 2   No facility-administered medications prior to visit.     Review of Systems:   Constitutional:   No  weight loss, night sweats,  Fevers, chills,  +fatigue, or  lassitude.  HEENT:   No headaches,  Difficulty swallowing,  Tooth/dental problems, or  +Sore throat,                No sneezing, itching, ear ache,  +nasal congestion, post nasal drip,   CV:  No chest pain,  Orthopnea, PND, swelling in lower extremities, anasarca, dizziness, palpitations, syncope.   GI  No heartburn, indigestion, abdominal pain, nausea, vomiting, diarrhea, change in bowel habits, loss of appetite, bloody stools.   Resp:   No chest wall deformity  Skin: no rash or lesions.  GU: no dysuria, change in color of urine, no urgency or frequency.  No flank pain, no hematuria   MS:  No joint pain or swelling.  No decreased range of motion.  No back pain.    Physical Exam  BP 124/70 (BP Location: Left Arm, Cuff Size: Large)   Pulse 83   Temp 98.3 F (36.8 C) (Temporal)   Ht _0  (1.676 m)   Wt 208 lb 12.8 oz (94.7 kg)   SpO2 97%   BMI 33.70 kg/m   GEN: A/Ox3; pleasant , NAD, well nourished    HEENT:  B and E/AT,   NOSE-clear, THROAT-clear, no lesions, no postnasal drip or exudate noted.   NECK:  Supple w/ fair ROM; no JVD; normal carotid impulses w/o bruits; no thyromegaly or nodules palpated; no lymphadenopathy.    RESP  Clear  P & A; w/o, wheezes/ rales/ or rhonchi. no accessory muscle use, no dullness to percussion  CARD:  RRR, no m/r/g, no peripheral edema, pulses intact, no cyanosis or clubbing.  GI:   Soft & nt; nml bowel sounds; no organomegaly or masses detected.   Musco: Warm bil, no deformities or joint swelling noted.   Neuro: alert, no focal deficits noted.    Skin: Warm, no lesions or rashes    Lab Results:  CBC  No results found for: "PROBNP"  Imaging: No results found.       Latest  Ref Rng & Units 08/28/2020    9:00 AM  PFT Results  FVC-Pre L 2.24   FVC-Predicted Pre % 72   FVC-Post L 2.24   FVC-Predicted Post % 72   Pre FEV1/FVC % % 84   Post FEV1/FCV % % 87   FEV1-Pre L 1.89   FEV1-Predicted Pre % 80   FEV1-Post L 1.95   DLCO uncorrected ml/min/mmHg 18.53   DLCO UNC% % 92   DLCO corrected ml/min/mmHg 18.53   DLCO COR %Predicted % 92   DLVA Predicted % 120   TLC L 3.91  TLC % Predicted % 75   RV % Predicted % 71     No results found for: "NITRICOXIDE"      Assessment & Plan:   Asthma Acute asthmatic bronchitic flare.  We will treat with a Z-Pak and short course of steroids. Continue with supportive care.  Plan  Patient Instructions  Zpack take as directed, take with food.  Prednisone 42m daily for 5 days , take with food.  Mucinex Twice daily  As needed  cough/congestion  Delsym 2 tsp Twice daily  as needed for cough  Tessalon Three times a day  As needed  cough .  Continue on Symbiocrt 2 puffs Twice daily   , rinse after use.  Zyrtec 176mAt bedtime As needed   Astelin and QNASAL nasal spray As needed   ProAir As needed  Wheezing .  Activity as tolerated.  Follow up in office in 3 months and As needed   Please contact office for sooner follow up if symptoms do not improve or worsen or seek emergency care       Type 2 diabetes mellitus with other specified complication ([facility] use.  We will use a low-dose of prednisone 20 mg for 5 days.  Patient is to watch sugars closely per report if sugars are greater than 250.  Plan  Patient Instructions  Zpack take as directed, take with food.  Prednisone 2043maily for 5 days , take with food.  Mucinex Twice daily  As needed  cough/congestion  Delsym 2 tsp Twice daily  as needed for cough  Tessalon Three times a day  As needed  cough .  Continue on Symbiocrt 2 puffs Twice daily   , rinse after use.  Zyrtec 59m67m bedtime As needed   Astelin and QNASAL nasal  spray As needed   ProAir As needed  Wheezing .  Activity as tolerated.  Follow up in office in 3 months and As needed   Please contact office for sooner follow up if symptoms do not improve or worsen or seek emergency care         TammRexene Edison 10/11/2022

## 2022-10-11 NOTE — Assessment & Plan Note (Signed)
Patient is occasional steroid use.  We will use a low-dose of prednisone 20 mg for 5 days.  Patient is to watch sugars closely per report if sugars are greater than 250.  Plan  Patient Instructions  Zpack take as directed, take with food.  Prednisone '20mg'$  daily for 5 days , take with food.  Mucinex Twice daily  As needed  cough/congestion  Delsym 2 tsp Twice daily  as needed for cough  Tessalon Three times a day  As needed  cough .  Continue on Symbiocrt 2 puffs Twice daily   , rinse after use.  Zyrtec '10mg'$  At bedtime As needed   Astelin and QNASAL nasal spray As needed   ProAir As needed  Wheezing .  Activity as tolerated.  Follow up in office in 3 months and As needed   Please contact office for sooner follow up if symptoms do not improve or worsen or seek emergency care

## 2022-10-12 ENCOUNTER — Ambulatory Visit: Payer: Medicare HMO | Admitting: Pharmacist

## 2022-10-12 DIAGNOSIS — E782 Mixed hyperlipidemia: Secondary | ICD-10-CM

## 2022-10-12 DIAGNOSIS — E1169 Type 2 diabetes mellitus with other specified complication: Secondary | ICD-10-CM

## 2022-10-12 NOTE — Progress Notes (Signed)
Care Management Pharmacy Note  10/12/2022 Name:  Carrie Perkins MRN:  [id number] DOB:  18-Jan-1951  Summary: F/U visit -DM: A1c 10.1% (07/2022) - new diagnosis. Pt reports doing well with Ozempic 0.5 mg and fasting BG has been in 120s (highest 128 recently); she is due for eye exam -HLD: LDL 114 (07/2022), she is not on a statin; reviewed importance of statin for heart disease prevention now that she has diabetes, she declines to start a statin  Recommendations/Changes made from today's visit: -No med changes. Repeat A1c at OV in Jan. -Advised to schedule eye exam -offered [facility] exam but she wants to go to [place]: -Pharmacist follow up televisit scheduled for 1 month -PCP appt 12/13/22 (TOC - Dr Diona Browner)    Subjective: Carrie Perkins is an 71 y.o. year old female who is a primary patient of Waunita Schooner, MD.  The CCM team was consulted for assistance with disease management and care coordination needs.    Engaged with patient by telephone for follow up visit in response to provider referral for pharmacy case management and/or care coordination services.   Consent to Services:  The patient was given information about Chronic Care Management services, agreed to services, and gave verbal consent prior to initiation of services.  Please see initial visit note for detailed documentation.   Patient Care Team: Waunita Schooner, MD as PCP - General (Family Medicine) Charlton Haws, [facility] as Pharmacist (Pharmacist)  Recent office visits: 08/18/22 Dr Einar Pheasant OV: DM new dx (A1c 10.1); start metformin 500 mg with up-titration; start Ozempic 0.25 mg weekly; schedule eye exam; referred to nutrition  08/02/22 Dr Einar Pheasant OV: annual - referred to weight mgmt.  Recent consult visits: 10/11/22 NP Tammy Parrett (Pulmonary): asthma exacerbation - rx'd zpak, prednisone, tessalon.  12/06/21 Dr Sharlet Salina (Cardiology): initial eval - palpitations. Counseled on monitoring EKG w/ smartphone/watch. All showed  normal HR.   Hospital visits: None in previous 6 months   Objective:  Lab Results  Component Value Date   CREATININE 0.62 08/02/2022   BUN 9 08/02/2022   GFR 88.63 08/02/2022   GFRNONAA 102.19 05/11/2010   NA 137 08/02/2022   K 4.1 08/02/2022   CALCIUM 9.2 08/02/2022   CO2 29 08/02/2022   GLUCOSE 308 (H) 08/02/2022    Lab Results  Component Value Date/Time   HGBA1C 10.1 (H) 08/02/2022 08:42 AM   HGBA1C 6.4 01/20/2016 10:00 AM   GFR 88.63 08/02/2022 08:42 AM   GFR 90.37 07/06/2021 09:24 AM   MICROALBUR <0.7 08/18/2022 09:16 AM    Last diabetic Eye exam: No results found for: "HMDIABEYEEXA"  Last diabetic Foot exam: No results found for: "HMDIABFOOTEX"   Lab Results  Component Value Date   CHOL 201 (H) 08/02/2022   HDL 52.50 08/02/2022   LDLCALC 114 (H) 08/02/2022   LDLDIRECT 140.0 07/06/2021   TRIG 174.0 (H) 08/02/2022   CHOLHDL 4 08/02/2022       Latest Ref Rng & Units 08/02/2022    8:42 AM 07/06/2021    9:24 AM 01/20/2016   10:00 AM  Hepatic Function  Total Protein 6.0 - 8.3 g/dL 6.1  6.6  6.9   Albumin 3.5 - 5.2 g/dL 3.7  4.0  4.4   AST 0 - 37 U/L _0 ALT 0 - 35 U/L _1 Alk Phosphatase 39 - 117 U/L 78  71  61   Total Bilirubin 0.2 - 1.2 mg/dL  0.6  0.7  0.6   Bilirubin, Direct 0.0 - 0.3 mg/dL   0.1     Lab Results  Component Value Date/Time   TSH 1.73 08/02/2022 08:42 AM   TSH 0.93 01/20/2016 10:00 AM       Latest Ref Rng & Units 08/02/2022    8:42 AM 01/20/2016   10:00 AM 01/08/2014   10:14 AM  CBC  WBC 4.0 - 10.5 K/uL 5.7  6.5  7.5   Hemoglobin 12.0 - 15.0 g/dL 14.2  14.8  14.6   Hematocrit 36.0 - 46.0 % 41.1  42.2  43.6   Platelets 150.0 - 400.0 K/uL 272.0  284.0  316.0     Lab Results  Component Value Date/Time   VD25OH 36.13 08/02/2022 08:42 AM   VD25OH 14.79 (L) 01/20/2016 10:00 AM    Clinical ASCVD: No  The 10-year ASCVD risk score (Arnett DK, et al., 2019) is: 22.5%   Values used to calculate the score:     Age: 29  years     Sex: Female     Is Non-Hispanic African American: No     Diabetic: Yes     Tobacco smoker: No     Systolic Blood Pressure: 409 mmHg     Is BP treated: No     HDL Cholesterol: 52.5 mg/dL     Total Cholesterol: 201 mg/dL       08/02/2022    7:55 AM 05/16/2022   10:04 AM 10/03/2019    4:44 PM  Depression screen PHQ 2/9  Decreased Interest 0 0 0  Down, Depressed, Hopeless 0 0 0  PHQ - 2 Score 0 0 0  Altered sleeping 0    Tired, decreased energy 0    Change in appetite 0    Feeling bad or failure about yourself  0    Trouble concentrating 0    Moving slowly or fidgety/restless 0    Suicidal thoughts 0    PHQ-9 Score 0    Difficult doing work/chores Not difficult at all      Social History   Tobacco Use  Smoking Status Never  Smokeless Tobacco Never   BP Readings from Last 3 Encounters:  10/11/22 124/70  08/18/22 118/62  08/02/22 118/68   Pulse Readings from Last 3 Encounters:  10/11/22 83  08/18/22 93  08/02/22 75   Wt Readings from Last 3 Encounters:  10/11/22 208 lb 12.8 oz (94.7 kg)  08/18/22 209 lb 6 oz (95 kg)  08/02/22 210 lb (95.3 kg)   BMI Readings from Last 3 Encounters:  10/11/22 33.70 kg/m  08/18/22 34.84 kg/m  08/02/22 34.95 kg/m    Assessment/Interventions: Review of patient past medical history, allergies, medications, health status, including review of consultants reports, laboratory and other test data, was performed as part of comprehensive evaluation and provision of chronic care management services.   SDOH:  (Social Determinants of Health) assessments and interventions performed: Yes SDOH Interventions    Flowsheet Row Chronic Care Management from 09/12/2022 in [place] at [place] Interventions   Transportation Interventions Intervention Not Indicated  Financial Strain Interventions Intervention Not Indicated      [place]: No Food Insecurity (05/16/2022)  Transportation Needs:  No Transportation Needs (09/12/2022)  Depression (PHQ2-9): Low Risk  (08/02/2022)  Financial Resource Strain: Low Risk  (09/12/2022)  Physical Activity: Inactive (05/16/2022)  Stress: No Stress Concern Present (05/16/2022)  Tobacco Use: Low Risk  (10/11/2022)  CCM Care Plan  Allergies  Allergen Reactions   Amoxicillin-Pot Clavulanate     REACTION: severe abd pain---intolerant   Biaxin [Clarithromycin] Other (See Comments)    Caused severe stomach pain     Medications Reviewed Today     Reviewed by Charlton Haws, [facility] (Pharmacist) on 10/12/22 at 1512  Med List Status: <None>   Medication Order Taking? Sig Documenting Provider Last Dose Status Informant  albuterol (PROAIR HFA) 108 (90 Base) MCG/ACT inhaler [id number] Yes Inhale 1-2 puffs into the lungs every 6 (six) hours as needed for wheezing or shortness of breath. Parrett, Fonnie Mu, NP Taking Active   aspirin EC 81 MG tablet [id number] Yes Take 81 mg by mouth daily. [provider] Taking Active   azelastine (ASTELIN) 0.1 % nasal spray [id number] Yes Place 1 spray into both nostrils 2 (two) times daily. Use in each nostril as directed Parrett, Tammy S, NP Taking Active   azithromycin (ZITHROMAX Z-PAK) 250 MG tablet [id number] Yes Take 2 tablets (500 mg) on  Day 1,  followed by 1 tablet (250 mg) once daily on Days 2 through 5. Parrett, Fonnie Mu, NP Taking Active   Beclomethasone Dipropionate (QNASL) 80 MCG/ACT AERS [id number] Yes Place 2 sprays into the nose at bedtime. Parrett, Fonnie Mu, NP Taking Active   benzonatate (TESSALON) 200 MG capsule [id number] Yes Take 1 capsule (200 mg total) by mouth 3 (three) times daily as needed for cough. Parrett, Fonnie Mu, NP Taking Active   blood glucose meter kit and supplies [id number] Yes Dispense based on patient and insurance preference. Use up to four times daily as directed. (FOR ICD-10 E11.9). Jinny Sanders, MD Taking Active   budesonide-formoterol [facility]) 160-4.5 MCG/ACT inhaler  [id number] Yes Inhale 2 puffs into the lungs 2 (two) times daily. Parrett, Fonnie Mu, NP Taking Active   cetirizine (ZYRTEC) 10 MG chewable tablet [id number] Yes Chew 10 mg by mouth 2 (two) times daily. [provider] Taking Active   Cholecalciferol 1000 UNITS capsule [id number] Yes Take 1,000 Units by mouth daily. [provider] Taking Active   dextromethorphan-guaiFENesin [facility] DM) 30-600 MG per 12 hr tablet [id number] Yes Take 2 tablets by mouth every 12 (twelve) hours. [provider] Taking Active   Glucose Blood (BLOOD GLUCOSE TEST STRIPS) STRP [id number] Yes Test blood sugar as directed. (FOR ICD-10 E11.9). Jinny Sanders, MD Taking Active   Microlet Lancets MISC [id number] Yes Use to test blood sugar as directed. (FOR ICD-10 E11.9). Jinny Sanders, MD Taking Active   Baruch Gouty [id number] Yes doterra essential oil Mixes in water [provider] Taking Active   predniSONE (DELTASONE) 20 MG tablet [id number] Yes Take 1 tablet (20 mg total) by mouth daily with breakfast. Parrett, Tammy S, NP Taking Active   Red Yeast Rice 600 MG CAPS [id number] Yes Take 1 capsule by mouth daily. [provider] Taking Active   Semaglutide,0.25 or 0.5MG/DOS, 2 MG/3ML SOPN [id number] Yes Inject 0.5 mg into the skin once a week. Jinny Sanders, MD Taking Active   Turmeric (QC TUMERIC COMPLEX PO) [id number] Yes Take by mouth. [provider] Taking Active   Zinc Sulfate (ZINC 15 PO) [id number] Yes Take by mouth. Not sure of the dose [provider] Taking Active             Patient Active Problem List   Diagnosis Date Noted   Type 2 diabetes mellitus with other specified complication ([place]) 02/58/5277   LVH (left ventricular hypertrophy) 07/06/2021  Visual changes 03/12/2021   Benign paroxysmal positional vertigo 09/10/2020   History of adenomatous polyp of colon 12/14/2019   Diverticulosis 12/09/2019   Osteopenia 10/07/2019   Morbid obesity ([place])  10/03/2019   Elevated blood pressure reading 10/03/2019   Chronic fatigue 09/05/2016   Impaired fasting glucose 09/81/1914   Diastolic dysfunction 78/29/5621   Abnormal EKG 03/24/2014   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 04/11/2013   Knee pain 09/03/2012   Osteoarthritis 04/15/2009   URINARY INCONTINENCE 04/15/2009   Hyperlipidemia 04/14/2009   Allergic rhinitis 04/14/2009   GASTROESOPHAGEAL REFLUX DISEASE 04/14/2009   Obstructive sleep apnea 10/23/2007   Asthma 10/15/2007   SNORING 10/15/2007    Immunization History  Administered Date(s) Administered   Influenza Split 12/12/2011   Influenza Whole 08/14/2009   PFIZER Comirnaty(Gray Top)Covid-19 Tri-Sucrose Vaccine 08/28/2020   PFIZER(Purple Top)SARS-COV-2 Vaccination 12/20/2019, 01/14/2020   PNEUMOCOCCAL CONJUGATE-20 07/06/2021   Pneumococcal Polysaccharide-23 08/02/2022   Tdap 11/14/2010    Conditions to be addressed/monitored:  Hyperlipidemia and Diabetes  Care Plan : Pharmacy Care Plan  Updates made by Charlton Haws, [place] since 10/12/2022 12:00 AM     Problem: Hyperlipidemia and Diabetes   Priority: High     Long-Range Goal: Disease mgmt   Start Date: 09/12/2022  Expected End Date: 09/13/2023  This Visit's Progress: On track  Recent Progress: On track  Priority: High  Note:   Current Barriers:  Suboptimal therapeutic regimen for DM/HLD  Pharmacist Clinical Goal(s):  Patient will adhere to plan to optimize therapeutic regimen for DM as evidenced by report of adherence to recommended medication management changes through collaboration with PharmD and provider.   Interventions: 1:1 collaboration with PCP regarding development and update of comprehensive plan of care as evidenced by provider attestation and co-signature Inter-disciplinary care team collaboration (see longitudinal plan of care) Comprehensive medication review performed; medication list updated in electronic medical record  Hyperlipidemia:  (LDL goal < 70) -Uncontrolled - LDL 114 (07/2022) -Current treatment: Red yeast rice - Query appropriate Aspirin 81 mg daily - Appropriate, Effective, Safe, Accessible -Medications previously tried: simvastatin  -Educated on Cholesterol goals; Benefits of statin for ASCVD risk reduction; role of statin therapy in someone with diabetes -Recommend to start statin therapy given recent DM diagnosis - pt declined. Reassess periodically  Diabetes (A1c goal <7%) -Uncontrolled, improving - A1c 10.1% (07/2022 - new Dx); pt is doing well with Ozempic -Meal patterns: pt eats "on the go" a lot, she rarely has time to prepare foods; she reports appetite is reduced since starting Ozempic -Fasting BG: 120s; highest 128 -Current medications: Ozempic 0.5 mg weekly - Appropriate, Effective, Safe, Accessible -Medications previously tried: metformin (1500 mg starting dose - GI upset; pt declined to restart at lower dose)  -Educated on A1c and blood sugar goals;Benefits of routine self-monitoring of blood sugar; CGM - not covered by her insurance -Discussed eye exam, pt is trying to schedule appt with [facility] eye doctor -Recommend to continue current medication; repeat A1c at [place] in January  Health Maintenance -Vaccine gaps: Shingrix, TDAP  Patient Goals/Self-Care Activities Patient will:  - take medications as prescribed as evidenced by patient report and record review focus on medication adherence by routine check glucose daily, document, and provide at future appointments       Medication Assistance: None required.  Patient affirms current coverage meets needs.  Compliance/Adherence/Medication fill history: Care Gaps: Eye exam - discussed. Pt is working on getting scheduled with [facility] eye doctor. Offered [facility] exam before end of year  but pt wants to go to [facility].  Star-Rating Drugs: Ozempic - PDC 100%  Medication Access: Within the past 30 days, how often has patient missed a dose of medication? 0 Is a  pillbox or other method used to improve adherence? Yes  Factors that may affect medication adherence? adverse effects of medications and lack of understanding of disease management Are meds synced by current pharmacy? No  Are meds delivered by current pharmacy? No  Does patient experience delays in picking up medications due to transportation concerns? No   [facility] Services Reviewed: Is patient disadvantaged to use [facility]?: No  Current Rx insurance plan: [employer] Name and location of Current pharmacy:  [id number] [place] [address] [id number]: [phone]: [phone] [facility] services reviewed with patient today?: No  Patient requests to transfer care to [facility]?: No  Reason patient declined to change pharmacies: Not mentioned at this visit   Care Plan and Follow Up Patient Decision:  Patient agrees to Care Plan and Follow-up.  Plan: Telephone follow up appointment with care management team member scheduled for:  3 months  LCharlene Brooke PharmD, BCACP Clinical Pharmacist [facility]Primary [phone]

## 2022-10-12 NOTE — Patient Instructions (Signed)
Visit Information  Phone number for Pharmacist: [phone]   Goals Addressed   None     Care Plan : Pharmacy Care Plan  Updates made by Charlton Haws, RPH since 10/12/2022 12:00 AM     Problem: Hyperlipidemia and Diabetes   Priority: High     Long-Range Goal: Disease mgmt   Start Date: 09/12/2022  Expected End Date: 09/13/2023  This Visit's Progress: On track  Recent Progress: On track  Priority: High  Note:   Current Barriers:  Suboptimal therapeutic regimen for DM/HLD  Pharmacist Clinical Goal(s):  Patient will adhere to plan to optimize therapeutic regimen for DM as evidenced by report of adherence to recommended medication management changes through collaboration with PharmD and provider.   Interventions: 1:1 collaboration with PCP regarding development and update of comprehensive plan of care as evidenced by provider attestation and co-signature Inter-disciplinary care team collaboration (see longitudinal plan of care) Comprehensive medication review performed; medication list updated in electronic medical record  Hyperlipidemia: (LDL goal < 70) -Uncontrolled - LDL 114 (07/2022) -Current treatment: Red yeast rice - Query appropriate Aspirin 81 mg daily - Appropriate, Effective, Safe, Accessible -Medications previously tried: simvastatin  -Educated on Cholesterol goals; Benefits of statin for ASCVD risk reduction; role of statin therapy in someone with diabetes -Recommend to start statin therapy given recent DM diagnosis - pt declined. Reassess periodically  Diabetes (A1c goal <7%) -Uncontrolled, improving - A1c 10.1% (07/2022 - new Dx); pt is doing well with Ozempic -Meal patterns: pt eats "on the go" a lot, she rarely has time to prepare foods; she reports appetite is reduced since starting Ozempic -Fasting BG: 120s; highest 128 -Current medications: Ozempic 0.5 mg weekly - Appropriate, Effective, Safe, Accessible -Medications previously tried: metformin  (1500 mg starting dose - GI upset; pt declined to restart at lower dose)  -Educated on A1c and blood sugar goals;Benefits of routine self-monitoring of blood sugar; CGM - not covered by her insurance -Discussed eye exam, pt is trying to schedule appt with [facility] doctor -Recommend to continue current medication; repeat A1c at [place] in January  Health Maintenance -Vaccine gaps: Shingrix, TDAP  Patient Goals/Self-Care Activities Patient will:  - take medications as prescribed as evidenced by patient report and record review focus on medication adherence by routine check glucose daily, document, and provide at future appointments       Patient verbalizes understanding of instructions and care plan provided today and agrees to view in [place]. Active MyChart status and patient understanding of how to access instructions and care plan via MyChart confirmed with patient.    Telephone follow up appointment with pharmacy team member scheduled for: 3 months  Charlene Brooke, PharmD, [facility] Clinical Pharmacist [facility] [phone]

## 2022-11-11 ENCOUNTER — Other Ambulatory Visit: Payer: Self-pay | Admitting: Family Medicine

## 2022-11-11 DIAGNOSIS — E1169 Type 2 diabetes mellitus with other specified complication: Secondary | ICD-10-CM

## 2022-11-21 ENCOUNTER — Other Ambulatory Visit: Payer: Self-pay

## 2022-11-21 DIAGNOSIS — E1169 Type 2 diabetes mellitus with other specified complication: Secondary | ICD-10-CM

## 2022-11-21 NOTE — Telephone Encounter (Signed)
Prescription Request  11/21/2022  Is this a "Controlled Substance" medicine? No  LOV: 08/18/2022 with Dr Einar Pheasant What is the name of the medication or equipment? Metformin 500 mg  Have you contacted your pharmacy to request a refill? No per pt   Which pharmacy would you like this sent to?  [id number] [place] [facility] [address] [address]Phone: [phone]: [phone]   [facility] for metformin HCL 500 mg tab Last refilled #360 x 0 taking 2 tab bid with meals by Dr CEinar Pheasant   Pt does have TOC with Dr BDiona Browneron 12/13/2022.  Please see 09/12/22 and 10/12/22 notes when pt spoke with LMendel Ryderpharmacist; pt had stopped taking metformin and declined to restart metformin at lower dose due to GI issues.   I spoke with BTheadora Ramaat CNorth [place]and from pts record cannot tell if this was auto refill or not; I spoke with pt and she said she did not know about a refill request for metformin because pt is not taking metformin. Pt said to disregard refill request for metformin. No refill done.

## 2022-11-28 ENCOUNTER — Ambulatory Visit: Payer: Medicare HMO | Admitting: Nutrition

## 2022-12-01 ENCOUNTER — Other Ambulatory Visit: Payer: Self-pay | Admitting: Adult Health

## 2022-12-08 ENCOUNTER — Other Ambulatory Visit: Payer: Self-pay | Admitting: Family Medicine

## 2022-12-08 DIAGNOSIS — E1169 Type 2 diabetes mellitus with other specified complication: Secondary | ICD-10-CM

## 2022-12-08 NOTE — Telephone Encounter (Signed)
Last office visit 08/18/22 with Dr. Einar Pheasant for DM.  Last refilled 11/11/22 for 3 ml with no refills.  TOC with Dr. Diona Browner 12/13/2022.  Ok to refill?

## 2022-12-12 ENCOUNTER — Ambulatory Visit (INDEPENDENT_AMBULATORY_CARE_PROVIDER_SITE_OTHER): Payer: Medicare HMO | Admitting: Internal Medicine

## 2022-12-12 ENCOUNTER — Encounter (INDEPENDENT_AMBULATORY_CARE_PROVIDER_SITE_OTHER): Payer: Self-pay | Admitting: Internal Medicine

## 2022-12-12 VITALS — BP 133/72 | HR 83 | Temp 97.8°F | Ht 65.0 in

## 2022-12-12 DIAGNOSIS — Z6834 Body mass index (BMI) 34.0-34.9, adult: Secondary | ICD-10-CM | POA: Diagnosis not present

## 2022-12-12 DIAGNOSIS — G4733 Obstructive sleep apnea (adult) (pediatric): Secondary | ICD-10-CM | POA: Diagnosis not present

## 2022-12-12 DIAGNOSIS — E669 Obesity, unspecified: Secondary | ICD-10-CM

## 2022-12-12 DIAGNOSIS — E1169 Type 2 diabetes mellitus with other specified complication: Secondary | ICD-10-CM | POA: Diagnosis not present

## 2022-12-12 DIAGNOSIS — Z7985 Long-term (current) use of injectable non-insulin antidiabetic drugs: Secondary | ICD-10-CM | POA: Diagnosis not present

## 2022-12-12 DIAGNOSIS — E782 Mixed hyperlipidemia: Secondary | ICD-10-CM | POA: Diagnosis not present

## 2022-12-12 NOTE — Progress Notes (Signed)
Office: [phone]  /  Fax: [phone]   Initial Visit  Carrie Perkins was seen in clinic today to evaluate for obesity. She is interested in losing weight to improve overall health and reduce the risk of weight related complications. She presents today to review program treatment options, initial physical assessment, and evaluation.     She was referred by: PCP  When asked what else they would like to accomplish? She states: Improve energy levels and physical activity, Improve existing medical conditions, Reduce number of medications, and Improve quality of life  When asked how has your weight affected you? She states: Contributed to medical problems, Contributed to orthopedic problems or mobility issues, Having fatigue, and Having poor endurance  Some associated conditions: Hyperlipidemia, Diabetes, and Vitamin D Deficiency  Contributing factors: Family history, Disruption of circadian rhythm, Reduced physical activity, and Menopause  Weight promoting medications identified: None  Current nutrition plan: None  Current level of physical activity: None  Current or previous pharmacotherapy: GLP-1  Response to medication: Other: Currently on Ozempic for diabetes with minimal weight loss over 8 weeks   Past medical history includes:   Past Medical History:  Diagnosis Date   Allergic rhinitis, cause unspecified    Obstructive sleep apnea (adult) (pediatric)    Osteoarthrosis, unspecified whether generalized or localized, unspecified site    Other and unspecified hyperlipidemia    Other dyspnea and respiratory abnormality    Overweight(278.02)    Unspecified asthma(493.90)    Unspecified urinary incontinence      Objective:   BP 133/72   Pulse 83   Temp 97.8 F (36.6 C)   Ht '5\' 5"'$  (1.651 m)   SpO2 94%   BMI 34.75 kg/m  She was weighed on the bioimpedance scale: Body mass index is 34.75 kg/m.  Peak Weight: , Body Fat%: 48, Visceral Fat Rating: 15, Weight trend over  the last 12 months: Decreasing  General:  Alert, oriented and cooperative. Patient is in no acute distress.  Respiratory: Normal respiratory effort, no problems with respiration noted  Extremities: Normal range of motion.    Mental Status: Normal mood and affect. Normal behavior. Normal judgment and thought content.   DIAGNOSTIC DATA REVIEWED:  BMET    Component Value Date/Time   NA 137 08/02/2022 0842   K 4.1 08/02/2022 0842   CL 101 08/02/2022 0842   CO2 29 08/02/2022 0842   GLUCOSE 308 (H) 08/02/2022 0842   BUN 9 08/02/2022 0842   CREATININE 0.62 08/02/2022 0842   CALCIUM 9.2 08/02/2022 0842   GFRNONAA 102.19 05/11/2010 0915   Lab Results  Component Value Date   HGBA1C 10.1 (H) 08/02/2022   HGBA1C 5.8 04/14/2009   No results found for: "INSULIN" CBC    Component Value Date/Time   WBC 5.7 08/02/2022 0842   RBC 4.69 08/02/2022 0842   HGB 14.2 08/02/2022 0842   HCT 41.1 08/02/2022 0842   PLT 272.0 08/02/2022 0842   MCV 87.7 08/02/2022 0842   MCHC 34.4 08/02/2022 0842   RDW 13.7 08/02/2022 0842   Iron/TIBC/Ferritin/ %Sat No results found for: "IRON", "TIBC", "FERRITIN", "IRONPCTSAT" Lipid Panel     Component Value Date/Time   CHOL 201 (H) 08/02/2022 0842   TRIG 174.0 (H) 08/02/2022 0842   HDL 52.50 08/02/2022 0842   CHOLHDL 4 08/02/2022 0842   VLDL 34.8 08/02/2022 0842   LDLCALC 114 (H) 08/02/2022 0842   LDLDIRECT 140.0 07/06/2021 0924   Hepatic Function Panel     Component Value Date/Time  PROT 6.1 08/02/2022 0842   ALBUMIN 3.7 08/02/2022 0842   AST 14 08/02/2022 0842   ALT 21 08/02/2022 0842   ALKPHOS 78 08/02/2022 0842   BILITOT 0.6 08/02/2022 0842   BILIDIR 0.1 01/20/2016 1000      Component Value Date/Time   TSH 1.73 08/02/2022 0842     Assessment and Plan:    1. Type 2 diabetes mellitus with other specified complication, without long-term current use of insulin (HCC) Her most recent A1c is 10.1 and uncontrolled.  She is currently on  Ozempic 0.5 mg once a week and this is managed by her PCP.  She did not tolerate metformin.  She will benefit from a reduced calorie nutrition plan with a low glycemic load.  She may also need to start basal insulin while GLP-1 is taking full effect this this was considered a basal plus regimen.  2. Class 1 obesity with serious comorbidity and body mass index (BMI) of 34.0 to 34.9 in adult, unspecified obesity type We reviewed weight, biometrics, associated medical conditions and contributing factors with patient. She would benefit from weight loss therapy via a modified calorie, low-carb, high-protein nutritional plan tailored to their REE (resting energy expenditure) which will be determined by indirect calorimetry.  We will also assess for cardiometabolic risk and nutritional derangements via fasting serologies at her next appointment.  3. Mixed hyperlipidemia Her last LDL was 114. The 10-year ASCVD risk score (Arnett DK, et al., 2019) is: 25.4% she would benefit from moderate to high intensity statin therapy which she has declined or is no longer taking.   4. OSA (obstructive sleep apnea) Patient reports having been diagnosed in the past she has lost some weight so she never started PAP therapy.  She also reports being claustrophobic.  She may benefit from a repeat sleep study and if mild to moderate may be consultation for an oral appliance as this was may have an effect on her overall health if still present.  Losing 15% of body weight may improve AHI    Obesity Treatment / Action Plan:  Patient will work on garnering support from family and friends to begin weight loss journey. Will work on eliminating or reducing the presence of highly palatable, calorie dense foods in the home. Will complete provided nutritional and psychosocial assessment questionnaire before the next appointment. Will be scheduled for indirect calorimetry to determine resting energy expenditure in a fasting state.  This  will allow [place] to create a reduced calorie, high-protein meal plan to promote loss of fat mass while preserving muscle mass. Will think about ideas on how to incorporate physical activity into their daily routine. Was counseled on nutritional approaches to weight loss and benefits of complex carbs and high quality protein as part of nutritional weight management. Was counseled on pharmacotherapy and role as an adjunct in weight management.   Obesity Education Performed Today:  She was weighed on the bioimpedance scale and results were discussed and documented in the synopsis.  We discussed obesity as a disease and the importance of a more detailed evaluation of all the factors contributing to the disease.  We discussed the importance of long term lifestyle changes which include nutrition, exercise and behavioral modifications as well as the importance of customizing this to her specific health and social needs.  We discussed the benefits of reaching a healthier weight to alleviate the symptoms of existing conditions and reduce the risks of the biomechanical, metabolic and psychological effects of obesity.  Carrie Riles  Perkins appears to be in the action stage of change and states they are ready to start intensive lifestyle modifications and behavioral modifications.  30 minutes was spent today on this visit including the above counseling, pre-visit chart review, and post-visit documentation.  Reviewed by clinician on day of visit: allergies, medications, problem list, medical history, surgical history, family history, social history, and previous encounter notes.    I have reviewed the above documentation for accuracy and completeness, and I agree with the above.  Thomes Dinning, MD

## 2022-12-13 ENCOUNTER — Ambulatory Visit (INDEPENDENT_AMBULATORY_CARE_PROVIDER_SITE_OTHER): Payer: Medicare HMO | Admitting: Family Medicine

## 2022-12-13 ENCOUNTER — Encounter: Payer: Self-pay | Admitting: Family Medicine

## 2022-12-13 ENCOUNTER — Ambulatory Visit: Payer: Medicare HMO | Admitting: Nutrition

## 2022-12-13 VITALS — BP 110/70 | HR 89 | Temp 97.9°F | Ht 65.0 in | Wt 211.2 lb

## 2022-12-13 DIAGNOSIS — E785 Hyperlipidemia, unspecified: Secondary | ICD-10-CM

## 2022-12-13 DIAGNOSIS — J4541 Moderate persistent asthma with (acute) exacerbation: Secondary | ICD-10-CM | POA: Diagnosis not present

## 2022-12-13 DIAGNOSIS — G4733 Obstructive sleep apnea (adult) (pediatric): Secondary | ICD-10-CM

## 2022-12-13 DIAGNOSIS — H8110 Benign paroxysmal vertigo, unspecified ear: Secondary | ICD-10-CM

## 2022-12-13 DIAGNOSIS — E661 Drug-induced obesity: Secondary | ICD-10-CM

## 2022-12-13 DIAGNOSIS — J302 Other seasonal allergic rhinitis: Secondary | ICD-10-CM

## 2022-12-13 DIAGNOSIS — I5032 Chronic diastolic (congestive) heart failure: Secondary | ICD-10-CM

## 2022-12-13 DIAGNOSIS — M1990 Unspecified osteoarthritis, unspecified site: Secondary | ICD-10-CM | POA: Diagnosis not present

## 2022-12-13 DIAGNOSIS — Z6835 Body mass index (BMI) 35.0-35.9, adult: Secondary | ICD-10-CM | POA: Diagnosis not present

## 2022-12-13 DIAGNOSIS — E1169 Type 2 diabetes mellitus with other specified complication: Secondary | ICD-10-CM

## 2022-12-13 LAB — POCT GLYCOSYLATED HEMOGLOBIN (HGB A1C): Hemoglobin A1C: 8 % — AB (ref 4.0–5.6)

## 2022-12-13 MED ORDER — SEMAGLUTIDE (1 MG/DOSE) 4 MG/3ML ~~LOC~~ SOPN: 1.0000 mg | PEN_INJECTOR | SUBCUTANEOUS | 11 refills | Status: DC

## 2022-12-13 NOTE — Progress Notes (Signed)
Patient ID: MALEEYA PETERKIN, female    DOB: Aug 29, 1951, 72 y.o.   MRN: [id number]  This visit was conducted in person.  BP 110/70   Pulse 89   Temp 97.9 F (36.6 C) (Oral)   Ht '5\' 5"'$  (1.651 m)   Wt 211 lb 4 oz (95.8 kg)   SpO2 98%   BMI 35.15 kg/m    CC:  Chief Complaint  Patient presents with   Establish Care   Diabetes    Subjective:   HPI: BEYONKA PITNEY is a 72 y.o. female presenting on 12/13/2022 for Establish Care and Diabetes  Previous PCP: Einar Pheasant Last AMW/CPX: 05/16/2022 and 08/02/22 repectively  Reviewed last office visit by previous PCP August 18, 2022 for diabetes, new diagnosis. Started on metformin 500 mg daily with up titration to 1000 mg twice daily with food. Started on Ozempic 0.25 mg weekly. Referral to diabetes education placed.  Followed by Claude Manges, pharmacist.  Reviewed last office visit note from September 12, 2022.  Per note had side effects to metformin so stopped.  Of note she did not uptitrate slowly from 500 mg.   She has tolerated  semaglutide 0.5 mg weekly now for the last week. No SE.  Lab Results  Component Value Date   HGBA1C 8.0 (A) 12/13/2022    She had appt yesterday healthy weight loss and wellness clinic.. but insurance does not cover. Wt Readings from Last 3 Encounters:  12/13/22 211 lb 4 oz (95.8 kg)  10/11/22 208 lb 12.8 oz (94.7 kg)  08/18/22 209 lb 6 oz (95 kg)   She limits carbs, ozempic has decreased her appetite.  She  no longer exercises since COVID.,. plan to restart.   Hyperlipidemia, not on statin.  LDL not at goal less than 100 Lab Results  Component Value Date   CHOL 201 (H) 08/02/2022   HDL 52.50 08/02/2022   LDLCALC 114 (H) 08/02/2022   LDLDIRECT 140.0 07/06/2021   TRIG 174.0 (H) 08/02/2022   CHOLHDL 4 08/02/2022   Has seen Dr. [facility] 12/06/21 for abnormal EKG, Per that note.. nml ECHO and nuclear stress test normal.  Relevant past medical, surgical, family and social history reviewed and  updated as indicated. Interim medical history since our last visit reviewed. Allergies and medications reviewed and updated. Outpatient Medications Prior to Visit  Medication Sig Dispense Refill   albuterol (VENTOLIN HFA) 108 (90 Base) MCG/ACT inhaler INHALE 1-2 PUFFS BY MOUTH EVERY 6 HOURS AS NEEDED FOR WHEEZE OR SHORTNESS OF BREATH 6.7 each 1   aspirin EC 81 MG tablet Take 81 mg by mouth daily.     azelastine (ASTELIN) 0.1 % nasal spray Place 1 spray into both nostrils 2 (two) times daily as needed for rhinitis. Use in each nostril as directed     Beclomethasone Dipropionate (QNASL) 80 MCG/ACT AERS Place 2 each into the nose at bedtime as needed.     benzonatate (TESSALON) 200 MG capsule Take 1 capsule (200 mg total) by mouth 3 (three) times daily as needed for cough. 45 capsule 3   blood glucose meter kit and supplies Dispense based on patient and insurance preference. Use up to four times daily as directed. (FOR ICD-10 E11.9). 1 each 0   budesonide-formoterol (SYMBICORT) 160-4.5 MCG/ACT inhaler Inhale 2 puffs into the lungs 2 (two) times daily as needed.     cetirizine (ZYRTEC) 10 MG chewable tablet Chew 10 mg by mouth 2 (two) times daily.     Cholecalciferol  1000 UNITS capsule Take 1,000 Units by mouth daily.     dextromethorphan-guaiFENesin (MUCINEX DM) 30-600 MG per 12 hr tablet Take 2 tablets by mouth every 12 (twelve) hours.     Glucose Blood (BLOOD GLUCOSE TEST STRIPS) STRP Test blood sugar as directed. (FOR ICD-10 E11.9). 100 strip 3   Microlet Lancets MISC Use to test blood sugar as directed. (FOR ICD-10 E11.9). 100 each 3   NON FORMULARY doterra essential oil Mixes in water     Red Yeast Rice 600 MG CAPS Take 1 capsule by mouth daily.     Turmeric (QC TUMERIC COMPLEX PO) Take 1 tablet by mouth daily.     Zinc Sulfate (ZINC 15 PO) Take 1 tablet by mouth daily. Not sure of the dose     azelastine (ASTELIN) 0.1 % nasal spray Place 1 spray into both nostrils 2 (two) times daily. Use in  each nostril as directed (Patient taking differently: Place 1 spray into both nostrils 2 (two) times daily as needed. Use in each nostril as directed) 90 mL 1   Beclomethasone Dipropionate (QNASL) 80 MCG/ACT AERS Place 2 sprays into the nose at bedtime. (Patient taking differently: Place 2 sprays into the nose at bedtime as needed.) 3 each 2   budesonide-formoterol (SYMBICORT) 160-4.5 MCG/ACT inhaler Inhale 2 puffs into the lungs 2 (two) times daily. (Patient taking differently: Inhale 2 puffs into the lungs 2 (two) times daily as needed.) 10.2 g 5   predniSONE (DELTASONE) 20 MG tablet Take 1 tablet (20 mg total) by mouth daily with breakfast. 5 tablet 0   Semaglutide,0.25 or 0.'5MG'$ /DOS, (OZEMPIC, 0.25 OR 0.5 MG/DOSE,) 2 MG/3ML SOPN INJECT 0.5 MG INTO THE SKIN ONE TIME PER WEEK 3 mL 11   No facility-administered medications prior to visit.     Per HPI unless specifically indicated in ROS section below Review of Systems  Constitutional:  Negative for fatigue and fever.  HENT:  Negative for congestion.   Eyes:  Negative for pain.  Respiratory:  Negative for cough and shortness of breath.   Cardiovascular:  Negative for chest pain, palpitations and leg swelling.  Gastrointestinal:  Negative for abdominal pain.  Genitourinary:  Negative for dysuria and vaginal bleeding.  Musculoskeletal:  Negative for back pain.  Neurological:  Negative for syncope, light-headedness and headaches.  Psychiatric/Behavioral:  Negative for dysphoric mood.    Objective:  BP 110/70   Pulse 89   Temp 97.9 F (36.6 C) (Oral)   Ht '5\' 5"'$  (1.651 m)   Wt 211 lb 4 oz (95.8 kg)   SpO2 98%   BMI 35.15 kg/m   Wt Readings from Last 3 Encounters:  12/13/22 211 lb 4 oz (95.8 kg)  10/11/22 208 lb 12.8 oz (94.7 kg)  08/18/22 209 lb 6 oz (95 kg)      Physical Exam Constitutional:      General: She is not in acute distress.    Appearance: Normal appearance. She is well-developed. She is not ill-appearing or  toxic-appearing.  HENT:     Head: Normocephalic.     Right Ear: Hearing, tympanic membrane, ear canal and external ear normal. Tympanic membrane is not erythematous, retracted or bulging.     Left Ear: Hearing, tympanic membrane, ear canal and external ear normal. Tympanic membrane is not erythematous, retracted or bulging.     Nose: No mucosal edema or rhinorrhea.     Right Sinus: No maxillary sinus tenderness or frontal sinus tenderness.     Left Sinus: No  maxillary sinus tenderness or frontal sinus tenderness.     Mouth/Throat:     Pharynx: Uvula midline.  Eyes:     General: Lids are normal. Lids are everted, no foreign bodies appreciated.     Conjunctiva/sclera: Conjunctivae normal.     Pupils: Pupils are equal, round, and reactive to light.  Neck:     Thyroid: No thyroid mass or thyromegaly.     Vascular: No carotid bruit.     Trachea: Trachea normal.  Cardiovascular:     Rate and Rhythm: Normal rate and regular rhythm.     Pulses: Normal pulses.     Heart sounds: Normal heart sounds, S1 normal and S2 normal. No murmur heard.    No friction rub. No gallop.  Pulmonary:     Effort: Pulmonary effort is normal. No tachypnea or respiratory distress.     Breath sounds: Normal breath sounds. No decreased breath sounds, wheezing, rhonchi or rales.  Abdominal:     General: Bowel sounds are normal.     Palpations: Abdomen is soft.     Tenderness: There is no abdominal tenderness.  Musculoskeletal:     Cervical back: Normal range of motion and neck supple.  Skin:    General: Skin is warm and dry.     Findings: No rash.  Neurological:     Mental Status: She is alert.  Psychiatric:        Mood and Affect: Mood is not anxious or depressed.        Speech: Speech normal.        Behavior: Behavior normal. Behavior is cooperative.        Thought Content: Thought content normal.        Judgment: Judgment normal.       Results for orders placed or performed in visit on 12/13/22  POCT  glycosylated hemoglobin (Hb A1C)  Result Value Ref Range   Hemoglobin A1C 8.0 (A) 4.0 - 5.6 %   HbA1c POC (<> result, manual entry)     HbA1c, POC (prediabetic range)     HbA1c, POC (controlled diabetic range)      Assessment and Plan  Type 2 diabetes mellitus with other specified complication, without long-term current use of insulin (HCC) Assessment & Plan:  New recent new diagnosis, A1c improved from 10 down to 8 but not yet at goal.  Had side effects to metformin but did not titrate up.   Increase semaglutide to 1 mg weekly.  Orders: -     POCT glycosylated hemoglobin (Hb A1C) -     Hemoglobin A1c; Future -     Lipid panel; Future -     Comprehensive metabolic panel; Future  Class 2 drug-induced obesity with serious comorbidity and body mass index (BMI) of 35.0 to 35.9 in adult Assessment & Plan: Encouraged exercise, weight loss, healthy eating habits. Had trial of GLP-1: Semaglutide   Hyperlipidemia associated with type 2 diabetes mellitus ([place]) Assessment & Plan: Chronic, inadequate control.  Statin indicated given diagnosis of diabetes. Patient previously refused given fear of possible side effects despite need.  She is not  interested in statin at this time.  She is using red yeast rice.   Chronic heart failure with preserved ejection fraction [facility]) Assessment & Plan: Noted on ECHO in 2015 by Dr. Lenna Gilford.. referred to cardiology.  Repeat in  last 3 years per pt... was normal  ECHO at cardiology per pt.    OSA (obstructive sleep apnea) Assessment & Plan:  Does  not have a CPAP machine. No fatigue, no AM headaches.   Moderate persistent asthma with acute exacerbation Assessment & Plan:  Chronic, Controlled on Symbicort as needed, uses albuterol prn.   Seasonal allergic rhinitis, unspecified trigger Assessment & Plan: Stable, chronic.  Continue current medication.     Benign paroxysmal positional vertigo, unspecified laterality Assessment & Plan:   Chronic intermittent. Has seen ENT, Dr. Tami Ribas in past   Osteoarthritis, unspecified osteoarthritis type, unspecified site Assessment & Plan:  Knees in past.   Plans to restart water exercise.   Other orders -     Semaglutide (1 MG/DOSE); Inject 1 mg as directed once a week.  Dispense: 3 mL; Refill: 11    Return in about 3 months (around 03/14/2023) for diabetes follow up  with labs prior.   Eliezer Lofts, MD

## 2022-12-13 NOTE — Assessment & Plan Note (Signed)
Does not have a CPAP machine. No fatigue, no AM headaches.

## 2022-12-13 NOTE — Assessment & Plan Note (Addendum)
Knees in past.   Plans to restart water exercise.

## 2022-12-13 NOTE — Assessment & Plan Note (Signed)
Stable, chronic.  Continue current medication.    

## 2022-12-13 NOTE — Assessment & Plan Note (Addendum)
Chronic, inadequate control.  Statin indicated given diagnosis of diabetes. Patient previously refused given fear of possible side effects despite need.  She is not  interested in statin at this time.  She is using red yeast rice.

## 2022-12-13 NOTE — Assessment & Plan Note (Addendum)
New recent new diagnosis, A1c improved from 10 down to 8 but not yet at goal.  Had side effects to metformin but did not titrate up.   Increase semaglutide to 1 mg weekly.

## 2022-12-13 NOTE — Assessment & Plan Note (Addendum)
Noted on ECHO in 2015 by Dr. Lenna Gilford.. referred to cardiology.  Repeat in  last 3 years per pt... was normal  ECHO at cardiology per pt.

## 2022-12-13 NOTE — Assessment & Plan Note (Signed)
Chronic intermittent. Has seen ENT, Dr. Tami Ribas in past

## 2022-12-13 NOTE — Assessment & Plan Note (Signed)
Encouraged exercise, weight loss, healthy eating habits. Had trial of GLP-1: Semaglutide

## 2022-12-13 NOTE — Patient Instructions (Addendum)
Increase semaglutide to 1 mg weekly. Increase red yeast rice to 1 capsule twice daily.  Bring in a copy of  normal ECHO.

## 2022-12-13 NOTE — Assessment & Plan Note (Signed)
Chronic, Controlled on Symbicort as needed, uses albuterol prn.

## 2022-12-29 ENCOUNTER — Telehealth: Payer: Self-pay | Admitting: Family Medicine

## 2022-12-29 NOTE — Telephone Encounter (Signed)
EKG and article on statin drug placed in Dr. Rometta Emery office in box to review.

## 2022-12-29 NOTE — Telephone Encounter (Signed)
Pt came in office to drop off paperwork for PCP review . It was place in PCP folder

## 2023-01-06 ENCOUNTER — Telehealth: Payer: Self-pay

## 2023-01-06 NOTE — Progress Notes (Signed)
Care Management & Coordination Services Pharmacy Team  Reason for Encounter: Appointment Reminder  Contacted patient to confirm telephone appointment with Charlene Brooke , PharmD on 01/11/23 at 3:45. Unsuccessful outreach. Left voicemail for patient to return call.   Have you seen any other providers since your last visit with PCP? No   Hospital visits:  None in previous 6 months   Star Rating Drugs:  Medication:  Last Fill: Day Supply Ozempic  12/13/22 28    Care Gaps: Annual wellness visit in last year? Yes  If Diabetic: Last eye exam / retinopathy screening:never done  Last diabetic foot exam:UTD   Charlene Brooke, PharmD notified  Avel Sensor, [place] Assistant [phone]

## 2023-01-11 ENCOUNTER — Encounter: Payer: Medicare HMO | Admitting: Pharmacist

## 2023-01-11 ENCOUNTER — Telehealth: Payer: Self-pay | Admitting: Pharmacist

## 2023-01-11 NOTE — Telephone Encounter (Signed)
Care Management & Coordination Services Outreach Note  01/11/2023 Name: Carrie Perkins MRN: [id number] DOB: 1950/12/21  Referred by: Jinny Sanders, MD  Patient had a phone appointment scheduled with clinical pharmacist today.  An unsuccessful telephone outreach was attempted today. The patient was referred to the pharmacist for assistance with medications, care management and care coordination.   Patient will NOT be penalized in any way for missing a Care Management & Coordination Services appointment. The no-show fee does not apply.  If possible, a message was left to return call to: [phone] or to [facility].  Charlene Brooke, PharmD, BCACP Clinical Pharmacist [facility] at [facility] [phone]

## 2023-01-11 NOTE — Progress Notes (Unsigned)
Care Management & Coordination Services Pharmacy Note  01/11/2023 Name:  Carrie Perkins MRN:  [id number] DOB:  1951/08/22  Summary: -DM: A1c 10.1% (07/2022) - new diagnosis. Pt reports doing well with Ozempic 0.5 mg and fasting BG has been in 120s (highest 128 recently); she is due for eye exam -HLD: LDL 114 (07/2022), she is not on a statin; reviewed importance of statin for heart disease prevention now that she has diabetes, she declines to start a statin   Recommendations/Changes made from today's visit: -No med changes. Repeat A1c at [facility] in Jan. -Advised to schedule eye exam -offered [facility] exam but she wants to go to [facility]  Follow up plan: -[place] will call patient *** -Pharmacist follow up televisit scheduled for *** -PCP appt 03/14/23    Subjective: Carrie Perkins is an 72 y.o. year old female who is a primary patient of Bedsole, Amy E, MD.  The care coordination team was consulted for assistance with disease management and care coordination needs.    Engaged with patient by telephone for follow up visit.  Recent office visits: 12/13/22 Dr Diona Browner OV: TOC - A1c 8.0. Increase Ozempic to 1 mg. Discussed statin, pt declined.   Recent consult visits: 12/12/22 Dr Gerarda Fraction (Weight mgmt): initial visit  Hospital visits: None in previous 6 months   Objective:  Lab Results  Component Value Date   CREATININE 0.62 08/02/2022   BUN 9 08/02/2022   GFR 88.63 08/02/2022   GFRNONAA 102.19 05/11/2010   NA 137 08/02/2022   K 4.1 08/02/2022   CALCIUM 9.2 08/02/2022   CO2 29 08/02/2022   GLUCOSE 308 (H) 08/02/2022    Lab Results  Component Value Date/Time   HGBA1C 8.0 (A) 12/13/2022 03:53 PM   HGBA1C 10.1 (H) 08/02/2022 08:42 AM   HGBA1C 6.4 01/20/2016 10:00 AM   GFR 88.63 08/02/2022 08:42 AM   GFR 90.37 07/06/2021 09:24 AM   MICROALBUR <0.7 08/18/2022 09:16 AM    Last diabetic Eye exam: No results found for: "HMDIABEYEEXA"  Last diabetic Foot exam: No results found for:  "HMDIABFOOTEX"   Lab Results  Component Value Date   CHOL 201 (H) 08/02/2022   HDL 52.50 08/02/2022   LDLCALC 114 (H) 08/02/2022   LDLDIRECT 140.0 07/06/2021   TRIG 174.0 (H) 08/02/2022   CHOLHDL 4 08/02/2022       Latest Ref Rng & Units 08/02/2022    8:42 AM 07/06/2021    9:24 AM 01/20/2016   10:00 AM  Hepatic Function  Total Protein 6.0 - 8.3 g/dL 6.1  6.6  6.9   Albumin 3.5 - 5.2 g/dL 3.7  4.0  4.4   AST 0 - 37 U/L '14  18  18   '$ ALT 0 - 35 U/L '21  25  23   '$ Alk Phosphatase 39 - 117 U/L 78  71  61   Total Bilirubin 0.2 - 1.2 mg/dL 0.6  0.7  0.6   Bilirubin, Direct 0.0 - 0.3 mg/dL   0.1     Lab Results  Component Value Date/Time   TSH 1.73 08/02/2022 08:42 AM   TSH 0.93 01/20/2016 10:00 AM       Latest Ref Rng & Units 08/02/2022    8:42 AM 01/20/2016   10:00 AM 01/08/2014   10:14 AM  CBC  WBC 4.0 - 10.5 K/uL 5.7  6.5  7.5   Hemoglobin 12.0 - 15.0 g/dL 14.2  14.8  14.6   Hematocrit 36.0 - 46.0 % 41.1  42.2  43.6   Platelets 150.0 - 400.0 K/uL 272.0  284.0  316.0     Lab Results  Component Value Date/Time   VD25OH 36.13 08/02/2022 08:42 AM   VD25OH 14.79 (L) 01/20/2016 10:00 AM    Clinical ASCVD: {YES/NO:21197} The 10-year ASCVD risk score (Arnett DK, et al., 2019) is: 18.3%   Values used to calculate the score:     Age: 16 years     Sex: Female     Is Non-Hispanic African American: No     Diabetic: Yes     Tobacco smoker: No     Systolic Blood Pressure: [id number] mmHg     Is BP treated: No     HDL Cholesterol: 52.5 mg/dL     Total Cholesterol: 201 mg/dL    ***Other: (CHADS2VASc if Afib, MMRC or CAT for COPD, ACT, DEXA)     12/13/2022    3:54 PM 08/02/2022    7:55 AM 05/16/2022   10:04 AM  Depression screen PHQ 2/9  Decreased Interest 0 0 0  Down, Depressed, Hopeless 0 0 0  PHQ - 2 Score 0 0 0  Altered sleeping  0   Tired, decreased energy  0   Change in appetite  0   Feeling bad or failure about yourself   0   Trouble concentrating  0   Moving slowly or  fidgety/restless  0   Suicidal thoughts  0   PHQ-9 Score  0   Difficult doing work/chores  Not difficult at all      Social History   Tobacco Use  Smoking Status Never  Smokeless Tobacco Never   BP Readings from Last 3 Encounters:  12/13/22 110/70  12/12/22 133/72  10/11/22 124/70   Pulse Readings from Last 3 Encounters:  12/13/22 89  12/12/22 83  10/11/22 83   Wt Readings from Last 3 Encounters:  12/13/22 211 lb 4 oz (95.8 kg)  10/11/22 208 lb 12.8 oz (94.7 kg)  08/18/22 209 lb 6 oz (95 kg)   BMI Readings from Last 3 Encounters:  12/13/22 35.15 kg/m  12/12/22 34.75 kg/m  10/11/22 33.70 kg/m    Allergies  Allergen Reactions   Amoxicillin-Pot Clavulanate     REACTION: severe abd pain---intolerant   Biaxin [Clarithromycin] Other (See Comments)    Caused severe stomach pain     Medications Reviewed Today     Reviewed by Ronaldo Miyamoto, CMA (Certified Medical Assistant) on 12/12/22 at [place] List Status: <None>   Medication Order Taking? Sig Documenting Provider Last Dose Status Informant  albuterol (VENTOLIN HFA) 108 (90 Base) MCG/ACT inhaler [id number]  INHALE 1-2 PUFFS BY MOUTH EVERY 6 HOURS AS NEEDED FOR WHEEZE OR SHORTNESS OF BREATH Parrett, Tammy S, NP  Active   aspirin EC 81 MG tablet [id number]  Take 81 mg by mouth daily. [provider]  Active   azelastine (ASTELIN) 0.1 % nasal spray [id number]  Place 1 spray into both nostrils 2 (two) times daily. Use in each nostril as directed Parrett, Tammy S, NP  Active   Beclomethasone Dipropionate (QNASL) 80 MCG/ACT AERS [id number]  Place 2 sprays into the nose at bedtime. Parrett, Fonnie Mu, NP  Active   benzonatate (TESSALON) 200 MG capsule [id number]  Take 1 capsule (200 mg total) by mouth 3 (three) times daily as needed for cough. Parrett, Fonnie Mu, NP  Active   blood glucose meter kit and supplies [id number]  Dispense based on patient and insurance preference. Use up  to four times daily as directed. (FOR  ICD-10 E11.9). Jinny Sanders, MD  Active   budesonide-formoterol [facility]) 160-4.5 MCG/ACT inhaler [id number]  Inhale 2 puffs into the lungs 2 (two) times daily. Parrett, Fonnie Mu, NP  Active   cetirizine (ZYRTEC) 10 MG chewable tablet [id number]  Chew 10 mg by mouth 2 (two) times daily. [provider]  Active   Cholecalciferol 1000 UNITS capsule [id number]  Take 1,000 Units by mouth daily. [provider]  Active   dextromethorphan-guaiFENesin [facility]) 30-600 MG per 12 hr tablet [id number]  Take 2 tablets by mouth every 12 (twelve) hours. [provider]  Active   Glucose Blood (BLOOD GLUCOSE TEST STRIPS) STRP [id number]  Test blood sugar as directed. (FOR ICD-10 E11.9). Jinny Sanders, MD  Active   Microlet Lancets MISC [id number]  Use to test blood sugar as directed. (FOR ICD-10 E11.9). Jinny Sanders, MD  Active   Baruch Gouty [id number]  doterra essential oil Mixes in water [provider]  Active   predniSONE (DELTASONE) 20 MG tablet [id number]  Take 1 tablet (20 mg total) by mouth daily with breakfast. Parrett, Tammy S, NP  Active   Red Yeast Rice 600 MG CAPS [id number]  Take 1 capsule by mouth daily. [provider]  Active   Semaglutide,0.25 or 0.'5MG'$ /DOS, (OZEMPIC, 0.25 OR 0.5 MG/DOSE,) 2 MG/3ML SOPN [id number]  INJECT 0.5 MG INTO THE SKIN ONE TIME PER WEEK Diona Browner, Amy E, MD  Active   Turmeric (QC TUMERIC COMPLEX PO) [id number]  Take by mouth. [provider]  Active   Zinc Sulfate (ZINC 15 PO) [id number]  Take by mouth. Not sure of the dose [provider]  Active             SDOH:  (Social Determinants of Health) assessments and interventions performed: {yes/no:20286} SDOH Interventions    Flowsheet Row Chronic Care Management from 09/12/2022 in [place] at [place] Interventions   Transportation Interventions Intervention Not Indicated  Financial Strain Interventions Intervention Not  Indicated       Medication Assistance: {MEDASSISTANCEINFO:25044}  Medication Access: Within the past 30 days, how often has patient missed a dose of medication? *** Is a pillbox or other method used to improve adherence? {YES/NO:21197} Factors that may affect medication adherence? {CHL DESC; BARRIERS:21522} Are meds synced by current pharmacy? {YES/NO:21197} Are meds delivered by current pharmacy? {YES/NO:21197} Does patient experience delays in picking up medications due to transportation concerns? {YES/NO:21197}  [facility] Reviewed: Is patient disadvantaged to use [facility]?: {YES/NO:21197} Current Rx insurance plan: *** Name and location of Current pharmacy:  [id number] [place]Phone: [phone]: [phone] [facility] services reviewed with patient today?: {YES/NO:21197} Patient requests to transfer care to [facility]?: {YES/NO:21197} Reason patient declined to change pharmacies: {US patient preference:27474}  Compliance/Adherence/Medication fill history: Care Gaps: ***  Star-Rating Drugs: ***   Assessment/Plan   Hyperlipidemia: (LDL goal < 70) -Uncontrolled - LDL 114 (07/2022) -Current treatment: Red yeast rice - Query appropriate Aspirin 81 mg daily - Appropriate, Effective, Safe, Accessible -Medications previously tried: simvastatin  -Educated on Cholesterol goals; Benefits of statin for ASCVD risk reduction; role of statin therapy in someone with diabetes -Recommend to start statin therapy given recent DM diagnosis - pt declined. Reassess periodically  Diabetes (A1c goal <7%) -Uncontrolled, improving - A1c 8.9 (11/2022) improved from 10.1% (07/2022 - new Dx); pt is doing  well with Ozempic -Meal patterns: pt eats "on the go" a lot, she rarely has time to prepare foods; she reports appetite is reduced since starting Ozempic -Fasting BG: 120s; highest  128 -Current medications: Ozempic 1 mg weekly - Appropriate, Effective, Safe, Accessible -Medications previously tried: metformin (1500 mg starting dose - GI upset; pt declined to restart at lower dose)  -Educated on A1c and blood sugar goals;Benefits of routine self-monitoring of blood sugar; CGM - not covered by her insurance -Discussed eye exam, pt is trying to schedule appt with [facility] eye doctor -Recommend to continue current medication; repeat A1c at [place] in January  Health Maintenance -Vaccine gaps: Shingrix, TDAP   Charlene Brooke, PharmD, BCACP Clinical Pharmacist [facility] at [facility] [phone]

## 2023-03-06 ENCOUNTER — Telehealth: Payer: Self-pay | Admitting: Family Medicine

## 2023-03-06 NOTE — Telephone Encounter (Signed)
Patient is scheduled for labs prior to her d/m f/u appointment. On tomorrow 03/07/2023.She called in today asking would it still be okay to come have those labs done,due to her taking cough medicine for a cold that she has now with chest congestion?She said that she do know that the cough syrup may effect her blood sugar levels.Please advise.

## 2023-03-07 ENCOUNTER — Other Ambulatory Visit (INDEPENDENT_AMBULATORY_CARE_PROVIDER_SITE_OTHER): Payer: Medicare HMO

## 2023-03-07 DIAGNOSIS — E1169 Type 2 diabetes mellitus with other specified complication: Secondary | ICD-10-CM | POA: Diagnosis not present

## 2023-03-07 LAB — COMPREHENSIVE METABOLIC PANEL
ALT: 22 U/L (ref 0–35)
AST: 19 U/L (ref 0–37)
Albumin: 4.1 g/dL (ref 3.5–5.2)
Alkaline Phosphatase: 60 U/L (ref 39–117)
BUN: 9 mg/dL (ref 6–23)
CO2: 26 mEq/L (ref 19–32)
Calcium: 9 mg/dL (ref 8.4–10.5)
Chloride: 102 mEq/L (ref 96–112)
Creatinine, Ser: 0.66 mg/dL (ref 0.40–1.20)
GFR: 86.94 mL/min (ref >60.00)
Glucose, Bld: 201 mg/dL — ABNORMAL HIGH (ref 70–99)
Potassium: 3.9 mEq/L (ref 3.5–5.1)
Sodium: 137 mEq/L (ref 135–145)
Total Bilirubin: 0.6 mg/dL (ref 0.2–1.2)
Total Protein: 6.9 g/dL (ref 6.0–8.3)

## 2023-03-07 LAB — LIPID PANEL
Cholesterol: 147 mg/dL (ref 0–200)
HDL: 36.1 mg/dL — ABNORMAL LOW (ref >39.00)
LDL Cholesterol: 82 mg/dL (ref 0–99)
NonHDL: 110.61
Total CHOL/HDL Ratio: 4
Triglycerides: 141 mg/dL (ref 0.0–149.0)
VLDL: 28.2 mg/dL (ref 0.0–40.0)

## 2023-03-07 LAB — HEMOGLOBIN A1C: Hgb A1c MFr Bld: 7.4 % — ABNORMAL HIGH (ref 4.6–6.5)

## 2023-03-07 NOTE — Telephone Encounter (Signed)
Labs have already been drawn earlier this morning.   Her appointment was today at 8:45 am.

## 2023-03-07 NOTE — Telephone Encounter (Signed)
It is okay to proceed with the labs.  Just have her not take the cough medicine for 4 hours prior to labs

## 2023-03-07 NOTE — Progress Notes (Signed)
No critical labs need to be addressed urgently. We will discuss labs in detail at upcoming office visit.   

## 2023-03-14 ENCOUNTER — Ambulatory Visit (INDEPENDENT_AMBULATORY_CARE_PROVIDER_SITE_OTHER): Payer: Medicare HMO | Admitting: Family Medicine

## 2023-03-14 ENCOUNTER — Encounter: Payer: Self-pay | Admitting: Family Medicine

## 2023-03-14 VITALS — BP 110/64 | HR 87 | Temp 98.0°F | Ht 65.0 in | Wt 210.0 lb

## 2023-03-14 DIAGNOSIS — J4541 Moderate persistent asthma with (acute) exacerbation: Secondary | ICD-10-CM | POA: Diagnosis not present

## 2023-03-14 DIAGNOSIS — E1169 Type 2 diabetes mellitus with other specified complication: Secondary | ICD-10-CM | POA: Diagnosis not present

## 2023-03-14 DIAGNOSIS — E785 Hyperlipidemia, unspecified: Secondary | ICD-10-CM

## 2023-03-14 MED ORDER — PREDNISONE 10 MG PO TABS: ORAL_TABLET | ORAL | 0 refills | Status: DC

## 2023-03-14 MED ORDER — SEMAGLUTIDE (2 MG/DOSE) 8 MG/3ML ~~LOC~~ SOPN: 2.0000 mg | PEN_INJECTOR | SUBCUTANEOUS | 3 refills | Status: DC

## 2023-03-14 MED ORDER — AZITHROMYCIN 250 MG PO TABS: ORAL_TABLET | ORAL | 0 refills | Status: AC

## 2023-03-14 MED ORDER — ALBUTEROL SULFATE HFA 108 (90 BASE) MCG/ACT IN AERS: INHALATION_SPRAY | RESPIRATORY_TRACT | 1 refills | Status: DC

## 2023-03-14 NOTE — Assessment & Plan Note (Signed)
Chronic, significant improvement with increasing semaglutide.  Minimal weight loss. She has been working aggressively on low carbohydrate diet.  Given A1c not yet less than 7 we will increase semaglutide to 2 mg weekly. Reevaluate in 3 months with point-of-care A1c.

## 2023-03-14 NOTE — Assessment & Plan Note (Signed)
Chronic, dramatic improvement with cholesterol with increase in red yeast rice to 600 mg p.o. twice daily.  She continues to strongly decline statin medication despite need.   Continue healthy lifestyle changes.

## 2023-03-14 NOTE — Progress Notes (Signed)
Patient ID: Carrie Perkins, female    DOB: 1951-08-30, 72 y.o.   MRN: [id number]  This visit was conducted in person.  BP 110/64   Pulse 87   Temp 98 F (36.7 C) (Temporal)   Ht 5\' 5"  (1.651 m)   Wt 210 lb (95.3 kg)   SpO2 98%   BMI 34.95 kg/m    CC:  Chief Complaint  Patient presents with   Diabetes   Cough    X 3 weeks   Nasal Congestion    Subjective:   HPI: Carrie Perkins is a 72 y.o. female presenting on 03/14/2023 for Diabetes, Cough (X 3 weeks), and Nasal Congestion  Previous PCP: Selena Batten Last AMW/CPX: 05/16/2022 and 08/02/22 repectively    New onset  3 weeks ago.. started with nasal congestion, pressure in face, no fevers, no chills.  Moved to productive cough of yellow mucus.  No ear pain.  Using Zyrtec and Mucinex.  Using Symbicort and albuterol.  Wheezing and SOB off and on.   Diabetes, new diagnosis 08/2022  She has been working on healthy eating. Lab Results  Component Value Date   HGBA1C 7.4 (H) 03/07/2023  Started on metformin 500 mg daily with up titration to 1000 mg twice daily with food... has SE so stopped. Referral to diabetes education placed.  She has tolerated  semaglutide 1 mg... was not refilled in last 2 weeks not sure what problem was.  NO SE. NO diarrhea, no abdomina pain.  No low blood sugars.  Wt Readings from Last 3 Encounters:  03/14/23 210 lb (95.3 kg)  12/13/22 211 lb 4 oz (95.8 kg)  10/11/22 208 lb 12.8 oz (94.7 kg)     She limits carbs, ozempic has decreased her appetite.  She  no longer exercises since COVID.,. plan to restart.   Hyperlipidemia, not on statin.  LDL  NOW at goal less than 100 on red yeast rice 600 mg BID. Lab Results  Component Value Date   CHOL 147 03/07/2023   HDL 36.10 (L) 03/07/2023   LDLCALC 82 03/07/2023   LDLDIRECT 140.0 07/06/2021   TRIG 141.0 03/07/2023   CHOLHDL 4 03/07/2023   Has seen Dr. Okey Dupre [facility] 12/06/21 for abnormal EKG, Per that note.. nml ECHO and nuclear stress test  normal.  Relevant past medical, surgical, family and social history reviewed and updated as indicated. Interim medical history since our last visit reviewed. Allergies and medications reviewed and updated. Outpatient Medications Prior to Visit  Medication Sig Dispense Refill   aspirin EC 81 MG tablet Take 81 mg by mouth daily.     azelastine (ASTELIN) 0.1 % nasal spray Place 1 spray into both nostrils 2 (two) times daily as needed for rhinitis. Use in each nostril as directed     Beclomethasone Dipropionate (QNASL) 80 MCG/ACT AERS Place 2 each into the nose at bedtime as needed.     benzonatate (TESSALON) 200 MG capsule Take 1 capsule (200 mg total) by mouth 3 (three) times daily as needed for cough. 45 capsule 3   blood glucose meter kit and supplies Dispense based on patient and insurance preference. Use up to four times daily as directed. (FOR ICD-10 E11.9). 1 each 0   budesonide-formoterol (SYMBICORT) 160-4.5 MCG/ACT inhaler Inhale 2 puffs into the lungs 2 (two) times daily as needed.     cetirizine (ZYRTEC) 10 MG chewable tablet Chew 10 mg by mouth 2 (two) times daily.     Cholecalciferol 1000 UNITS capsule Take  1,000 Units by mouth daily.     dextromethorphan-guaiFENesin (MUCINEX DM) 30-600 MG per 12 hr tablet Take 2 tablets by mouth every 12 (twelve) hours.     Glucose Blood (BLOOD GLUCOSE TEST STRIPS) STRP Test blood sugar as directed. (FOR ICD-10 E11.9). 100 strip 3   Microlet Lancets MISC Use to test blood sugar as directed. (FOR ICD-10 E11.9). 100 each 3   NON FORMULARY doterra essential oil Mixes in water     Red Yeast Rice 600 MG CAPS Take 1 capsule by mouth daily.     Turmeric (QC TUMERIC COMPLEX PO) Take 1 tablet by mouth daily.     Zinc Sulfate (ZINC 15 PO) Take 1 tablet by mouth daily. Not sure of the dose     albuterol (VENTOLIN HFA) 108 (90 Base) MCG/ACT inhaler INHALE 1-2 PUFFS BY MOUTH EVERY 6 HOURS AS NEEDED FOR WHEEZE OR SHORTNESS OF BREATH 6.7 each 1   Semaglutide, 1  MG/DOSE, 4 MG/3ML SOPN Inject 1 mg as directed once a week. 3 mL 11   No facility-administered medications prior to visit.     Per HPI unless specifically indicated in ROS section below Review of Systems  Constitutional:  Negative for fatigue and fever.  HENT:  Negative for congestion.   Eyes:  Negative for pain.  Respiratory:  Negative for cough and shortness of breath.   Cardiovascular:  Negative for chest pain, palpitations and leg swelling.  Gastrointestinal:  Negative for abdominal pain.  Genitourinary:  Negative for dysuria and vaginal bleeding.  Musculoskeletal:  Negative for back pain.  Neurological:  Negative for syncope, light-headedness and headaches.  Psychiatric/Behavioral:  Negative for dysphoric mood.    Objective:  BP 110/64   Pulse 87   Temp 98 F (36.7 C) (Temporal)   Ht 5\' 5"  (1.651 m)   Wt 210 lb (95.3 kg)   SpO2 98%   BMI 34.95 kg/m   Wt Readings from Last 3 Encounters:  03/14/23 210 lb (95.3 kg)  12/13/22 211 lb 4 oz (95.8 kg)  10/11/22 208 lb 12.8 oz (94.7 kg)      Physical Exam Constitutional:      General: She is not in acute distress.    Appearance: Normal appearance. She is well-developed. She is not ill-appearing or toxic-appearing.  HENT:     Head: Normocephalic.     Right Ear: Hearing, tympanic membrane, ear canal and external ear normal. Tympanic membrane is not erythematous, retracted or bulging.     Left Ear: Hearing, tympanic membrane, ear canal and external ear normal. Tympanic membrane is not erythematous, retracted or bulging.     Nose: No mucosal edema or rhinorrhea.     Right Sinus: No maxillary sinus tenderness or frontal sinus tenderness.     Left Sinus: No maxillary sinus tenderness or frontal sinus tenderness.     Mouth/Throat:     Pharynx: Uvula midline.  Eyes:     General: Lids are normal. Lids are everted, no foreign bodies appreciated.     Conjunctiva/sclera: Conjunctivae normal.     Pupils: Pupils are equal, round, and  reactive to light.  Neck:     Thyroid: No thyroid mass or thyromegaly.     Vascular: No carotid bruit.     Trachea: Trachea normal.  Cardiovascular:     Rate and Rhythm: Normal rate and regular rhythm.     Pulses: Normal pulses.     Heart sounds: Normal heart sounds, S1 normal and S2 normal. No murmur heard.  No friction rub. No gallop.  Pulmonary:     Effort: Pulmonary effort is normal. No tachypnea or respiratory distress.     Breath sounds: Wheezing present. No decreased breath sounds, rhonchi or rales.     Comments: Scattered Abdominal:     General: Bowel sounds are normal.     Palpations: Abdomen is soft.     Tenderness: There is no abdominal tenderness.  Musculoskeletal:     Cervical back: Normal range of motion and neck supple.  Skin:    General: Skin is warm and dry.     Findings: No rash.  Neurological:     Mental Status: She is alert.  Psychiatric:        Mood and Affect: Mood is not anxious or depressed.        Speech: Speech normal.        Behavior: Behavior normal. Behavior is cooperative.        Thought Content: Thought content normal.        Judgment: Judgment normal.       Results for orders placed or performed in visit on 03/07/23  Comprehensive metabolic panel  Result Value Ref Range   Sodium 137 135 - 145 mEq/L   Potassium 3.9 3.5 - 5.1 mEq/L   Chloride 102 96 - 112 mEq/L   CO2 26 19 - 32 mEq/L   Glucose, Bld 201 (H) 70 - 99 mg/dL   BUN 9 6 - 23 mg/dL   Creatinine, Ser 1.61 0.40 - 1.20 mg/dL   Total Bilirubin 0.6 0.2 - 1.2 mg/dL   Alkaline Phosphatase 60 39 - 117 U/L   AST 19 0 - 37 U/L   ALT 22 0 - 35 U/L   Total Protein 6.9 6.0 - 8.3 g/dL   Albumin 4.1 3.5 - 5.2 g/dL   GFR 09.60 >45.40 mL/min   Calcium 9.0 8.4 - 10.5 mg/dL  Lipid panel  Result Value Ref Range   Cholesterol 147 0 - 200 mg/dL   Triglycerides 981.1 0.0 - 149.0 mg/dL   HDL 91.47 (L) >82.95 mg/dL   VLDL 62.1 0.0 - 30.8 mg/dL   LDL Cholesterol 82 0 - 99 mg/dL   Total  CHOL/HDL Ratio 4    NonHDL 110.61   Hemoglobin A1c  Result Value Ref Range   Hgb A1c MFr Bld 7.4 (H) 4.6 - 6.5 %    Assessment and Plan  Type 2 diabetes mellitus with other specified complication, without long-term current use of insulin (HCC) Assessment & Plan: Chronic, significant improvement with increasing semaglutide.  Minimal weight loss. She has been working aggressively on low carbohydrate diet.  Given A1c not yet less than 7 we will increase semaglutide to 2 mg weekly. Reevaluate in 3 months with point-of-care A1c.   Moderate persistent asthma with exacerbation Assessment & Plan: Acute, ongoing greater than 3 weeks, likely initial viral or allergy etiology now with possible bacterial superinfection. Will treat with prednisone taper and azithromycin course.  Refilled albuterol 2 puffs inhaled every 4-6 hours as needed.  ER and return precautions provided.   Hyperlipidemia associated with type 2 diabetes mellitus (HCC) Assessment & Plan: Chronic, dramatic improvement with cholesterol with increase in red yeast rice to 600 mg p.o. twice daily.  She continues to strongly decline statin medication despite need.   Continue healthy lifestyle changes.   Other orders -     Semaglutide (2 MG/DOSE); Inject 2 mg as directed once a week.  Dispense: 9 mL; Refill: 3 -  predniSONE; 3 tabs by mouth daily x 3 days, then 2 tabs by mouth daily x 2 days then 1 tab by mouth daily x 2 days  Dispense: 15 tablet; Refill: 0 -     Azithromycin; Take 2 tablets on day 1, then 1 tablet daily on days 2 through 5  Dispense: 6 tablet; Refill: 0 -     Albuterol Sulfate HFA; INHALE 1-2 PUFFS BY MOUTH EVERY 6 HOURS AS NEEDED FOR WHEEZE OR SHORTNESS OF BREATH  Dispense: 6.7 each; Refill: 1    Return in about 3 months (around 06/13/2023) for diabetes follow up POC A1C.   [name], MD

## 2023-03-14 NOTE — Patient Instructions (Signed)
Increase semaglutide to 2 mg weekly.

## 2023-03-14 NOTE — Assessment & Plan Note (Signed)
Acute, ongoing greater than 3 weeks, likely initial viral or allergy etiology now with possible bacterial superinfection. Will treat with prednisone taper and azithromycin course.  Refilled albuterol 2 puffs inhaled every 4-6 hours as needed.  ER and return precautions provided.

## 2023-05-10 ENCOUNTER — Other Ambulatory Visit: Payer: Self-pay | Admitting: Family Medicine

## 2023-05-23 ENCOUNTER — Telehealth (INDEPENDENT_AMBULATORY_CARE_PROVIDER_SITE_OTHER): Payer: Medicare HMO | Admitting: Family Medicine

## 2023-05-23 ENCOUNTER — Encounter: Payer: Self-pay | Admitting: Family Medicine

## 2023-05-23 VITALS — Ht 65.0 in | Wt 201.0 lb

## 2023-05-23 DIAGNOSIS — U071 COVID-19: Secondary | ICD-10-CM | POA: Diagnosis not present

## 2023-05-23 MED ORDER — NIRMATRELVIR/RITONAVIR (PAXLOVID)TABLET: 3.0000 | ORAL_TABLET | Freq: Two times a day (BID) | ORAL | 0 refills | Status: AC

## 2023-05-23 NOTE — Assessment & Plan Note (Signed)
COVID19  infection. No clear sign of bacterial infection at this time. Mild to moderate symptoms on day 4 of illness. No SOB.  No red flags/need for ER visit or in-person exam at respiratory clinic at this time..    Pt high risk for COVID complications given   asthma, DM, heart history,obesity and age.  Discussed options to treat COVID including  Antivirals .  GFR >60  Reviewed interactions of paxlovid and her medicaitons with patient  In detail.Marland Kitchen she will hold or  adjust dose of medications as needed. No absolute contraindications to paxlovid noted... she will hold red yeast rice. If SOB begins symptoms worsening.. have low threshold for in-person exam, if severe shortness of breath ER visit recommended.   Can monitor Oxygen saturation at home with home monitor if able to obtain.  Go to ER if O2 sat < 90% on room air.  Reviewed home care and provided information through MyChart.  Recommended quarantine until test returns. If returns positive 5 days isolation recommended. Return to work day 6 and wear mask for 4 more days to complete 10 days. Provided info about prevention of spread of COVID 19.

## 2023-05-23 NOTE — Progress Notes (Signed)
VIRTUAL VISIT A virtual visit is felt to be most appropriate for this patient at this time.   I connected with the patient on 05/23/23 at 10:40 AM EDT by virtual telehealth platform and verified that I am speaking with the correct person using two identifiers.   I discussed the limitations, risks, security and privacy concerns of performing an evaluation and management service by  virtual telehealth platform and the availability of in person appointments. I also discussed with the patient that there may be a patient responsible charge related to this service. The patient expressed understanding and agreed to proceed.  Patient location: Home Provider Location: [facility] Participants: [name] and [name]   Chief Complaint  Patient presents with   Covid Positive    Positive Test on Sunday Morning Symptoms started Saturday Morning   Sore Throat   Headache   Cough   Nasal Congestion    History of Present Illness:  72 y.o. female patient of Carrie Perkins E, MD presents with  new onset COVID19.   Date of onset: 4 Initial symptoms included ST, HA Symptoms progressed to , nasal congestion and  productive cough  NO SOB, no wheeze.   Possible low grade fever.. sweating.   Sick contacts:  COVID testing:   positive day of illness     She has tried to treat with  Tyelnol cold and ST.  Tessalon Perles, Mucinex  and Zyrtec     Has history of  DM, HFpEF, LVH, OSA and moderate persistent asthma Non-smoker.    COVID 19 screen No recent travel or known exposure to COVID19 The patient denies respiratory symptoms of COVID 19 at this time.  The importance of social distancing was discussed today.   Review of Systems  Constitutional:  Negative for chills and fever.  HENT:  Positive for congestion. Negative for ear pain.   Eyes:  Negative for pain and redness.  Respiratory:  Positive for cough. Negative for shortness of breath.   Cardiovascular:  Negative for chest  pain, palpitations and leg swelling.  Gastrointestinal:  Negative for abdominal pain, blood in stool, constipation, diarrhea, nausea and vomiting.  Genitourinary:  Negative for dysuria.  Musculoskeletal:  Negative for falls and myalgias.  Skin:  Negative for rash.  Neurological:  Negative for dizziness.  Psychiatric/Behavioral:  Negative for depression. The patient is not nervous/anxious.       Past Medical History:  Diagnosis Date   Allergic rhinitis, cause unspecified    Obstructive sleep apnea (adult) (pediatric)    Osteoarthrosis, unspecified whether generalized or localized, unspecified site    Other and unspecified hyperlipidemia    Other dyspnea and respiratory abnormality    Overweight(278.02)    Unspecified asthma(493.90)    Unspecified urinary incontinence     reports that she has never smoked. She has never used smokeless tobacco. She reports current alcohol use. She reports that she does not use drugs.   Current Outpatient Medications:    albuterol (VENTOLIN HFA) 108 (90 Base) MCG/ACT inhaler, INHALE 1-2 PUFFS BY MOUTH EVERY 6 HOURS AS NEEDED FOR WHEEZE OR SHORTNESS OF BREATH, Disp: 18 each, Rfl: 2   aspirin EC 81 MG tablet, Take 81 mg by mouth daily., Disp: , Rfl:    azelastine (ASTELIN) 0.1 % nasal spray, Place 1 spray into both nostrils 2 (two) times daily as needed for rhinitis. Use in each nostril as directed, Disp: , Rfl:    Beclomethasone Dipropionate (QNASL) 80 MCG/ACT AERS, Place 2 each  into the nose at bedtime as needed., Disp: , Rfl:    benzonatate (TESSALON) 200 MG capsule, Take 1 capsule (200 mg total) by mouth 3 (three) times daily as needed for cough., Disp: 45 capsule, Rfl: 3   blood glucose meter kit and supplies, Dispense based on patient and insurance preference. Use up to four times daily as directed. (FOR ICD-10 E11.9)., Disp: 1 each, Rfl: 0   budesonide-formoterol (SYMBICORT) 160-4.5 MCG/ACT inhaler, Inhale 2 puffs into the lungs 2 (two) times daily as  needed., Disp: , Rfl:    cetirizine (ZYRTEC) 10 MG chewable tablet, Chew 10 mg by mouth 2 (two) times daily., Disp: , Rfl:    Cholecalciferol 1000 UNITS capsule, Take 1,000 Units by mouth daily., Disp: , Rfl:    dextromethorphan-guaiFENesin (MUCINEX DM) 30-600 MG per 12 hr tablet, Take 2 tablets by mouth every 12 (twelve) hours., Disp: , Rfl:    Glucose Blood (BLOOD GLUCOSE TEST STRIPS) STRP, Test blood sugar as directed. (FOR ICD-10 E11.9)., Disp: 100 strip, Rfl: 3   Microlet Lancets MISC, Use to test blood sugar as directed. (FOR ICD-10 E11.9)., Disp: 100 each, Rfl: 3   nirmatrelvir/ritonavir (PAXLOVID) 20 x 150 MG & 10 x 100MG  TABS, Take 3 tablets by mouth 2 (two) times daily for 5 days. (Take nirmatrelvir 150 mg two tablets twice daily for 5 days and ritonavir 100 mg one tablet twice daily for 5 days) Patient GFR is >60, Disp: 30 tablet, Rfl: 0   NON FORMULARY, doterra essential oil Mixes in water, Disp: , Rfl:    predniSONE (DELTASONE) 10 MG tablet, 3 tabs by mouth daily x 3 days, then 2 tabs by mouth daily x 2 days then 1 tab by mouth daily x 2 days, Disp: 15 tablet, Rfl: 0   Red Yeast Rice 600 MG CAPS, Take 1 capsule by mouth daily., Disp: , Rfl:    Semaglutide, 2 MG/DOSE, 8 MG/3ML SOPN, Inject 2 mg as directed once a week., Disp: 9 mL, Rfl: 3   Turmeric (QC TUMERIC COMPLEX PO), Take 1 tablet by mouth daily., Disp: , Rfl:    Zinc Sulfate (ZINC 15 PO), Take 1 tablet by mouth daily. Not sure of the dose, Disp: , Rfl:    Observations/Objective: Height 5\' 5"  (1.651 m), weight 201 lb (91.2 kg).  Physical Exam Constitutional:      General: The patient is not in acute distress. Pulmonary:     Effort: Pulmonary effort is normal. No respiratory distress.  Neurological:     Mental Status: The patient is alert and oriented to person, place, and time.  Psychiatric:        Mood and Affect: Mood normal.        Behavior: Behavior normal.    Assessment and Plan COVID-19 Assessment &  Plan: COVID19  infection. No clear sign of bacterial infection at this time. Mild to moderate symptoms on day 4 of illness. No SOB.  No red flags/need for ER visit or in-person exam at respiratory clinic at this time..    Pt high risk for COVID complications given   asthma, DM, heart history,obesity and age.  Discussed options to treat COVID including  Antivirals .  GFR >60  Reviewed interactions of paxlovid and her medicaitons with patient  In detail.Marland Kitchen she will hold or  adjust dose of medications as needed. No absolute contraindications to paxlovid noted... she will hold red yeast rice. If SOB begins symptoms worsening.. have low threshold for in-person exam, if severe shortness  of breath ER visit recommended.   Can monitor Oxygen saturation at home with home monitor if able to obtain.  Go to ER if O2 sat < 90% on room air.  Reviewed home care and provided information through MyChart.  Recommended quarantine until test returns. If returns positive 5 days isolation recommended. Return to work day 6 and wear mask for 4 more days to complete 10 days. Provided info about prevention of spread of COVID 19.     Other orders -     nirmatrelvir/ritonavir; Take 3 tablets by mouth 2 (two) times daily for 5 days. (Take nirmatrelvir 150 mg two tablets twice daily for 5 days and ritonavir 100 mg one tablet twice daily for 5 days) Patient GFR is >60  Dispense: 30 tablet; Refill: 0      I discussed the assessment and treatment plan with the patient. The patient was provided an opportunity to ask questions and all were answered. The patient agreed with the plan and demonstrated an understanding of the instructions.   The patient was advised to call back or seek an in-person evaluation if the symptoms worsen or if the condition fails to improve as anticipated.     [name], MD

## 2023-05-25 ENCOUNTER — Ambulatory Visit (INDEPENDENT_AMBULATORY_CARE_PROVIDER_SITE_OTHER): Payer: Medicare HMO

## 2023-05-25 VITALS — Ht 66.0 in | Wt 201.0 lb

## 2023-05-25 DIAGNOSIS — Z0000 Encounter for general adult medical examination without abnormal findings: Secondary | ICD-10-CM

## 2023-05-25 DIAGNOSIS — Z1231 Encounter for screening mammogram for malignant neoplasm of breast: Secondary | ICD-10-CM

## 2023-05-25 NOTE — Progress Notes (Signed)
Subjective:   Carrie Perkins is a 72 y.o. female who presents for Medicare Annual (Subsequent) preventive examination.  Visit Complete: Virtual  I connected with  Carrie Perkins on 05/25/23 by a audio enabled telemedicine application and verified that I am speaking with the correct person using two identifiers.  Patient Location: Home  Provider Location: Home Office  I discussed the limitations of evaluation and management by telemedicine. The patient expressed understanding and agreed to proceed.  Patient Medicare AWV questionnaire was completed by the patient on n/a; I have confirmed that all information answered by patient is correct and no changes since this date.  Review of Systems     Cardiac Risk Factors include: advanced age (>42men, >63 women);dyslipidemia;hypertension;sedentary lifestyle;obesity (BMI >30kg/m2)     Objective:    Today's Vitals   05/25/23 0835  Weight: 201 lb (91.2 kg)  Height: 5\' 6"  (1.676 m)   Body mass index is 32.44 kg/m.     05/25/2023    8:35 AM 05/16/2022   10:03 AM  Advanced Directives  Does Patient Have a Medical Advance Directive? No Yes  Type of Special educational needs teacher of [place];Living will  Copy of Healthcare Power of Attorney in Chart?  No - copy requested  Would patient like information on creating a medical advance directive? No - Patient declined     Current Medications (verified) Outpatient Encounter Medications as of 05/25/2023  Medication Sig   albuterol (VENTOLIN HFA) 108 (90 Base) MCG/ACT inhaler INHALE 1-2 PUFFS BY MOUTH EVERY 6 HOURS AS NEEDED FOR WHEEZE OR SHORTNESS OF BREATH   aspirin EC 81 MG tablet Take 81 mg by mouth daily.   azelastine (ASTELIN) 0.1 % nasal spray Place 1 spray into both nostrils 2 (two) times daily as needed for rhinitis. Use in each nostril as directed   Beclomethasone Dipropionate (QNASL) 80 MCG/ACT AERS Place 2 each into the nose at bedtime as needed.   benzonatate (TESSALON) 200 MG  capsule Take 1 capsule (200 mg total) by mouth 3 (three) times daily as needed for cough.   blood glucose meter kit and supplies Dispense based on patient and insurance preference. Use up to four times daily as directed. (FOR ICD-10 E11.9).   budesonide-formoterol (SYMBICORT) 160-4.5 MCG/ACT inhaler Inhale 2 puffs into the lungs 2 (two) times daily as needed.   cetirizine (ZYRTEC) 10 MG chewable tablet Chew 10 mg by mouth 2 (two) times daily.   Cholecalciferol 1000 UNITS capsule Take 1,000 Units by mouth daily.   dextromethorphan-guaiFENesin (MUCINEX DM) 30-600 MG per 12 hr tablet Take 2 tablets by mouth every 12 (twelve) hours.   Glucose Blood (BLOOD GLUCOSE TEST STRIPS) STRP Test blood sugar as directed. (FOR ICD-10 E11.9).   Microlet Lancets MISC Use to test blood sugar as directed. (FOR ICD-10 E11.9).   nirmatrelvir/ritonavir (PAXLOVID) 20 x 150 MG & 10 x 100MG  TABS Take 3 tablets by mouth 2 (two) times daily for 5 days. (Take nirmatrelvir 150 mg two tablets twice daily for 5 days and ritonavir 100 mg one tablet twice daily for 5 days) Patient GFR is >60   NON FORMULARY doterra essential oil Mixes in water   predniSONE (DELTASONE) 10 MG tablet 3 tabs by mouth daily x 3 days, then 2 tabs by mouth daily x 2 days then 1 tab by mouth daily x 2 days   Red Yeast Rice 600 MG CAPS Take 1 capsule by mouth daily.   Semaglutide, 2 MG/DOSE, 8 MG/3ML SOPN Inject  2 mg as directed once a week.   Turmeric (QC TUMERIC COMPLEX PO) Take 1 tablet by mouth daily.   Zinc Sulfate (ZINC 15 PO) Take 1 tablet by mouth daily. Not sure of the dose   No facility-administered encounter medications on file as of 05/25/2023.    Allergies (verified) Amoxicillin-pot clavulanate and Biaxin [clarithromycin]   History: Past Medical History:  Diagnosis Date   Allergic rhinitis, cause unspecified    Obstructive sleep apnea (adult) (pediatric)    Osteoarthrosis, unspecified whether generalized or localized, unspecified site     Other and unspecified hyperlipidemia    Other dyspnea and respiratory abnormality    Overweight(278.02)    Unspecified asthma(493.90)    Unspecified urinary incontinence    Past Surgical History:  Procedure Laterality Date   left wrist ORIF after fall  2008   MEDIAL PARTIAL KNEE REPLACEMENT Right 05/2013   done at [facility]---Dr. Patrcia Dolly   TOTAL KNEE ARTHROPLASTY     TUBAL LIGATION     Family History  Problem Relation Age of Onset   Heart disease Father    Diabetes Father    Stroke Mother 93   Heart disease Mother    Atrial fibrillation Sister    Hyperlipidemia Sister    Hypertension Sister    Alcohol abuse Brother    Atrial fibrillation Brother    Stroke Maternal Grandmother 36   Social History   Socioeconomic History   Marital status: Divorced    Spouse name: Not on file   Number of children: 2   Years of education: college   Highest education level: Not on file  Occupational History   Occupation: [employer]  Tobacco Use   Smoking status: Never   Smokeless tobacco: Never  Vaping Use   Vaping status: Never Used  Substance and Sexual Activity   Alcohol use: Yes    Alcohol/week: 0.0 standard drinks of alcohol    Comment: rare-socially   Drug use: No   Sexual activity: Not Currently  Other Topics Concern   Not on file  Social History Narrative   10/03/19   From: [place], but moved when she was 72 yo   Living: alone   Work: Runs 2 tags/registration      Family: 2 Sons - Diplomatic Services operational officer and Magazine features editor - 1 grandchild      Enjoys: travel      Exercise: was working out 3 days a week, walks in the neighborhood   Diet: varies, a lot of salad and chicken, enjoys sweets, eating on the go         Safety   Seat belts: Yes    Guns: Yes  and secure   Safe in relationships: Yes    Social Determinants of Corporate investment banker Strain: Low Risk  (05/25/2023)   Overall Financial Resource Strain (CARDIA)    Difficulty of Paying Living Expenses: Not hard at all  Food Insecurity: No  Food Insecurity (05/25/2023)   Hunger Vital Sign    Worried About Running Out of Food in the Last Year: Never true    Ran Out of Food in the Last Year: Never true  Transportation Needs: No Transportation Needs (05/25/2023)   PRAPARE - Administrator, Civil Service (Medical): No    Lack of Transportation (Non-Medical): No  Physical Activity: Inactive (05/25/2023)   Exercise Vital Sign    Days of Exercise per Week: 0 days    Minutes of Exercise per Session: 0 min  Stress: No Stress Concern  Present (05/25/2023)   [employer] of Occupational Health - Occupational Stress Questionnaire    Feeling of Stress : Not at all  Social Connections: Patient Unable To Answer (05/25/2023)   Social Connection and Isolation Panel [NHANES]    Frequency of Communication with Friends and Family: Patient unable to answer    Frequency of Social Gatherings with Friends and Family: Patient unable to answer    Attends Religious Services: Patient unable to answer    Active Member of Clubs or Organizations: Patient unable to answer    Attends Banker Meetings: Patient unable to answer    Marital Status: Patient unable to answer    Tobacco Counseling Counseling given: Yes   Clinical Intake:  Pre-visit preparation completed: Yes  Pain : No/denies pain     BMI - recorded: 32.44 Nutritional Status: BMI > 30  Obese Nutritional Risks: None Diabetes: Yes CBG done?: No (telehealth visit) Did pt. bring in CBG monitor from home?: No  How often do you need to have someone help you when you read instructions, pamphlets, or other written materials from your doctor or pharmacy?: 1 - Never     Information entered by :: Abby Lavanya Roa, CMA   Activities of Daily Living    05/25/2023    8:40 AM 08/02/2022    7:56 AM  In your present state of health, do you have any difficulty performing the following activities:  Hearing? 1 1  Comment wears hearing aids   Vision? 0 0  Difficulty  concentrating or making decisions? 0 0  Walking or climbing stairs? 0 0  Dressing or bathing?  0  Doing errands, shopping? 0 0  Preparing Food and eating ? N   Using the Toilet? N   In the past six months, have you accidently leaked urine? N   Do you have problems with loss of bowel control? N   Managing your Medications? N   Managing your Finances? N   Housekeeping or managing your Housekeeping? N     Patient Care Team: Excell Seltzer, MD as PCP - General (Family Medicine) Kathyrn Sheriff, [facility] (Inactive) as Pharmacist (Pharmacist)  Indicate any recent Medical Services you may have received from other than [facility] providers in the past year (date may be approximate).     Assessment:   This is a routine wellness examination for Mar.  Hearing/Vision screen Hearing Screening - Comments:: Patient wears hearing aids   Dietary issues and exercise activities discussed:     Goals Addressed             This Visit's Progress    Patient Stated       Travel more and remain active and health and be able to continue to work       Depression Screen    05/25/2023    8:39 AM 12/13/2022    3:54 PM 08/02/2022    7:55 AM 05/16/2022   10:04 AM 10/03/2019    4:44 PM 01/08/2014    9:00 AM  PHQ 2/9 Scores  PHQ - 2 Score 0 0 0 0 0 0  PHQ- 9 Score   0       Fall Risk    05/25/2023    8:39 AM 12/13/2022    3:53 PM 08/02/2022    7:56 AM 05/16/2022   10:04 AM  Fall Risk   Falls in the past year? 1 0 0 0  Number falls in past yr: 0 0 0 0  Injury  with Fall? 0 0 0 0  Risk for fall due to : No Fall Risks No Fall Risks  Medication side effect  Follow up Education provided;Falls prevention discussed Falls evaluation completed  Falls evaluation completed;Education provided;Falls prevention discussed    MEDICARE RISK AT HOME:  Medicare Risk at Home - 05/25/23 1610     Any stairs in or around the home? No    If so, are there any without handrails? No    Home free of loose throw rugs  in walkways, pet beds, electrical cords, etc? Yes    Adequate lighting in your home to reduce risk of falls? Yes    Life alert? No    Use of a cane, walker or w/c? No    Grab bars in the bathroom? No    Shower chair or bench in shower? No    Elevated toilet seat or a handicapped toilet? No             TIMED UP AND GO:  Was the test performed?  No    Cognitive Function:        05/25/2023    8:39 AM 05/16/2022   10:06 AM  6CIT Screen  What Year? 0 points 0 points  What month? 0 points 0 points  What time? 0 points 0 points  Count back from 20 0 points 0 points  Months in reverse 0 points 0 points  Repeat phrase 0 points 0 points  Total Score 0 points 0 points    Immunizations Immunization History  Administered Date(s) Administered   Influenza Split 12/12/2011   Influenza Whole 08/14/2009   PFIZER Comirnaty(Gray Top)Covid-19 Tri-Sucrose Vaccine 08/28/2020   PFIZER(Purple Top)SARS-COV-2 Vaccination 12/20/2019, 01/14/2020   PNEUMOCOCCAL CONJUGATE-20 07/06/2021   Pneumococcal Polysaccharide-23 08/02/2022   Tdap 11/14/2010    TDAP status: Up to date  Flu Vaccine status: Up to date  Pneumococcal vaccine status: Up to date  Covid-19 vaccine status: Information provided on how to obtain vaccines.   Qualifies for Shingles Vaccine? Yes   Zostavax completed No   Shingrix Completed?: No.    Education has been provided regarding the importance of this vaccine. Patient has been advised to call insurance company to determine out of pocket expense if they have not yet received this vaccine. Advised may also receive vaccine at local pharmacy or Health Dept. Verbalized acceptance and understanding.  Screening Tests Health Maintenance  Topic Date Due   OPHTHALMOLOGY EXAM  Never done   Zoster Vaccines- Shingrix (1 of 2) Never done   DTaP/Tdap/Td (2 - Td or Tdap) 11/14/2020   INFLUENZA VACCINE  06/15/2023   Diabetic kidney evaluation - Urine ACR  08/19/2023   FOOT EXAM   08/19/2023   HEMOGLOBIN A1C  09/06/2023   Diabetic kidney evaluation - eGFR measurement  03/06/2024   MAMMOGRAM  05/09/2024   Medicare Annual Wellness (AWV)  05/24/2024   Colonoscopy  12/08/2024   Pneumonia Vaccine 27+ Years old  Completed   DEXA SCAN  Completed   HPV VACCINES  Aged Out   COVID-19 Vaccine  Discontinued   Hepatitis C Screening  Discontinued    Health Maintenance  Health Maintenance Due  Topic Date Due   OPHTHALMOLOGY EXAM  Never done   Zoster Vaccines- Shingrix (1 of 2) Never done   DTaP/Tdap/Td (2 - Td or Tdap) 11/14/2020    Colorectal cancer screening: Type of screening: Colonoscopy. Completed 12/09/2019. Repeat every 5 years  Mammogram status: Ordered 05/25/23. Pt provided with contact info and  advised to call to schedule appt.   Bone Density status: Completed 09/07/2016. Results reflect: Bone density results: OSTEOPENIA. Repeat every 5 years.  Lung Cancer Screening: (Low Dose CT Chest recommended if Age 70-80 years, 20 pack-year currently smoking OR have quit w/in 15years.) does not qualify.   Additional Screening:  Hepatitis C Screening: does not qualify; patient declined  Vision Screening: Recommended annual ophthalmology exams for early detection of glaucoma and other disorders of the eye. Is the patient up to date with their annual eye exam?  Yes  Who is the provider or what is the name of the office in which the patient attends annual eye exams? [facility] [place] If pt is not established with a provider, would they like to be referred to a provider to establish care? No .   Dental Screening: Recommended annual dental exams for proper oral hygiene  Diabetic Foot Exam: Diabetic Foot Exam: Completed 08/18/2022  Community Resource Referral / Chronic Care Management: CRR required this visit?  No   CCM required this visit?  No     Plan:     I have personally reviewed and noted the following in the patient's chart:   Medical and social  history Use of alcohol, tobacco or illicit drugs  Current medications and supplements including opioid prescriptions. Patient is not currently taking opioid prescriptions. Functional ability and status Nutritional status Physical activity Advanced directives List of other physicians Hospitalizations, surgeries, and ER visits in previous 12 months Vitals Screenings to include cognitive, depression, and falls Referrals and appointments  In addition, I have reviewed and discussed with patient certain preventive protocols, quality metrics, and best practice recommendations. A written personalized care plan for preventive services as well as general preventive health recommendations were provided to patient.   Any medications not marked as taking were not mentioned by the patient (or their caregiver if applicable) when reconciling the medications.  Because this visit was a virtual/telehealth visit,  certain criteria was not obtained, such a blood pressure, CBG if patient is a diabetic, and timed up and go.    Jordan Hawks Venba Zenner, CMA   05/25/2023   After Visit Summary: (MyChart) Due to this being a telephonic visit, the after visit summary with patients personalized plan was offered to patient via MyChart   Nurse Notes: Mobile Mammogram ordered today

## 2023-05-25 NOTE — Patient Instructions (Addendum)
Carrie Perkins , Thank you for taking time to come for your Medicare Wellness Visit. I appreciate your ongoing commitment to your health goals. Please review the following plan we discussed and let me know if I can assist you in the future.   These are the goals we discussed:  Goals      Patient Stated     05/16/2022, wants to lose weight and work on balance     Patient Stated     Travel more and remain active and health and be able to continue to work        This is a list of the screening recommended for you and due dates:  Health Maintenance  Topic Date Due   Eye exam for diabetics  Never done   Zoster (Shingles) Vaccine (1 of 2) Never done   DTaP/Tdap/Td vaccine (2 - Td or Tdap) 11/14/2020   Flu Shot  06/15/2023   Yearly kidney health urinalysis for diabetes  08/19/2023   Complete foot exam   08/19/2023   Hemoglobin A1C  09/06/2023   Yearly kidney function blood test for diabetes  03/06/2024   Mammogram  05/09/2024   Medicare Annual Wellness Visit  05/24/2024   Colon Cancer Screening  12/08/2024   Pneumonia Vaccine  Completed   DEXA scan (bone density measurement)  Completed   HPV Vaccine  Aged Out   COVID-19 Vaccine  Discontinued   Hepatitis C Screening  Discontinued    Advanced directives: Advance directive discussed with you today. Even though you declined this today, please call our office should you change your mind, and we can give you the proper paperwork for you to fill out. Advance care planning is a way to make decisions about medical care that fits your values in case you are ever unable to make these decisions for yourself.  Information on Advanced Care Planning can be found at [facility] Advance Health Care Directives Advance Health Care Directives ([email or web address]    Conditions/risks identified:  You have an order for:  []   2D Mammogram  [x]   3D Mammogram  []   Bone Density     Please call for appointment:   [facility] [phone]  Make sure to wear two-piece clothing.  No lotions powders or deodorants the day of the appointment Make sure to bring picture ID and insurance card.  Bring list of medications you are currently taking including any supplements.   Schedule your [place] screening mammogram through MyChart!   Log into your MyChart account.  Go to 'Visit' (or 'Appointments' if on mobile App) --> Schedule an Appointment  Under 'Select a Reason for Visit' choose the Mammogram Screening option.  Complete the pre-visit questions and select the time and place that best fits your schedule.    Next appointment: VIRTUAL/TELEPHONE APPOINTMENT Follow up in one year for your annual wellness visit  May 28, 2024 at 8:15 am telephone visit    Preventive Care 50 Years and Older, Female Preventive care refers to lifestyle choices and visits with your health care provider that can promote health and wellness. What does preventive care include? A yearly physical exam. This is also called an annual well check. Dental exams once or twice a year. Routine eye exams. Ask your health care provider how often you should have your eyes checked. Personal lifestyle choices, including: Daily care of your teeth and gums. Regular physical activity. Eating a healthy diet. Avoiding tobacco and drug use.  Limiting alcohol use. Practicing safe sex. Taking low-dose aspirin every day. Taking vitamin and mineral supplements as recommended by your health care provider. What happens during an annual well check? The services and screenings done by your health care provider during your annual well check will depend on your age, overall health, lifestyle risk factors, and family history of disease. Counseling  Your health care provider may ask you questions about your: Alcohol use. Tobacco use. Drug use. Emotional well-being. Home and relationship well-being. Sexual activity. Eating habits. History  of falls. Memory and ability to understand (cognition). Work and work Astronomer. Reproductive health. Screening  You may have the following tests or measurements: Height, weight, and BMI. Blood pressure. Lipid and cholesterol levels. These may be checked every 5 years, or more frequently if you are over 39 years old. Skin check. Lung cancer screening. You may have this screening every year starting at age 67 if you have a 30-pack-year history of smoking and currently smoke or have quit within the past 15 years. Fecal occult blood test (FOBT) of the stool. You may have this test every year starting at age 68. Flexible sigmoidoscopy or colonoscopy. You may have a sigmoidoscopy every 5 years or a colonoscopy every 10 years starting at age 79. Hepatitis C blood test. Hepatitis B blood test. Sexually transmitted disease (STD) testing. Diabetes screening. This is done by checking your blood sugar (glucose) after you have not eaten for a while (fasting). You may have this done every 1-3 years. Bone density scan. This is done to screen for osteoporosis. You may have this done starting at age 31. Mammogram. This may be done every 1-2 years. Talk to your health care provider about how often you should have regular mammograms. Talk with your health care provider about your test results, treatment options, and if necessary, the need for more tests. Vaccines  Your health care provider may recommend certain vaccines, such as: Influenza vaccine. This is recommended every year. Tetanus, diphtheria, and acellular pertussis (Tdap, Td) vaccine. You may need a Td booster every 10 years. Zoster vaccine. You may need this after age 54. Pneumococcal 13-valent conjugate (PCV13) vaccine. One dose is recommended after age 22. Pneumococcal polysaccharide (PPSV23) vaccine. One dose is recommended after age 60. Talk to your health care provider about which screenings and vaccines you need and how often you need  them. This information is not intended to replace advice given to you by your health care provider. Make sure you discuss any questions you have with your health care provider. Document Released: 11/27/2015 Document Revised: 07/20/2016 Document Reviewed: 09/01/2015 Elsevier Interactive Patient Education  2017 [employer].  Fall Prevention in the Home Falls can cause injuries. They can happen to people of all ages. There are many things you can do to make your home safe and to help prevent falls. What can I do on the outside of my home? Regularly fix the edges of walkways and driveways and fix any cracks. Remove anything that might make you trip as you walk through a door, such as a raised step or threshold. Trim any bushes or trees on the path to your home. Use bright outdoor lighting. Clear any walking paths of anything that might make someone trip, such as rocks or tools. Regularly check to see if handrails are loose or broken. Make sure that both sides of any steps have handrails. Any raised decks and porches should have guardrails on the edges. Have any leaves, snow, or ice cleared regularly.  Use sand or salt on walking paths during winter. Clean up any spills in your garage right away. This includes oil or grease spills. What can I do in the bathroom? Use night lights. Install grab bars by the toilet and in the tub and shower. Do not use towel bars as grab bars. Use non-skid mats or decals in the tub or shower. If you need to sit down in the shower, use a plastic, non-slip stool. Keep the floor dry. Clean up any water that spills on the floor as soon as it happens. Remove soap buildup in the tub or shower regularly. Attach bath mats securely with double-sided non-slip rug tape. Do not have throw rugs and other things on the floor that can make you trip. What can I do in the bedroom? Use night lights. Make sure that you have a light by your bed that is easy to reach. Do not use  any sheets or blankets that are too big for your bed. They should not hang down onto the floor. Have a firm chair that has side arms. You can use this for support while you get dressed. Do not have throw rugs and other things on the floor that can make you trip. What can I do in the kitchen? Clean up any spills right away. Avoid walking on wet floors. Keep items that you use a lot in easy-to-reach places. If you need to reach something above you, use a strong step stool that has a grab bar. Keep electrical cords out of the way. Do not use floor polish or wax that makes floors slippery. If you must use wax, use non-skid floor wax. Do not have throw rugs and other things on the floor that can make you trip. What can I do with my stairs? Do not leave any items on the stairs. Make sure that there are handrails on both sides of the stairs and use them. Fix handrails that are broken or loose. Make sure that handrails are as long as the stairways. Check any carpeting to make sure that it is firmly attached to the stairs. Fix any carpet that is loose or worn. Avoid having throw rugs at the top or bottom of the stairs. If you do have throw rugs, attach them to the floor with carpet tape. Make sure that you have a light switch at the top of the stairs and the bottom of the stairs. If you do not have them, ask someone to add them for you. What else can I do to help prevent falls? Wear shoes that: Do not have high heels. Have rubber bottoms. Are comfortable and fit you well. Are closed at the toe. Do not wear sandals. If you use a stepladder: Make sure that it is fully opened. Do not climb a closed stepladder. Make sure that both sides of the stepladder are locked into place. Ask someone to hold it for you, if possible. Clearly mark and make sure that you can see: Any grab bars or handrails. First and last steps. Where the edge of each step is. Use tools that help you move around (mobility aids)  if they are needed. These include: Canes. Walkers. Scooters. Crutches. Turn on the lights when you go into a dark area. Replace any light bulbs as soon as they burn out. Set up your furniture so you have a clear path. Avoid moving your furniture around. If any of your floors are uneven, fix them. If there are any pets around you,  be aware of where they are. Review your medicines with your doctor. Some medicines can make you feel dizzy. This can increase your chance of falling. Ask your doctor what other things that you can do to help prevent falls. This information is not intended to replace advice given to you by your health care provider. Make sure you discuss any questions you have with your health care provider. Document Released: 08/27/2009 Document Revised: 04/07/2016 Document Reviewed: 12/05/2014 Elsevier Interactive Patient Education  2017 [employer].

## 2023-06-16 ENCOUNTER — Ambulatory Visit
Admission: RE | Admit: 2023-06-16 | Discharge: 2023-06-16 | Disposition: A | Discharge status: Home / Self Care | Payer: Medicare HMO | Source: Ambulatory Visit | Attending: Family Medicine | Admitting: Family Medicine

## 2023-06-16 DIAGNOSIS — Z1231 Encounter for screening mammogram for malignant neoplasm of breast: Secondary | ICD-10-CM | POA: Diagnosis not present

## 2023-06-29 IMAGING — DX DG CHEST 2V
2 series · 2 of 2 positions shown · non-contrast
Comparison: 01/13/2020

CLINICAL DATA: Chest congestion

EXAM:
CHEST - 2 VIEW

[chest pa]
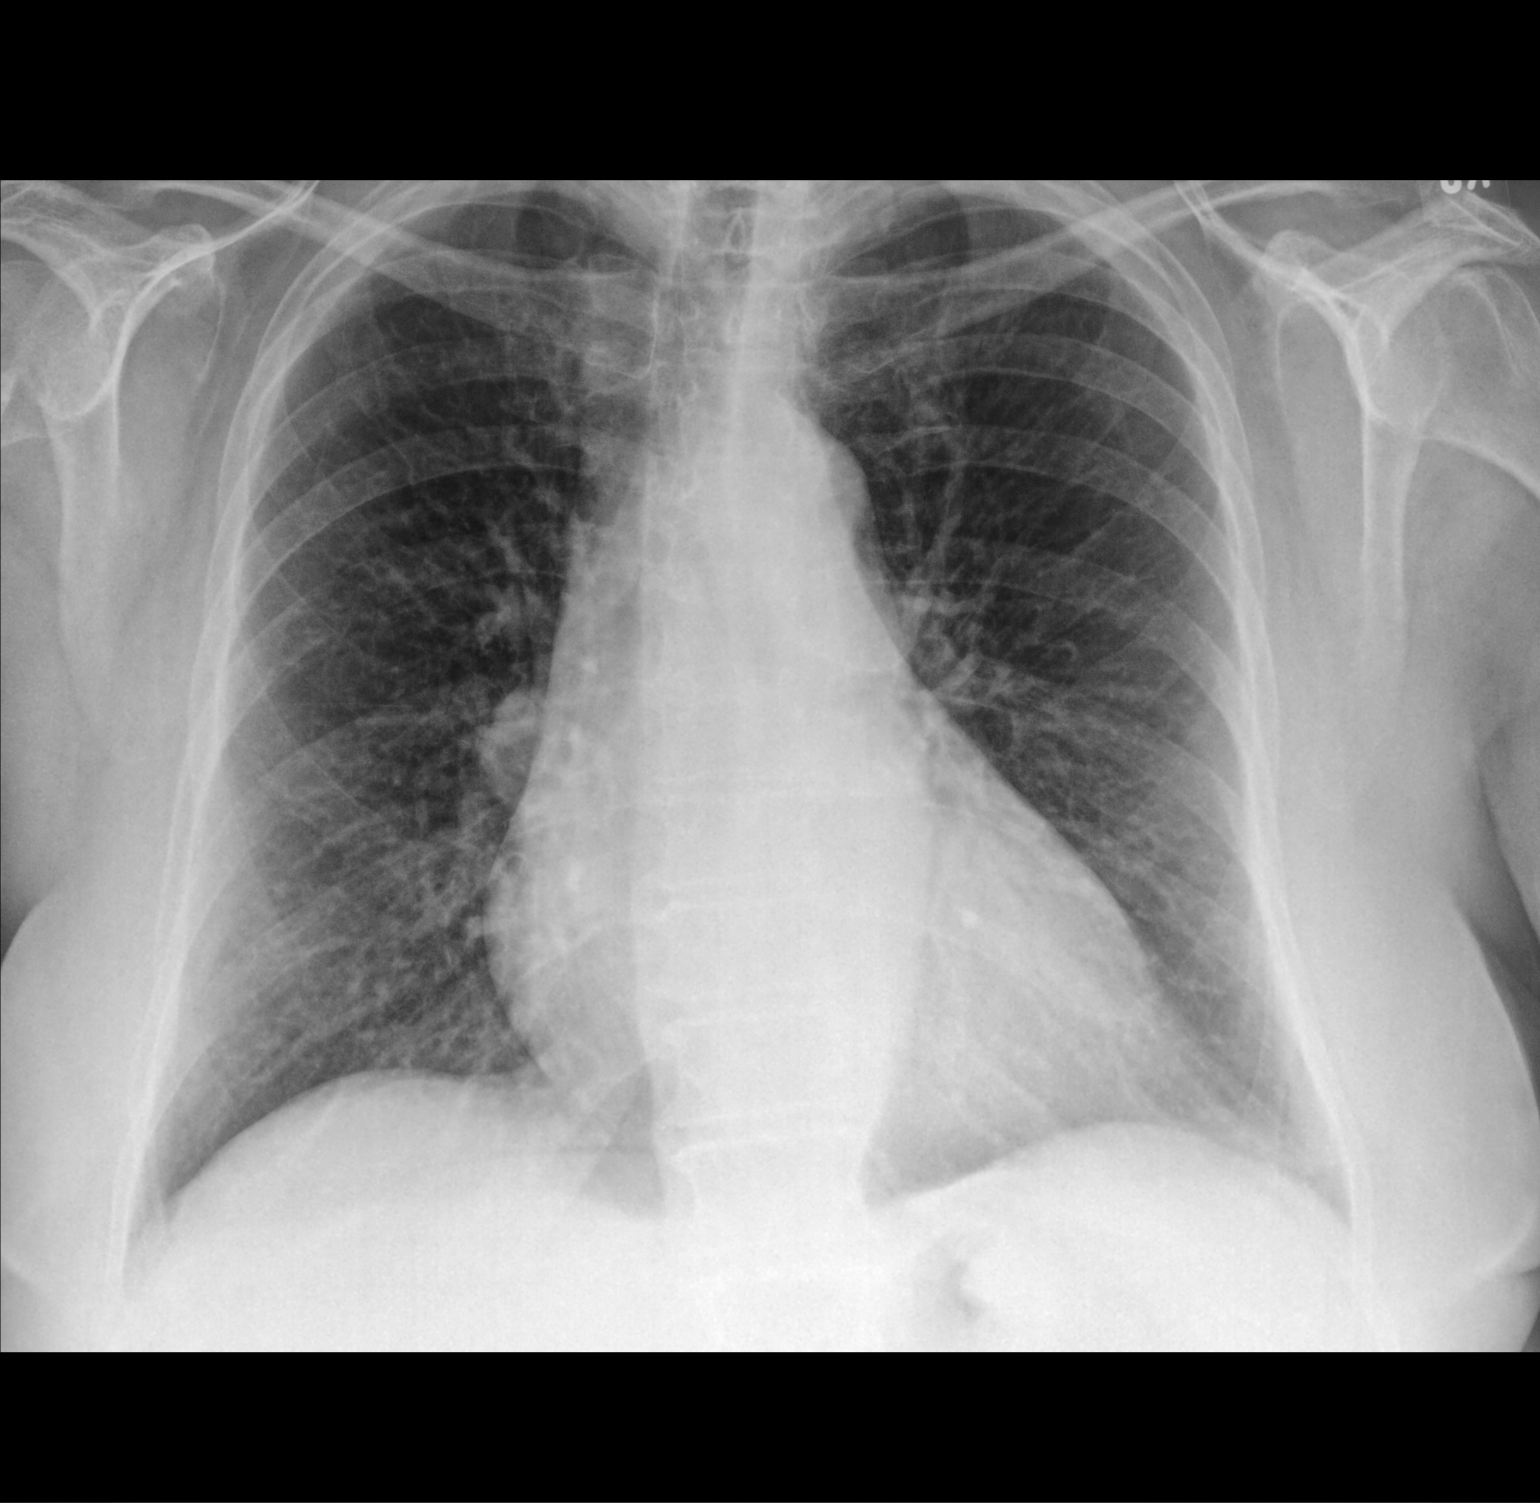

[chest lat]
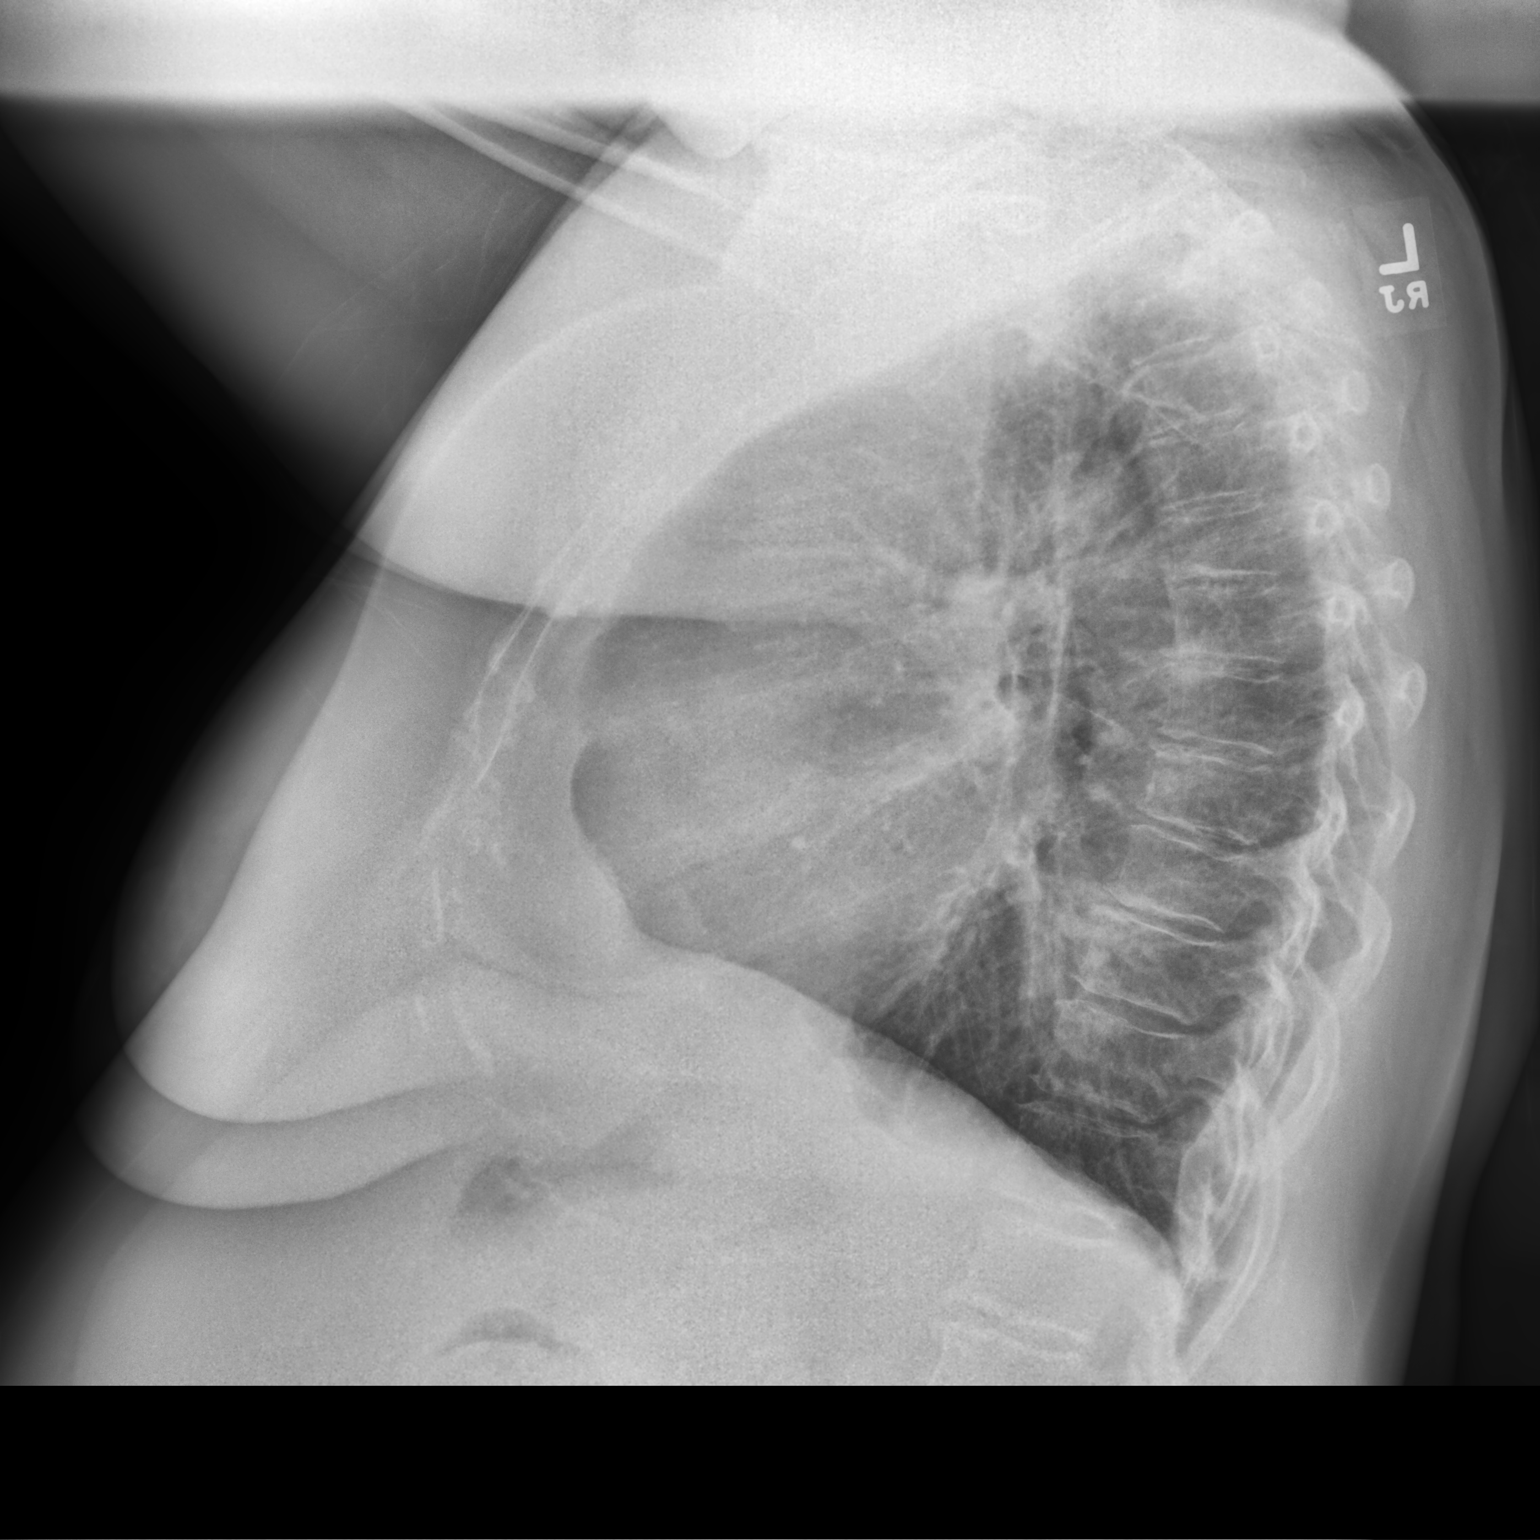

[2 of 2 positions shown; findings below may reference images not displayed]

FINDINGS: Enlargement of cardiac silhouette.

Mediastinal contours and pulmonary vascularity normal.

Slight chronic accentuation of interstitial markings stable.

No acute infiltrate, pleural effusion, or pneumothorax.

Osseous structures unremarkable.
IMPRESSION: Enlargement of cardiac silhouette.

No acute abnormalities.

## 2023-07-24 ENCOUNTER — Ambulatory Visit (INDEPENDENT_AMBULATORY_CARE_PROVIDER_SITE_OTHER): Payer: Medicare HMO

## 2023-07-24 ENCOUNTER — Encounter: Payer: Self-pay | Admitting: Adult Health

## 2023-07-24 ENCOUNTER — Ambulatory Visit: Payer: Medicare HMO | Admitting: Adult Health

## 2023-07-24 VITALS — BP 126/70 | HR 77 | Temp 98.2°F | Ht 65.5 in | Wt 206.2 lb

## 2023-07-24 DIAGNOSIS — J302 Other seasonal allergic rhinitis: Secondary | ICD-10-CM | POA: Diagnosis not present

## 2023-07-24 DIAGNOSIS — Z23 Encounter for immunization: Secondary | ICD-10-CM

## 2023-07-24 DIAGNOSIS — J45909 Unspecified asthma, uncomplicated: Secondary | ICD-10-CM | POA: Diagnosis not present

## 2023-07-24 DIAGNOSIS — J4530 Mild persistent asthma, uncomplicated: Secondary | ICD-10-CM

## 2023-07-24 DIAGNOSIS — J4541 Moderate persistent asthma with (acute) exacerbation: Secondary | ICD-10-CM

## 2023-07-24 NOTE — Assessment & Plan Note (Signed)
Mild persistent Asthma - doing well on current regimen   Plan  Patient Instructions  Continue on Symbiocrt 2 puffs Twice daily, rinse after use.  Zyrtec 10mg  At bedtime As needed   Astelin and QNASAL nasal spray As needed   ProAir As needed  Wheezing .  Activity as tolerated.  Chest xray today.  Flu shot today.  Follow up in office in 6 months Dr. Tonia Brooms or Hunner Garcon NP and As needed   Please contact office for sooner follow up if symptoms do not improve or worsen or seek emergency care

## 2023-07-24 NOTE — Progress Notes (Signed)
@Patient  ID: Carrie Perkins, female    DOB: 1951-10-29, 72 y.o.   MRN: [id number]  Chief Complaint  Patient presents with   Follow-up    Referring provider: Excell Seltzer, MD  HPI: 72 yo female never smoker followed for asthma and allergic rhinitis  Medical history significant for diastolic dysfunction and OSA (Declines CPAP)  Travels extensively   TEST/EVENTS :  OSA> she had sleep study 2008 w/ AHI=22 & desat tp 80%; she declined CPAP    2D echo 2015 EF 60-65%, 2 diastolic dysfunction   Chest x-ray January 13, 2020 showed clear lungs.   PFTs August 28, 2020 showed FEV1 at 83%, ratio 87, FVC 72%, no significant bronchodilator response, DLCO 92%.  07/24/2023 Follow up: Asthma and Allergic Rhinitis  Patient returns for a follow-up visit.  Last seen November 2023.  Patient says overall her asthma has been doing okay.  She did have COVID-19 infection last month with mild URI symptoms.  Was called in Paxlovid, tolerated well. Minimal symptoms , resolved with in 1 week.  She remains active.  Goes to water aerobics 3 days a week.  Is trying to work on weight loss.  She is going to a weight loss clinic later this week.  She says has been a stressful year as her brother-in-law passed away from a critical illness.  She says overall she has been feeling well.  She denies any cough or wheezing.  No increased albuterol use.  She remains on Symbicort twice daily.  Takes Zyrtec Astelin and Qnasl spray as needed.  Patient is requesting a chest x-ray as she has not had one in a while. Concerned that Covid and asthma could have scarred the lungs.     Allergies  Allergen Reactions   Amoxicillin-Pot Clavulanate     REACTION: severe abd pain---intolerant   Biaxin [Clarithromycin] Other (See Comments)    Caused severe stomach pain     Immunization History  Administered Date(s) Administered   Fluad Trivalent(High Dose 65+) 07/24/2023   Influenza Split 12/12/2011   Influenza Whole 08/14/2009    PFIZER Comirnaty(Gray Top)Covid-19 Tri-Sucrose Vaccine 08/28/2020   PFIZER(Purple Top)SARS-COV-2 Vaccination 12/20/2019, 01/14/2020   PNEUMOCOCCAL CONJUGATE-20 07/06/2021   Pneumococcal Polysaccharide-23 08/02/2022   Tdap 11/14/2010    Past Medical History:  Diagnosis Date   Allergic rhinitis, cause unspecified    Obstructive sleep apnea (adult) (pediatric)    Osteoarthrosis, unspecified whether generalized or localized, unspecified site    Other and unspecified hyperlipidemia    Other dyspnea and respiratory abnormality    Overweight(278.02)    Unspecified asthma(493.90)    Unspecified urinary incontinence     Tobacco History: Social History   Tobacco Use  Smoking Status Never   Passive exposure: Past  Smokeless Tobacco Never   Counseling given: Not Answered   Outpatient Medications Prior to Visit  Medication Sig Dispense Refill   albuterol (VENTOLIN HFA) 108 (90 Base) MCG/ACT inhaler INHALE 1-2 PUFFS BY MOUTH EVERY 6 HOURS AS NEEDED FOR WHEEZE OR SHORTNESS OF BREATH 18 each 2   aspirin EC 81 MG tablet Take 81 mg by mouth daily.     azelastine (ASTELIN) 0.1 % nasal spray Place 1 spray into both nostrils 2 (two) times daily as needed for rhinitis. Use in each nostril as directed     Beclomethasone Dipropionate (QNASL) 80 MCG/ACT AERS Place 2 each into the nose at bedtime as needed.     blood glucose meter kit and supplies Dispense based on patient and  insurance preference. Use up to four times daily as directed. (FOR ICD-10 E11.9). 1 each 0   budesonide-formoterol (SYMBICORT) 160-4.5 MCG/ACT inhaler Inhale 2 puffs into the lungs 2 (two) times daily as needed.     cetirizine (ZYRTEC) 10 MG chewable tablet Chew 10 mg by mouth 2 (two) times daily.     Cholecalciferol 1000 UNITS capsule Take 1,000 Units by mouth daily.     dextromethorphan-guaiFENesin (MUCINEX DM) 30-600 MG per 12 hr tablet Take 2 tablets by mouth every 12 (twelve) hours.     Glucose Blood (BLOOD GLUCOSE TEST  STRIPS) STRP Test blood sugar as directed. (FOR ICD-10 E11.9). 100 strip 3   Microlet Lancets MISC Use to test blood sugar as directed. (FOR ICD-10 E11.9). 100 each 3   NON FORMULARY doterra essential oil Mixes in water     Red Yeast Rice 600 MG CAPS Take 1 capsule by mouth daily.     Semaglutide, 2 MG/DOSE, 8 MG/3ML SOPN Inject 2 mg as directed once a week. 9 mL 3   Turmeric (QC TUMERIC COMPLEX PO) Take 1 tablet by mouth daily.     Zinc Sulfate (ZINC 15 PO) Take 1 tablet by mouth daily. Not sure of the dose     benzonatate (TESSALON) 200 MG capsule Take 1 capsule (200 mg total) by mouth 3 (three) times daily as needed for cough. (Patient not taking: Reported on 07/24/2023) 45 capsule 3   predniSONE (DELTASONE) 10 MG tablet 3 tabs by mouth daily x 3 days, then 2 tabs by mouth daily x 2 days then 1 tab by mouth daily x 2 days (Patient not taking: Reported on 07/24/2023) 15 tablet 0   No facility-administered medications prior to visit.     Review of Systems:   Constitutional:   No  weight loss, night sweats,  Fevers, chills, fatigue, or  lassitude.  HEENT:   No headaches,  Difficulty swallowing,  Tooth/dental problems, or  Sore throat,                No sneezing, itching, ear ache, nasal congestion, post nasal drip,   CV:  No chest pain,  Orthopnea, PND, swelling in lower extremities, anasarca, dizziness, palpitations, syncope.   GI  No heartburn, indigestion, abdominal pain, nausea, vomiting, diarrhea, change in bowel habits, loss of appetite, bloody stools.   Resp: No shortness of breath with exertion or at rest.  No excess mucus, no productive cough,  No non-productive cough,  No coughing up of blood.  No change in color of mucus.  No wheezing.  No chest wall deformity  Skin: no rash or lesions.  GU: no dysuria, change in color of urine, no urgency or frequency.  No flank pain, no hematuria   MS:  No joint pain or swelling.  No decreased range of motion.  No back pain.    Physical  Exam  BP 126/70 (BP Location: Left Arm, Patient Position: Sitting, Cuff Size: Normal)   Pulse 77   Temp 98.2 F (36.8 C) (Oral)   Ht 5' 5.5" (1.664 m)   Wt 206 lb 3.2 oz (93.5 kg)   SpO2 96%   BMI 33.79 kg/m   GEN: A/Ox3; pleasant , NAD, well nourished    HEENT:  [place]/AT,  NOSE-clear, THROAT-clear, no lesions, no postnasal drip or exudate noted.   NECK:  Supple w/ fair ROM; no JVD; normal carotid impulses w/o bruits; no thyromegaly or nodules palpated; no lymphadenopathy.    RESP  Clear  P &  A; w/o, wheezes/ rales/ or rhonchi. no accessory muscle use, no dullness to percussion  CARD:  RRR, no m/r/g, no peripheral edema, pulses intact, no cyanosis or clubbing.  GI:   Soft & nt; nml bowel sounds; no organomegaly or masses detected.   Musco: Warm bil, no deformities or joint swelling noted.   Neuro: alert, no focal deficits noted.    Skin: Warm, no lesions or rashes    Lab Results:      BNP No results found for: "BNP"  ProBNP No results found for: "PROBNP"  Imaging: No results found.  Administration History     None          Latest Ref Rng & Units 08/28/2020    9:00 AM  PFT Results  FVC-Pre L 2.24   FVC-Predicted Pre % 72   FVC-Post L 2.24   FVC-Predicted Post % 72   Pre FEV1/FVC % % 84   Post FEV1/FCV % % 87   FEV1-Pre L 1.89   FEV1-Predicted Pre % 80   FEV1-Post L 1.95   DLCO uncorrected ml/min/mmHg 18.53   DLCO UNC% % 92   DLCO corrected ml/min/mmHg 18.53   DLCO COR %Predicted % 92   DLVA Predicted % 120   TLC L 3.91   TLC % Predicted % 75   RV % Predicted % 71     No results found for: "NITRICOXIDE"      Assessment & Plan:   Moderate persistent asthma with exacerbation Mild persistent Asthma - doing well on current regimen   Plan  Patient Instructions  Continue on Symbiocrt 2 puffs Twice daily, rinse after use.  Zyrtec 10mg  At bedtime As needed   Astelin and QNASAL nasal spray As needed   ProAir As needed  Wheezing .   Activity as tolerated.  Chest xray today.  Flu shot today.  Follow up in office in 6 months Dr. Tonia Brooms or Lamon Rotundo NP and As needed   Please contact office for sooner follow up if symptoms do not improve or worsen or seek emergency care      Allergic rhinitis Continue on current regimen      Rubye Oaks, NP 07/24/2023

## 2023-07-24 NOTE — Patient Instructions (Addendum)
Continue on Symbiocrt 2 puffs Twice daily, rinse after use.  Zyrtec 10mg  At bedtime As needed   Astelin and QNASAL nasal spray As needed   ProAir As needed  Wheezing .  Activity as tolerated.  Chest xray today.  Flu shot today.  Follow up in office in 6 months Dr. Tonia Brooms or Doshie Maggi NP and As needed   Please contact office for sooner follow up if symptoms do not improve or worsen or seek emergency care

## 2023-07-24 NOTE — Assessment & Plan Note (Signed)
Continue on current regimen .   

## 2023-08-11 ENCOUNTER — Ambulatory Visit (INDEPENDENT_AMBULATORY_CARE_PROVIDER_SITE_OTHER): Payer: Medicare HMO | Admitting: Family Medicine

## 2023-08-11 VITALS — BP 132/70 | HR 85 | Temp 98.0°F | Ht 65.0 in | Wt 206.4 lb

## 2023-08-11 DIAGNOSIS — J4541 Moderate persistent asthma with (acute) exacerbation: Secondary | ICD-10-CM

## 2023-08-11 DIAGNOSIS — E1169 Type 2 diabetes mellitus with other specified complication: Secondary | ICD-10-CM | POA: Diagnosis not present

## 2023-08-11 DIAGNOSIS — J029 Acute pharyngitis, unspecified: Secondary | ICD-10-CM | POA: Diagnosis not present

## 2023-08-11 LAB — POCT GLYCOSYLATED HEMOGLOBIN (HGB A1C): Hemoglobin A1C: 7.5 % — AB (ref 4.0–5.6)

## 2023-08-11 LAB — POCT RAPID STREP A (OFFICE): Rapid Strep A Screen: NEGATIVE

## 2023-08-11 LAB — POC COVID19 BINAXNOW: SARS Coronavirus 2 Ag: NEGATIVE

## 2023-08-11 MED ORDER — AZITHROMYCIN 250 MG PO TABS: ORAL_TABLET | ORAL | 0 refills | Status: DC

## 2023-08-11 MED ORDER — PREDNISONE 20 MG PO TABS: ORAL_TABLET | ORAL | 0 refills | Status: DC

## 2023-08-11 MED ORDER — BENZONATATE 200 MG PO CAPS: 200.0000 mg | ORAL_CAPSULE | Freq: Two times a day (BID) | ORAL | 0 refills | Status: DC | PRN

## 2023-08-11 NOTE — Addendum Note (Signed)
Addended byVickii Chafe on: 08/11/2023 11:23 AM   Modules accepted: Orders

## 2023-08-11 NOTE — Progress Notes (Signed)
Patient ID: Carrie Perkins, female    DOB: 09/22/1951, 72 y.o.   MRN: [id number]  This visit was conducted in person.  BP 132/70 (BP Location: Right Arm, Patient Position: Sitting, Cuff Size: Large)   Pulse 85   Temp 98 F (36.7 C) (Oral)   Ht 5\' 5"  (1.651 m)   Wt 206 lb 6.4 oz (93.6 kg)   SpO2 97%   BMI 34.35 kg/m    CC:  Chief Complaint  Patient presents with   Sore Throat    Pt c/o sore throat, congestion on chest, coughing. Started yesterday. Pt states she is leaving town.     Subjective:   HPI: STASSI FADELY is a 72 y.o. female patient with history of HFpEF, moderate persistent asthma and diabetes presenting on 08/11/2023 for Sore Throat (Pt c/o sore throat, congestion on chest, coughing. Started yesterday. Pt states she is leaving town. )    Date of onset:   yesterday Initial symptoms included  ST Symptoms progressed to progressed to chest congestion, cough dry. No nasal congestion.  NO fever  NO chills.  No body aches. No ear pain, no face pain. Using albuterol more often in last 24 hours  given chest tightness.  Some SOB.    Sick contacts:  church lady with COVID.Marland KitchenMarland Kitchen Had COIVD 1 months ago. COVID testing:   none     She has tried to treat with  albuterol. Mucinex,     She has history of moderate persistent asthma, maintained on Symbicort,  no recent Qnasl or Astelin and albuterol as needed. Non-smoker.     DM: Was on Ozempic... but cost too much.  Seeing weight loss MD... they prescribe Mounjaro which she has not tried yet. Lab Results  Component Value Date   HGBA1C 7.4 (H) 03/07/2023     Relevant past medical, surgical, family and social history reviewed and updated as indicated. Interim medical history since our last visit reviewed. Allergies and medications reviewed and updated. Outpatient Medications Prior to Visit  Medication Sig Dispense Refill   albuterol (VENTOLIN HFA) 108 (90 Base) MCG/ACT inhaler INHALE 1-2 PUFFS BY MOUTH EVERY 6 HOURS AS  NEEDED FOR WHEEZE OR SHORTNESS OF BREATH 18 each 2   aspirin EC 81 MG tablet Take 81 mg by mouth daily.     azelastine (ASTELIN) 0.1 % nasal spray Place 1 spray into both nostrils 2 (two) times daily as needed for rhinitis. Use in each nostril as directed     blood glucose meter kit and supplies Dispense based on patient and insurance preference. Use up to four times daily as directed. (FOR ICD-10 E11.9). 1 each 0   budesonide-formoterol (SYMBICORT) 160-4.5 MCG/ACT inhaler Inhale 2 puffs into the lungs 2 (two) times daily as needed.     cetirizine (ZYRTEC) 10 MG chewable tablet Chew 10 mg by mouth 2 (two) times daily.     Cholecalciferol 1000 UNITS capsule Take 1,000 Units by mouth daily.     dextromethorphan-guaiFENesin (MUCINEX DM) 30-600 MG per 12 hr tablet Take 2 tablets by mouth every 12 (twelve) hours.     Glucose Blood (BLOOD GLUCOSE TEST STRIPS) STRP Test blood sugar as directed. (FOR ICD-10 E11.9). 100 strip 3   Microlet Lancets MISC Use to test blood sugar as directed. (FOR ICD-10 E11.9). 100 each 3   NON FORMULARY doterra essential oil Mixes in water     Red Yeast Rice 600 MG CAPS Take by mouth.     Turmeric (QC  TUMERIC COMPLEX PO) Take 1 tablet by mouth daily.     Zinc Sulfate (ZINC 15 PO) Take 1 tablet by mouth daily. Not sure of the dose     Beclomethasone Dipropionate (QNASL) 80 MCG/ACT AERS Place 2 each into the nose at bedtime as needed. (Patient not taking: Reported on 08/11/2023)     Semaglutide, 2 MG/DOSE, 8 MG/3ML SOPN Inject 2 mg as directed once a week. (Patient not taking: Reported on 08/11/2023) 9 mL 3   Red Yeast Rice 600 MG CAPS Take 1 capsule by mouth daily. (Patient not taking: Reported on 08/11/2023)     No facility-administered medications prior to visit.     Per HPI unless specifically indicated in ROS section below Review of Systems  Constitutional:  Negative for fatigue and fever.  HENT:  Negative for congestion.   Eyes:  Negative for pain.  Respiratory:   Positive for cough, chest tightness, shortness of breath and wheezing.   Cardiovascular:  Negative for chest pain, palpitations and leg swelling.  Gastrointestinal:  Negative for abdominal pain.  Genitourinary:  Negative for dysuria and vaginal bleeding.  Musculoskeletal:  Negative for back pain.  Neurological:  Negative for syncope, light-headedness and headaches.  Psychiatric/Behavioral:  Negative for dysphoric mood.    Objective:  BP 132/70 (BP Location: Right Arm, Patient Position: Sitting, Cuff Size: Large)   Pulse 85   Temp 98 F (36.7 C) (Oral)   Ht 5\' 5"  (1.651 m)   Wt 206 lb 6.4 oz (93.6 kg)   SpO2 97%   BMI 34.35 kg/m   Wt Readings from Last 3 Encounters:  08/11/23 206 lb 6.4 oz (93.6 kg)  07/24/23 206 lb 3.2 oz (93.5 kg)  05/25/23 201 lb (91.2 kg)      Physical Exam Constitutional:      General: She is not in acute distress.    Appearance: Normal appearance. She is well-developed. She is not ill-appearing or toxic-appearing.  HENT:     Head: Normocephalic.     Right Ear: Hearing, tympanic membrane, ear canal and external ear normal. Tympanic membrane is not erythematous, retracted or bulging.     Left Ear: Hearing, tympanic membrane, ear canal and external ear normal. Tympanic membrane is not erythematous, retracted or bulging.     Nose: No mucosal edema or rhinorrhea.     Right Sinus: No maxillary sinus tenderness or frontal sinus tenderness.     Left Sinus: No maxillary sinus tenderness or frontal sinus tenderness.     Mouth/Throat:     Mouth: Oropharynx is clear and moist and mucous membranes are normal.     Pharynx: Uvula midline.  Eyes:     General: Lids are normal. Lids are everted, no foreign bodies appreciated.     Extraocular Movements: EOM normal.     Conjunctiva/sclera: Conjunctivae normal.     Pupils: Pupils are equal, round, and reactive to light.  Neck:     Thyroid: No thyroid mass or thyromegaly.     Vascular: No carotid bruit.     Trachea:  Trachea normal.  Cardiovascular:     Rate and Rhythm: Normal rate and regular rhythm.     Pulses: Normal pulses.     Heart sounds: Normal heart sounds, S1 normal and S2 normal. No murmur heard.    No friction rub. No gallop.  Pulmonary:     Effort: Pulmonary effort is normal. No tachypnea or respiratory distress.     Breath sounds: Decreased breath sounds and wheezing present. No  rhonchi or rales.  Abdominal:     General: Bowel sounds are normal.     Palpations: Abdomen is soft.     Tenderness: There is no abdominal tenderness.  Musculoskeletal:     Cervical back: Normal range of motion and neck supple.  Skin:    General: Skin is warm, dry and intact.     Findings: No rash.  Neurological:     Mental Status: She is alert.  Psychiatric:        Mood and Affect: Mood is not anxious or depressed.        Speech: Speech normal.        Behavior: Behavior normal. Behavior is cooperative.        Thought Content: Thought content normal.        Cognition and Memory: Cognition and memory normal.        Judgment: Judgment normal.       Results for orders placed or performed in visit on 08/11/23  POC COVID-19  Result Value Ref Range   SARS Coronavirus 2 Ag Negative Negative  POC Rapid Strep A  Result Value Ref Range   Rapid Strep A Screen Negative Negative    Assessment and Plan  Moderate persistent asthma with exacerbation Assessment & Plan: Acute, most likely viral etiology of flare.  Negative COVID and strep test.  no clear sign of bacterial infection at this point. Will treat with prednisone taper, continuing Symbicort and albuterol as needed. If she is not improving as expected or develops fever and productive cough she will consider using azithromycin to treat possible bacterial superinfection.   Return and ER precautions provided.   Type 2 diabetes mellitus with other specified complication, without long-term current use of insulin (HCC) Assessment & Plan:  Chronic..  inadequate control.   Agree with starting Mounjaro.  Re-eval in 3 months with A1C.   Sore throat -     POC COVID-19 BinaxNow -     POCT rapid strep A  Other orders -     predniSONE; 3 tabs by mouth daily x 3 days, then 2 tabs by mouth daily x 2 days then 1 tab by mouth daily x 2 days  Dispense: 15 tablet; Refill: 0 -     Azithromycin; 2 tab po x 1 day then 1 tab po daily  Dispense: 6 tablet; Refill: 0 -     Benzonatate; Take 1 capsule (200 mg total) by mouth 2 (two) times daily as needed for cough.  Dispense: 20 capsule; Refill: 0    No follow-ups on file.   [name], MD

## 2023-08-11 NOTE — Assessment & Plan Note (Addendum)
Chronic.. inadequate control.   Agree with starting Mounjaro.  Re-eval in 3 months with A1C.

## 2023-08-11 NOTE — Assessment & Plan Note (Addendum)
Acute, most likely viral etiology of flare.  Negative COVID and strep test.  no clear sign of bacterial infection at this point. Will treat with prednisone taper, continuing Symbicort and albuterol as needed. If she is not improving as expected or develops fever and productive cough she will consider using azithromycin to treat possible bacterial superinfection.   Return and ER precautions provided.

## 2023-10-10 ENCOUNTER — Other Ambulatory Visit: Payer: Self-pay | Admitting: Adult Health

## 2023-10-18 ENCOUNTER — Encounter: Payer: Self-pay | Admitting: Family Medicine

## 2023-10-18 NOTE — Telephone Encounter (Signed)
 Care team updated and letter sent for eye exam notes.

## 2023-11-28 NOTE — Progress Notes (Signed)
 @Patient  ID: Carrie Perkins, female    DOB: 1951/07/01, 73 y.o.   MRN: [id number]  No chief complaint on file.   Referring provider: Judithann Novas, MD  HPI: 73 year old female, never smoked.  Past medical history significant for asthma and allergic rhinitis.    11/29/2023- Interim hx  Discussed the use of AI scribe software for clinical note transcription with the patient, who gave verbal consent to proceed.  History of Present Illness   The patient, with a history of mild intermittent asthma and allergic rhinitis, presents with a week-long history of cough, yellow mucus production, and wheezing. The symptoms began with a sore throat, which the patient identifies as a precursor to chest involvement. The sore throat started a week ago, following a beach trip, and progressed to chest symptoms by the next day. The patient has been managing the symptoms with gargling and over-the-counter remedies for sore throat.  Despite regular use of Symbicort  (two puffs in the morning and evening), Zyrtec, and Astelin  nasal spray, the patient has experienced increased chest congestion and cough. The patient has not needed to use a rescue inhaler, as the chest tightness has not been severe. However, she has noticed wheezing, which she could feel by placing a hand on the chest. The patient also reports sinus pressure and pain, with nasal discharge. She has been using Astelin  nasal spray and saline rinses daily, which she finds helpful despite initial burning sensation.  The patient has also been taking Mucinex twice daily, including a DM formulation at night if coughing. Despite these measures, the patient's symptoms have not improved significantly. She also reports a history of symptoms recurring after antibiotic treatment. She has also been offered a prescription for prednisone , to be filled only if needed.      TEST/EVENTS :  OSA> she had sleep study 2008 w/ AHI=22 & desat tp 80%; she declined CPAP    2D  echo 2015 EF 60-65%, 2 diastolic dysfunction   Chest x-ray January 13, 2020 showed clear lungs.   PFTs August 28, 2020 showed FEV1 at 83%, ratio 87, FVC 72%, no significant bronchodilator response, DLCO 92%. Allergies  Allergen Reactions   Amoxicillin-Pot Clavulanate     REACTION: severe abd pain---intolerant   Biaxin [Clarithromycin] Other (See Comments)    Caused severe stomach pain     Immunization History  Administered Date(s) Administered   Fluad Trivalent(High Dose 65+) 07/24/2023   Influenza Split 12/12/2011   Influenza Whole 08/14/2009   PFIZER Comirnaty(Gray Top)Covid-19 Tri-Sucrose Vaccine 08/28/2020   PFIZER(Purple Top)SARS-COV-2 Vaccination 12/20/2019, 01/14/2020   PNEUMOCOCCAL CONJUGATE-20 07/06/2021   Pneumococcal Polysaccharide-23 08/02/2022   Tdap 11/14/2010    Past Medical History:  Diagnosis Date   Allergic rhinitis, cause unspecified    Obstructive sleep apnea (adult) (pediatric)    Osteoarthrosis, unspecified whether generalized or localized, unspecified site    Other and unspecified hyperlipidemia    Other dyspnea and respiratory abnormality    Overweight(278.02)    Unspecified asthma(493.90)    Unspecified urinary incontinence     Tobacco History: Social History   Tobacco Use  Smoking Status Never   Passive exposure: Past  Smokeless Tobacco Never   Counseling given: Not Answered   Outpatient Medications Prior to Visit  Medication Sig Dispense Refill   albuterol  (VENTOLIN  HFA) 108 (90 Base) MCG/ACT inhaler INHALE 1-2 PUFFS BY MOUTH EVERY 6 HOURS AS NEEDED FOR WHEEZE OR SHORTNESS OF BREATH 18 each 2   aspirin EC 81 MG tablet Take 81 mg  by mouth daily.     Azelastine  HCl 137 MCG/SPRAY SOLN PLACE 1 SPRAY INTO BOTH NOSTRILS 2 (TWO) TIMES DAILY. USE IN EACH NOSTRIL AS DIRECTED 30 mL 5   azithromycin  (ZITHROMAX ) 250 MG tablet 2 tab po x 1 day then 1 tab po daily 6 tablet 0   Beclomethasone Dipropionate  (QNASL ) 80 MCG/ACT AERS Place 2 each into the  nose at bedtime as needed. (Patient not taking: Reported on 08/11/2023)     benzonatate  (TESSALON ) 200 MG capsule Take 1 capsule (200 mg total) by mouth 2 (two) times daily as needed for cough. 20 capsule 0   blood glucose meter kit and supplies Dispense based on patient and insurance preference. Use up to four times daily as directed. (FOR ICD-10 E11.9). 1 each 0   budesonide -formoterol  (SYMBICORT ) 160-4.5 MCG/ACT inhaler Inhale 2 puffs into the lungs 2 (two) times daily as needed.     cetirizine (ZYRTEC) 10 MG chewable tablet Chew 10 mg by mouth 2 (two) times daily.     Cholecalciferol 1000 UNITS capsule Take 1,000 Units by mouth daily.     dextromethorphan-guaiFENesin (MUCINEX DM) 30-600 MG per 12 hr tablet Take 2 tablets by mouth every 12 (twelve) hours.     Glucose Blood (BLOOD GLUCOSE TEST STRIPS) STRP Test blood sugar as directed. (FOR ICD-10 E11.9). 100 strip 3   Microlet Lancets MISC Use to test blood sugar as directed. (FOR ICD-10 E11.9). 100 each 3   NON FORMULARY doterra essential oil Mixes in water     predniSONE  (DELTASONE ) 20 MG tablet 3 tabs by mouth daily x 3 days, then 2 tabs by mouth daily x 2 days then 1 tab by mouth daily x 2 days 15 tablet 0   Red Yeast Rice 600 MG CAPS Take by mouth.     Semaglutide , 2 MG/DOSE, 8 MG/3ML SOPN Inject 2 mg as directed once a week. (Patient not taking: Reported on 08/11/2023) 9 mL 3   Turmeric (QC TUMERIC COMPLEX PO) Take 1 tablet by mouth daily.     Zinc Sulfate (ZINC 15 PO) Take 1 tablet by mouth daily. Not sure of the dose     No facility-administered medications prior to visit.      Review of Systems  Review of Systems  Constitutional: Negative.   HENT:  Positive for congestion.   Respiratory:  Positive for cough and wheezing.      Physical Exam  There were no vitals taken for this visit. Physical Exam Constitutional:      Appearance: Normal appearance. She is not ill-appearing.  HENT:     Head: Normocephalic and atraumatic.      Mouth/Throat:     Mouth: Mucous membranes are moist.     Pharynx: Oropharynx is clear.  Cardiovascular:     Rate and Rhythm: Normal rate.  Pulmonary:     Effort: Pulmonary effort is normal.     Breath sounds: Normal breath sounds. No wheezing, rhonchi or rales.     Comments: Bronchitic cough; No rales or rhonchi to lung fields on exam  Musculoskeletal:        General: Normal range of motion.  Skin:    General: Skin is warm and dry.  Neurological:     General: No focal deficit present.     Mental Status: She is alert and oriented to person, place, and time. Mental status is at baseline.  Psychiatric:        Mood and Affect: Mood normal.  Behavior: Behavior normal.        Thought Content: Thought content normal.        Judgment: Judgment normal.      Lab Results:  CBC    Component Value Date/Time   WBC 5.7 08/02/2022 0842   RBC 4.69 08/02/2022 0842   HGB 14.2 08/02/2022 0842   HCT 41.1 08/02/2022 0842   PLT 272.0 08/02/2022 0842   MCV 87.7 08/02/2022 0842   MCHC 34.4 08/02/2022 0842   RDW 13.7 08/02/2022 0842   LYMPHSABS 2.6 01/20/2016 1000   MONOABS 0.5 01/20/2016 1000   EOSABS 0.2 01/20/2016 1000   BASOSABS 0.1 01/20/2016 1000    BMET    Component Value Date/Time   NA 137 03/07/2023 0859   K 3.9 03/07/2023 0859   CL 102 03/07/2023 0859   CO2 26 03/07/2023 0859   GLUCOSE 201 (H) 03/07/2023 0859   BUN 9 03/07/2023 0859   CREATININE 0.66 03/07/2023 0859   CALCIUM 9.0 03/07/2023 0859   GFRNONAA 102.19 05/11/2010 0915    BNP No results found for: "BNP"  ProBNP No results found for: "PROBNP"  Imaging: No results found.   Assessment & Plan:   1. Mild persistent asthma, unspecified whether complicated - Flutter valve; Future  2. Sinobronchitis (Primary)  Asthma and Allergic Rhinitis - Maintained on Symbicort  2 puffs BID, Zyrtec, and Astelin  nasal spray. No recent need for rescue inhaler. Continue current regimen.  Acute  Sinobronchitis New onset of cough, yellow sputum, wheezing, and sinus pressure for 1 week. No improvement with Mucinex and nasal sprays. -Start Doxycycline  for 7 days. -Continue Mucinex twice daily  -Prednisone  to have on hand if symptoms worsen, patient to notify provider via MyChart if filled and taken. -Use flutter valve 3 times a day to help clear chest congestion.      Antonio Baumgarten, NP 11/28/2023

## 2023-11-29 ENCOUNTER — Encounter: Payer: Self-pay | Admitting: Primary Care

## 2023-11-29 ENCOUNTER — Ambulatory Visit: Payer: Medicare Other | Admitting: Primary Care

## 2023-11-29 VITALS — BP 131/84 | HR 89 | Temp 97.7°F | Ht 65.0 in | Wt 203.4 lb

## 2023-11-29 DIAGNOSIS — J40 Bronchitis, not specified as acute or chronic: Secondary | ICD-10-CM | POA: Diagnosis not present

## 2023-11-29 DIAGNOSIS — J329 Chronic sinusitis, unspecified: Secondary | ICD-10-CM

## 2023-11-29 DIAGNOSIS — J4530 Mild persistent asthma, uncomplicated: Secondary | ICD-10-CM | POA: Diagnosis not present

## 2023-11-29 MED ORDER — DOXYCYCLINE HYCLATE 100 MG PO TABS: 100.0000 mg | ORAL_TABLET | Freq: Two times a day (BID) | ORAL | 0 refills | Status: DC

## 2023-11-29 MED ORDER — PREDNISONE 10 MG PO TABS: ORAL_TABLET | ORAL | 0 refills | Status: DC

## 2023-11-29 NOTE — Patient Instructions (Addendum)
-  ASTHMA AND ALLERGIC RHINITIS: Your asthma and allergic rhinitis are stable with your current medications: Symbicort  (two puffs in the morning and evening), Zyrtec, and Astelin  nasal spray. Continue with this regimen as it is managing your symptoms well.  -SINO-BRONCHITIS: Sino-bronchitis is an inflammation of the sinuses and bronchial tubes, causing cough, mucus production, and wheezing. You will start a 7-day course of Doxycycline  to treat the infection. Continue taking Mucinex to help with mucus, and use a flutter valve three times a day to clear chest congestion. If your symptoms worsen, consider taking Prednisone  and notify us  via MyChart if you do.  Orders: Flutter valve   Follow-up If symptoms do not improve or worsen

## 2024-01-01 ENCOUNTER — Telehealth: Payer: Self-pay | Admitting: Family Medicine

## 2024-01-01 ENCOUNTER — Ambulatory Visit: Payer: Self-pay | Admitting: Family Medicine

## 2024-01-01 NOTE — Telephone Encounter (Signed)
Chief Complaint: Dizziness at night Symptoms: Dizziness Frequency: 1-2 weeks Pertinent Negatives: Patient denies relief Disposition: [] ED /[] Urgent Care (no appt availability in office) / [x] Appointment(In office/virtual)/ []  [facility]/ [] Home Care/ [] Refused Recommended Disposition /[] [facility]/ []  Follow-up with PCP Additional Notes: Patient called in to report episodes of dizziness ongoing for 1-2 weeks. Patient stated she has been having episodes of dizziness only at night when she is changing positions in bed. Patient stated that she has to close her eyes for relief. Patient stated that dizzy episodes do not happen during the day. Patient reported she is able to walk normally. Patient stated she has had vertigo in the past. Patient stated she does not have issues with her BP. Patient denied headache, weakness, facial changes, difficulty breathing, chest pain and blurred vision. Patient is not experiencing dizziness at this time. This RN advised patient to be seen within 24 hours. This RN scheduled patient with PCP in office early tomorrow morning. This RN advised patient to call back for worsening symptoms. Patient complied.   Reason for Disposition  [1] NO dizziness now AND [2] age > 62  Answer Assessment - Initial Assessment Questions 1. DESCRIPTION: "Describe your dizziness."     States she has to close her eyes and the dizziness goes away 2. VERTIGO: "Do you feel like either you or the room is spinning or tilting?"      Denies 3. LIGHTHEADED: "Do you feel lightheaded?" (e.g., somewhat faint, woozy, weak upon standing)     Denies 4. SEVERITY: "How bad is it?"  "Can you walk?"   - MILD: Feels slightly dizzy and unsteady, but is walking normally.   - MODERATE: Feels unsteady when walking, but not falling; interferes with normal activities (e.g., school, work).   - SEVERE: Unable to walk without falling, or requires assistance to walk without falling.     States  she is still able to walk normally 5. ONSET:  "When did the dizziness begin?"     1-2 weeks  6. AGGRAVATING FACTORS: "Does anything make it worse?" (e.g., standing, change in head position)     States it only happens at night in bed when she is turning  7. CAUSE: "What do you think is causing the dizziness?"     Denies, but states she has vertigo 8. RECURRENT SYMPTOM: "Have you had dizziness before?" If Yes, ask: "When was the last time?" "What happened that time?"     Yes, states it has been several years 9. OTHER SYMPTOMS: "Do you have any other symptoms?" (e.g., headache, weakness, numbness, vomiting, earache)     Denies HA, denies difficulty breathing, denies chest pain, denies nausea, denies weakness, denies facial changes, denies blurred vision  Protocols used: Dizziness - Vertigo-A-AH

## 2024-01-01 NOTE — Telephone Encounter (Signed)
FYI: This call has been transferred to Access Nurse. Once the result note has been entered staff can address the message at that time.  Patient called in with the following symptoms:  Red Word:dizziness    Please advise at [facility] [phone]  Message is routed to Provider Pool and [facility] Triage   Pt scheduled appt with Dr. Ermalene Searing via [facility] Reaper for Wed, 2/19. Spoke to pt, pt stated she wanted to discuss her recent episodes of veritgo whenever she lays down at night, weight concerns & trigger finger issues as well. Pt states she only experiences vertigo when laying down. Transferred pt to triage nurse.

## 2024-01-01 NOTE — Telephone Encounter (Signed)
Please see nurse triage note on 01/01/24. Pt already has appt with Dr Ermalene Searing 01/02/24 at 8:20. Sending FYI to Dr Ermalene Searing.

## 2024-01-02 ENCOUNTER — Ambulatory Visit (INDEPENDENT_AMBULATORY_CARE_PROVIDER_SITE_OTHER): Payer: Medicare Other | Admitting: Family Medicine

## 2024-01-02 ENCOUNTER — Encounter: Payer: Self-pay | Admitting: Family Medicine

## 2024-01-02 VITALS — BP 120/60 | HR 85 | Temp 97.9°F | Ht 65.0 in | Wt 207.0 lb

## 2024-01-02 DIAGNOSIS — E66811 Obesity, class 1: Secondary | ICD-10-CM

## 2024-01-02 DIAGNOSIS — M65342 Trigger finger, left ring finger: Secondary | ICD-10-CM | POA: Diagnosis not present

## 2024-01-02 DIAGNOSIS — R42 Dizziness and giddiness: Secondary | ICD-10-CM | POA: Insufficient documentation

## 2024-01-02 DIAGNOSIS — E1169 Type 2 diabetes mellitus with other specified complication: Secondary | ICD-10-CM | POA: Diagnosis not present

## 2024-01-02 DIAGNOSIS — E6609 Other obesity due to excess calories: Secondary | ICD-10-CM

## 2024-01-02 DIAGNOSIS — Z6834 Body mass index (BMI) 34.0-34.9, adult: Secondary | ICD-10-CM

## 2024-01-02 DIAGNOSIS — Z7985 Long-term (current) use of injectable non-insulin antidiabetic drugs: Secondary | ICD-10-CM

## 2024-01-02 LAB — POCT GLYCOSYLATED HEMOGLOBIN (HGB A1C): Hemoglobin A1C: 8.6 % — AB (ref 4.0–5.6)

## 2024-01-02 LAB — MICROALBUMIN / CREATININE URINE RATIO
Creatinine,U: 72.4 mg/dL
Microalb Creat Ratio: 9.7 mg/g (ref 0.0–30.0)
Microalb, Ur: <0.7 mg/dL (ref 0.0–1.9)

## 2024-01-02 LAB — HM DIABETES FOOT EXAM

## 2024-01-02 MED ORDER — TIRZEPATIDE 5 MG/0.5ML ~~LOC~~ SOAJ: 5.0000 mg | SUBCUTANEOUS | 11 refills | Status: DC

## 2024-01-02 NOTE — Assessment & Plan Note (Addendum)
Chronic.. inadequate  poor control.   increase Mounjaro to 5 mg weekly.   Re-eval in 3 months with A1C.

## 2024-01-02 NOTE — Progress Notes (Signed)
Patient ID: Carrie Perkins, female    DOB: 1951-08-29, 73 y.o.   MRN: [id number]  This visit was conducted in person.  BP 120/60 (BP Location: Left Arm, Patient Position: Sitting, Cuff Size: Large)   Pulse 85   Temp 97.9 F (36.6 C) (Temporal)   Ht 5\' 5"  (1.651 m)   Wt 207 lb (93.9 kg)   SpO2 96%   BMI 34.45 kg/m    CC:  Chief Complaint  Patient presents with   Trigger Finger    Left Ring Finger   Obesity    Discuss Weight Loss-Currently on Mounjaro 2.5 mg   Dizziness    At night when she goes to bed    Subjective:   HPI: Carrie Perkins is a 73 y.o. female presenting on 01/02/2024 for Trigger Finger (Left Ring Finger), Obesity (Discuss Weight Loss-Currently on Mounjaro 2.5 mg), and Dizziness (At night when she goes to bed)   She presents with multiple issues.   She is having vertigo when rolling over in bed at night. This is the only time she is noting dizziness.  Better when she closes her eyes a nd waits. Has improved some  last night.  No N/V, no chest pain, No SOb, no neuro changes.  No hearing loss.    Onogin x 1.5 weeks.  No issgue going sitting to standing   Obesity  and DM 2  On Mounjaro 2.5 mg weekly... minimal weight loss, not checking blood sugar.    Planning water aerobics three days a week.  A1C today 8.6 Lab Results  Component Value Date   HGBA1C 7.5 (A) 08/11/2023  Body mass index is 34.45 kg/m.  Has trigger finger, ring finger.. ongoing for 2 months... she is not interested in injecting area. Trying to wear brace and limit use.     Relevant past medical, surgical, family and social history reviewed and updated as indicated. Interim medical history since our last visit reviewed. Allergies and medications reviewed and updated. Outpatient Medications Prior to Visit  Medication Sig Dispense Refill   albuterol (VENTOLIN HFA) 108 (90 Base) MCG/ACT inhaler INHALE 1-2 PUFFS BY MOUTH EVERY 6 HOURS AS NEEDED FOR WHEEZE OR SHORTNESS OF BREATH 18 each 2    aspirin EC 81 MG tablet Take 81 mg by mouth daily.     Azelastine HCl 137 MCG/SPRAY SOLN PLACE 1 SPRAY INTO BOTH NOSTRILS 2 (TWO) TIMES DAILY. USE IN EACH NOSTRIL AS DIRECTED 30 mL 5   Beclomethasone Dipropionate (QNASL) 80 MCG/ACT AERS Place 2 each into the nose at bedtime as needed.     blood glucose meter kit and supplies Dispense based on patient and insurance preference. Use up to four times daily as directed. (FOR ICD-10 E11.9). 1 each 0   budesonide-formoterol (SYMBICORT) 160-4.5 MCG/ACT inhaler Inhale 2 puffs into the lungs 2 (two) times daily as needed.     cetirizine (ZYRTEC) 10 MG chewable tablet Chew 10 mg by mouth 2 (two) times daily.     Cholecalciferol 1000 UNITS capsule Take 1,000 Units by mouth daily.     dextromethorphan-guaiFENesin (MUCINEX DM) 30-600 MG per 12 hr tablet Take 2 tablets by mouth every 12 (twelve) hours.     Glucose Blood (BLOOD GLUCOSE TEST STRIPS) STRP Test blood sugar as directed. (FOR ICD-10 E11.9). 100 strip 3   Microlet Lancets MISC Use to test blood sugar as directed. (FOR ICD-10 E11.9). 100 each 3   NON FORMULARY doterra essential oil Mixes in water  Red Yeast Rice 600 MG CAPS Take by mouth.     Turmeric (QC TUMERIC COMPLEX PO) Take 1 tablet by mouth daily.     Zinc Sulfate (ZINC 15 PO) Take 1 tablet by mouth daily. Not sure of the dose     MOUNJARO 2.5 MG/0.5ML Pen Inject 2.5 mg into the skin once a week.     azithromycin (ZITHROMAX) 250 MG tablet 2 tab po x 1 day then 1 tab po daily 6 tablet 0   benzonatate (TESSALON) 200 MG capsule Take 1 capsule (200 mg total) by mouth 2 (two) times daily as needed for cough. 20 capsule 0   doxycycline (VIBRA-TABS) 100 MG tablet Take 1 tablet (100 mg total) by mouth 2 (two) times daily. 14 tablet 0   predniSONE (DELTASONE) 10 MG tablet 4 tabs for 2 days, then 3 tabs for 2 days, 2 tabs for 2 days, then 1 tab for 2 days, then stop 20 tablet 0   Semaglutide, 2 MG/DOSE, 8 MG/3ML SOPN Inject 2 mg as directed once a  week. 9 mL 3   No facility-administered medications prior to visit.     Per HPI unless specifically indicated in ROS section below Review of Systems  Constitutional:  Negative for fatigue and fever.  HENT:  Negative for congestion.   Eyes:  Negative for pain.  Respiratory:  Negative for cough and shortness of breath.   Cardiovascular:  Negative for chest pain, palpitations and leg swelling.  Gastrointestinal:  Negative for abdominal pain.  Genitourinary:  Negative for dysuria and vaginal bleeding.  Musculoskeletal:  Negative for back pain.  Neurological:  Negative for syncope, light-headedness and headaches.  Psychiatric/Behavioral:  Negative for dysphoric mood.    Objective:  BP 120/60 (BP Location: Left Arm, Patient Position: Sitting, Cuff Size: Large)   Pulse 85   Temp 97.9 F (36.6 C) (Temporal)   Ht 5\' 5"  (1.651 m)   Wt 207 lb (93.9 kg)   SpO2 96%   BMI 34.45 kg/m   Wt Readings from Last 3 Encounters:  01/02/24 207 lb (93.9 kg)  11/29/23 203 lb 6.4 oz (92.3 kg)  08/11/23 206 lb 6.4 oz (93.6 kg)      Physical Exam Constitutional:      General: She is not in acute distress.    Appearance: Normal appearance. She is well-developed. She is obese. She is not ill-appearing or toxic-appearing.  HENT:     Head: Normocephalic.     Right Ear: Hearing, tympanic membrane, ear canal and external ear normal. Tympanic membrane is not erythematous, retracted or bulging.     Left Ear: Hearing, tympanic membrane, ear canal and external ear normal. Tympanic membrane is not erythematous, retracted or bulging.     Nose: No mucosal edema or rhinorrhea.     Right Sinus: No maxillary sinus tenderness or frontal sinus tenderness.     Left Sinus: No maxillary sinus tenderness or frontal sinus tenderness.     Mouth/Throat:     Pharynx: Uvula midline.  Eyes:     General: Lids are normal. Lids are everted, no foreign bodies appreciated.     Conjunctiva/sclera: Conjunctivae normal.      Pupils: Pupils are equal, round, and reactive to light.  Neck:     Thyroid: No thyroid mass or thyromegaly.     Vascular: No carotid bruit.     Trachea: Trachea normal.  Cardiovascular:     Rate and Rhythm: Normal rate and regular rhythm.     Pulses:  Normal pulses.     Heart sounds: Normal heart sounds, S1 normal and S2 normal. No murmur heard.    No friction rub. No gallop.  Pulmonary:     Effort: Pulmonary effort is normal. No tachypnea or respiratory distress.     Breath sounds: Normal breath sounds. No decreased breath sounds, wheezing, rhonchi or rales.  Abdominal:     General: Bowel sounds are normal.     Palpations: Abdomen is soft.     Tenderness: There is no abdominal tenderness.  Musculoskeletal:     Cervical back: Normal range of motion and neck supple.     Comments:  Nodle base of left ring finger, triggering finger, no redness  Skin:    General: Skin is warm and dry.     Findings: No rash.  Neurological:     Mental Status: She is alert and oriented to person, place, and time.     GCS: GCS eye subscore is 4. GCS verbal subscore is 5. GCS motor subscore is 6.     Cranial Nerves: No cranial nerve deficit.     Sensory: No sensory deficit.     Motor: No abnormal muscle tone.     Coordination: Coordination normal.     Gait: Gait normal.     Deep Tendon Reflexes: Reflexes are normal and symmetric.     Comments: Nml cerebellar exam   No papilledema  Psychiatric:        Mood and Affect: Mood is not anxious or depressed.        Speech: Speech normal.        Behavior: Behavior normal. Behavior is cooperative.        Thought Content: Thought content normal.        Cognition and Memory: Memory is not impaired. She does not exhibit impaired recent memory or impaired remote memory.        Judgment: Judgment normal.       Results for orders placed or performed in visit on 01/02/24  HM DIABETES FOOT EXAM   Collection Time: 01/02/24 12:00 AM  Result Value Ref Range   HM  Diabetic Foot Exam done     Assessment and Plan  Vertigo Assessment & Plan:  Acute.. most consistent with BPPV.  No evidence of new neurochange.  Normal cerebellar exam. Recommend treatment with desensitization exercises and modified Home Epley.  If symptoms not continuing to improve as expected will refer to physical therapy for balance retraining.   Type 2 diabetes mellitus with other specified complication, without long-term current use of insulin (HCC) Assessment & Plan:  Chronic.. inadequate  poor control.   increase Mounjaro to 5 mg weekly.   Re-eval in 3 months with A1C.  Orders: -     POCT glycosylated hemoglobin (Hb A1C) -     Microalbumin / creatinine urine ratio  Class 1 obesity due to excess calories with body mass index (BMI) of 34.0 to 34.9 in adult, unspecified whether serious comorbidity present  Trigger finger, left ring finger Assessment & Plan: Acute, patient current not currently interested in referral for trigger point injection.  Discussed limited effectiveness of immobilization but she would like to try this.   Other orders -     Tirzepatide; Inject 5 mg into the skin once a week.  Dispense: 2 mL; Refill: 11    Return in about 3 months (around 03/31/2024) for diabetes follow up with labs prior.   [name], MD

## 2024-01-02 NOTE — Patient Instructions (Addendum)
Start home desensitization exercise for BPPV.4 Increase Mounjaro to 5 mg weekly.  Continue working on exercise and low carb diet.  Set up yearly eye exam for diabetes and have the opthalmologist send [place] a copy of the evaluation for the chart.  Get second shingles vaccine and RSV vaccine.

## 2024-01-02 NOTE — Assessment & Plan Note (Signed)
Acute.. most consistent with BPPV.  No evidence of new neurochange.  Normal cerebellar exam. Recommend treatment with desensitization exercises and modified Home Epley.  If symptoms not continuing to improve as expected will refer to physical therapy for balance retraining.

## 2024-01-02 NOTE — Assessment & Plan Note (Signed)
Acute, patient current not currently interested in referral for trigger point injection.  Discussed limited effectiveness of immobilization but she would like to try this.

## 2024-01-03 ENCOUNTER — Ambulatory Visit: Payer: Medicare Other | Admitting: Family Medicine

## 2024-01-12 ENCOUNTER — Other Ambulatory Visit (HOSPITAL_COMMUNITY): Payer: Self-pay

## 2024-01-12 ENCOUNTER — Telehealth: Payer: Self-pay | Admitting: Pharmacy Technician

## 2024-01-12 NOTE — Telephone Encounter (Signed)
 Pharmacy Patient Advocate Encounter   Received notification from CoverMyMeds that prior authorization for OZEMPIC 5MG /0.5ML PEN is required/requested.   Insurance verification completed.   The patient is insured through [facility] .   Per test claim: PA required; PA submitted to above mentioned insurance via CoverMyMeds Key/confirmation #/EOC [id number] Status is pending

## 2024-01-15 ENCOUNTER — Other Ambulatory Visit (HOSPITAL_COMMUNITY): Payer: Self-pay

## 2024-01-15 NOTE — Telephone Encounter (Signed)
 Pharmacy Patient Advocate Encounter  Received notification from [facility] that Prior Authorization for [facility] 5MG /0.5ML PEN has been APPROVED from 01/12/2024 to 01/11/2025. Unable to obtain price due to refill too soon rejection, last fill date 01/13/2024 next available fill date03/22/2025.   PA #/Case ID/Reference #: [id number]

## 2024-01-16 ENCOUNTER — Ambulatory Visit: Payer: Medicare HMO | Admitting: Adult Health

## 2024-01-23 ENCOUNTER — Ambulatory Visit: Payer: Medicare HMO | Admitting: Adult Health

## 2024-01-29 ENCOUNTER — Encounter: Payer: Self-pay | Admitting: Pharmacist

## 2024-01-29 NOTE — Progress Notes (Signed)
 Pharmacy Quality Measure Review  This patient is appearing on a report for failing the adherence measure for diabetes medications in 2024.  Patient is at risk for failing the measure this calendar year.   Medication(s): tirzepatide Last prescription: Mounjaro 5 mg sq weekly (11RF) PA approved through 01/11/25 Pharmacy: [id number] - Ph: [phone] Rx#: [id number]  Pharmacy claims in Epic suggest this medication was never dispensed, though [facility] automated phone system confirms medication was picked up by patient.   Next visit:  - PCP 04/02/24

## 2024-02-15 ENCOUNTER — Telehealth: Payer: Self-pay

## 2024-02-15 NOTE — Telephone Encounter (Signed)
 Attempted to contact patient regarding HM gaps, left message for patient to return call   DM eye exam- Has patient seen an eye doc in the last year for diabetic eye exam? Where did they go? Need to obtain records.

## 2024-02-22 ENCOUNTER — Ambulatory Visit: Admitting: Adult Health

## 2024-04-02 ENCOUNTER — Encounter: Payer: Self-pay | Admitting: Family Medicine

## 2024-04-02 ENCOUNTER — Ambulatory Visit (INDEPENDENT_AMBULATORY_CARE_PROVIDER_SITE_OTHER): Payer: Medicare Other | Admitting: Family Medicine

## 2024-04-02 VITALS — BP 120/60 | HR 73 | Temp 97.7°F | Ht 65.0 in | Wt 201.4 lb

## 2024-04-02 DIAGNOSIS — Z7984 Long term (current) use of oral hypoglycemic drugs: Secondary | ICD-10-CM

## 2024-04-02 DIAGNOSIS — E1169 Type 2 diabetes mellitus with other specified complication: Secondary | ICD-10-CM

## 2024-04-02 LAB — POCT GLYCOSYLATED HEMOGLOBIN (HGB A1C): Hemoglobin A1C: 6.7 % — AB (ref 4.0–5.6)

## 2024-04-02 NOTE — Assessment & Plan Note (Addendum)
 Chronic.. Significant improvement in control..    On Mounjaro to 5 mg weekly. Offered incraese in dose for more weight management.. she will ad back exercsie and let me know if she decides to increase dose   Re-eval in 6 months with A1C.

## 2024-04-02 NOTE — Progress Notes (Signed)
 Patient ID: Carrie Perkins, female    DOB: 1951-09-11, 73 y.o.   MRN: [id number]  This visit was conducted in person.  BP 120/60   Pulse 73   Temp 97.7 F (36.5 C) (Temporal)   Ht 5\' 5"  (1.651 m)   Wt 201 lb 6 oz (91.3 kg)   SpO2 96%   BMI 33.51 kg/m    CC:  Chief Complaint  Patient presents with   Diabetes    Subjective:   HPI: Carrie Perkins is a 73 y.o. female presenting on 04/02/2024 for Diabetes   Obesity  and DM 2  On Mounjaro 5 mg weekly... minimal weight loss, not checking blood sugar.    Planning water aerobics three days a week into the summer months.   Has noted significant decrease in appetite.  Has cut out creamer.  Has small redness and itching for 1-2 days after injection... no other allergic SE.  No D/C. No N Lab Results  Component Value Date   HGBA1C 6.7 (A) 04/02/2024  Body mass index is 33.51 kg/m. Wt Readings from Last 3 Encounters:  04/02/24 201 lb 6 oz (91.3 kg)  01/02/24 207 lb (93.9 kg)  11/29/23 203 lb 6.4 oz (92.3 kg)    Has trigger finger, ring finger.. ongoing for 5 months... she is not interested in injecting area. Trying to wear brace and limit use.  SHe plans to see surgeon.     Relevant past medical, surgical, family and social history reviewed and updated as indicated. Interim medical history since our last visit reviewed. Allergies and medications reviewed and updated. Outpatient Medications Prior to Visit  Medication Sig Dispense Refill   albuterol  (VENTOLIN  HFA) 108 (90 Base) MCG/ACT inhaler INHALE 1-2 PUFFS BY MOUTH EVERY 6 HOURS AS NEEDED FOR WHEEZE OR SHORTNESS OF BREATH 18 each 2   aspirin EC 81 MG tablet Take 81 mg by mouth daily.     Azelastine  HCl 137 MCG/SPRAY SOLN PLACE 1 SPRAY INTO BOTH NOSTRILS 2 (TWO) TIMES DAILY. USE IN EACH NOSTRIL AS DIRECTED 30 mL 5   Beclomethasone Dipropionate  (QNASL ) 80 MCG/ACT AERS Place 2 each into the nose at bedtime as needed.     blood glucose meter kit and supplies Dispense based on  patient and insurance preference. Use up to four times daily as directed. (FOR ICD-10 E11.9). 1 each 0   budesonide -formoterol  (SYMBICORT ) 160-4.5 MCG/ACT inhaler Inhale 2 puffs into the lungs 2 (two) times daily as needed.     cetirizine (ZYRTEC) 10 MG chewable tablet Chew 10 mg by mouth 2 (two) times daily.     Cholecalciferol 1000 UNITS capsule Take 1,000 Units by mouth daily.     dextromethorphan-guaiFENesin (MUCINEX DM) 30-600 MG per 12 hr tablet Take 2 tablets by mouth every 12 (twelve) hours.     Glucose Blood (BLOOD GLUCOSE TEST STRIPS) STRP Test blood sugar as directed. (FOR ICD-10 E11.9). 100 strip 3   Microlet Lancets MISC Use to test blood sugar as directed. (FOR ICD-10 E11.9). 100 each 3   NON FORMULARY doterra essential oil Mixes in water     Red Yeast Rice 600 MG CAPS Take by mouth.     tirzepatide (MOUNJARO) 5 MG/0.5ML Pen Inject 5 mg into the skin once a week. 2 mL 11   Turmeric (QC TUMERIC COMPLEX PO) Take 1 tablet by mouth daily.     Zinc Sulfate (ZINC 15 PO) Take 1 tablet by mouth daily. Not sure of the dose  No facility-administered medications prior to visit.     Per HPI unless specifically indicated in ROS section below Review of Systems  Constitutional:  Negative for fatigue and fever.  HENT:  Negative for congestion.   Eyes:  Negative for pain.  Respiratory:  Negative for cough and shortness of breath.   Cardiovascular:  Negative for chest pain, palpitations and leg swelling.  Gastrointestinal:  Negative for abdominal pain.  Genitourinary:  Negative for dysuria and vaginal bleeding.  Musculoskeletal:  Negative for back pain.  Neurological:  Negative for syncope, light-headedness and headaches.  Psychiatric/Behavioral:  Negative for dysphoric mood.    Objective:  BP 120/60   Pulse 73   Temp 97.7 F (36.5 C) (Temporal)   Ht 5\' 5"  (1.651 m)   Wt 201 lb 6 oz (91.3 kg)   SpO2 96%   BMI 33.51 kg/m   Wt Readings from Last 3 Encounters:  04/02/24 201 lb 6  oz (91.3 kg)  01/02/24 207 lb (93.9 kg)  11/29/23 203 lb 6.4 oz (92.3 kg)      Physical Exam Constitutional:      General: She is not in acute distress.    Appearance: Normal appearance. She is well-developed. She is obese. She is not ill-appearing or toxic-appearing.  HENT:     Head: Normocephalic.     Right Ear: Hearing, tympanic membrane, ear canal and external ear normal. Tympanic membrane is not erythematous, retracted or bulging.     Left Ear: Hearing, tympanic membrane, ear canal and external ear normal. Tympanic membrane is not erythematous, retracted or bulging.     Nose: No mucosal edema or rhinorrhea.     Right Sinus: No maxillary sinus tenderness or frontal sinus tenderness.     Left Sinus: No maxillary sinus tenderness or frontal sinus tenderness.     Mouth/Throat:     Pharynx: Uvula midline.  Eyes:     General: Lids are normal. Lids are everted, no foreign bodies appreciated.     Conjunctiva/sclera: Conjunctivae normal.     Pupils: Pupils are equal, round, and reactive to light.  Neck:     Thyroid : No thyroid  mass or thyromegaly.     Vascular: No carotid bruit.     Trachea: Trachea normal.  Cardiovascular:     Rate and Rhythm: Normal rate and regular rhythm.     Pulses: Normal pulses.     Heart sounds: Normal heart sounds, S1 normal and S2 normal. No murmur heard.    No friction rub. No gallop.  Pulmonary:     Effort: Pulmonary effort is normal. No tachypnea or respiratory distress.     Breath sounds: Normal breath sounds. No decreased breath sounds, wheezing, rhonchi or rales.  Abdominal:     General: Bowel sounds are normal.     Palpations: Abdomen is soft.     Tenderness: There is no abdominal tenderness.  Musculoskeletal:     Cervical back: Normal range of motion and neck supple.     Comments:  Nodle base of left ring finger, triggering finger, no redness  Skin:    General: Skin is warm and dry.     Findings: No rash.  Neurological:     Mental Status:  She is alert and oriented to person, place, and time.     GCS: GCS eye subscore is 4. GCS verbal subscore is 5. GCS motor subscore is 6.     Cranial Nerves: No cranial nerve deficit.     Sensory: No sensory deficit.  Motor: No abnormal muscle tone.     Coordination: Coordination normal.     Gait: Gait normal.     Deep Tendon Reflexes: Reflexes are normal and symmetric.     Comments: Nml cerebellar exam   No papilledema  Psychiatric:        Mood and Affect: Mood is not anxious or depressed.        Speech: Speech normal.        Behavior: Behavior normal. Behavior is cooperative.        Thought Content: Thought content normal.        Cognition and Memory: Memory is not impaired. She does not exhibit impaired recent memory or impaired remote memory.        Judgment: Judgment normal.     She does have  a thorn in tip of right thumb.. minimal associated redness, no pustule..right recommended warm water soaks, antibiotic ointemnt, time.    Results for orders placed or performed in visit on 04/02/24  POCT glycosylated hemoglobin (Hb A1C)   Collection Time: 04/02/24  8:32 AM  Result Value Ref Range   Hemoglobin A1C 6.7 (A) 4.0 - 5.6 %   HbA1c POC (<> result, manual entry)     HbA1c, POC (prediabetic range)     HbA1c, POC (controlled diabetic range)      Assessment and Plan  Type 2 diabetes mellitus with other specified complication, without long-term current use of insulin (HCC) Assessment & Plan:  Chronic.. Significant improvement in control..    On Mounjaro to 5 mg weekly. Offered incraese in dose for more weight management.. she will ad back exercsie and let me know if she decides to increase dose   Re-eval in 6 months with A1C.  Orders: -     POCT glycosylated hemoglobin (Hb A1C)    Return in about 6 months (around 10/03/2024) for phone AMW,  fasting labs then CPE with me.   Herby Lolling, MD

## 2024-04-16 ENCOUNTER — Encounter: Payer: Self-pay | Admitting: Adult Health

## 2024-04-16 ENCOUNTER — Ambulatory Visit: Admitting: Adult Health

## 2024-04-16 VITALS — BP 107/71 | HR 61 | Ht 65.0 in | Wt 202.2 lb

## 2024-04-16 DIAGNOSIS — J4530 Mild persistent asthma, uncomplicated: Secondary | ICD-10-CM

## 2024-04-16 DIAGNOSIS — G4733 Obstructive sleep apnea (adult) (pediatric): Secondary | ICD-10-CM

## 2024-04-16 DIAGNOSIS — J4540 Moderate persistent asthma, uncomplicated: Secondary | ICD-10-CM | POA: Insufficient documentation

## 2024-04-16 DIAGNOSIS — J45909 Unspecified asthma, uncomplicated: Secondary | ICD-10-CM | POA: Insufficient documentation

## 2024-04-16 DIAGNOSIS — J302 Other seasonal allergic rhinitis: Secondary | ICD-10-CM

## 2024-04-16 MED ORDER — ALBUTEROL SULFATE HFA 108 (90 BASE) MCG/ACT IN AERS: INHALATION_SPRAY | RESPIRATORY_TRACT | 5 refills | Status: AC

## 2024-04-16 MED ORDER — BUDESONIDE-FORMOTEROL FUMARATE 160-4.5 MCG/ACT IN AERO: 2.0000 | INHALATION_SPRAY | Freq: Two times a day (BID) | RESPIRATORY_TRACT | 12 refills | Status: AC

## 2024-04-16 NOTE — Assessment & Plan Note (Addendum)
 Mild persistent asthma appears to be under good control.  Continue on current maintenance regimen.  Asthma action plan discussed. Chest x-ray September 2024 showed coarse interstitial markings.  Patient has minimal symptom burden.  Previous DLCO was normal.  If develops symptoms of increased shortness of breath -could consider repeat PFTs and high-resolution CT chest to further evaluate.  Plan  Patient Instructions  Continue on Symbicort  2 puffs Twice daily, rinse after use.  Zyrtec 10mg  At bedtime As needed   Astelin  and QNASAL nasal spray As needed   ProAir  As needed  Wheezing .  Activity as tolerated.  Follow up in office in 1 year with Dr. Diania Perkins or Carrie Chard NP and As needed  -30 min slot  Please contact office for sooner follow up if symptoms do not improve or worsen or seek emergency care

## 2024-04-16 NOTE — Patient Instructions (Addendum)
 Continue on Symbicort  2 puffs Twice daily, rinse after use.  Zyrtec 10mg  At bedtime As needed   Astelin  and QNASAL nasal spray As needed   ProAir  As needed  Wheezing .  Activity as tolerated.  Follow up in office in 1 year with Dr. Diania Fortes or Dionne Knoop NP and As needed  -30 min slot  Please contact office for sooner follow up if symptoms do not improve or worsen or seek emergency care

## 2024-04-16 NOTE — Progress Notes (Signed)
 @Patient  ID: Carrie Perkins, female    DOB: Nov 29, 1950, 73 y.o.   MRN: [id number]  Chief Complaint  Patient presents with   Follow-up    Referring provider: Judithann Novas, MD  HPI: 73 yo female never smoker followed for Asthma and Allergic Rhinitis    TEST/EVENTS :  OSA> she had sleep study 2008 w/ AHI=22 & desat tp 80%; she declined CPAP    2D echo 2015 EF 60-65%, 2 diastolic dysfunction   Chest x-ray January 13, 2020 showed clear lungs.   PFTs August 28, 2020 showed FEV1 at 83%, ratio 87, FVC 72%, no significant bronchodilator response, DLCO 92%.  04/16/2024 Follow up: Asthma and Allergic Rhinitis Patient presents for a 48-month follow-up.  Patient is followed for mild persistent asthma and allergic rhinitis.  She says overall her breathing has been doing well.  She did have cold-like symptoms about 10 days ago.  Started with some sore throat sinus drainage cough and congestion symptoms have been slowly improving.  She denies any fever or discolored mucus.  She denies any increased albuterol  use.  Remains on Symbicort  twice daily.  Takes Zyrtec Qnasl  and Astelin  as needed.  Chest x-ray last September 2024 showed no acute process.  Mild chronic interstitial markings.  Patient denies any significant shortness of breath.  Does state that if she walks a very long distance especially if she is on vacation she will get winded.  Denies any significant wheezing.  Previous PFTs in 2021 showed mild restriction with no airflow obstruction and normal diffusing capacity. She is going to [place] on vacation in August.   Allergies  Allergen Reactions   Amoxicillin-Pot Clavulanate     REACTION: severe abd pain---intolerant   Biaxin [Clarithromycin] Other (See Comments)    Caused severe stomach pain     Immunization History  Administered Date(s) Administered   Fluad Trivalent(High Dose 65+) 07/24/2023   Influenza Split 12/12/2011   Influenza Whole 08/14/2009   PFIZER Comirnaty(Gray Top)Covid-19  Tri-Sucrose Vaccine 08/28/2020   PFIZER(Purple Top)SARS-COV-2 Vaccination 12/20/2019, 01/14/2020   PNEUMOCOCCAL CONJUGATE-20 07/06/2021   Pneumococcal Polysaccharide-23 08/02/2022   Tdap 11/14/2010    Past Medical History:  Diagnosis Date   Allergic rhinitis, cause unspecified    Obstructive sleep apnea (adult) (pediatric)    Osteoarthrosis, unspecified whether generalized or localized, unspecified site    Other and unspecified hyperlipidemia    Other dyspnea and respiratory abnormality    Overweight(278.02)    Unspecified asthma(493.90)    Unspecified urinary incontinence     Tobacco History: Social History   Tobacco Use  Smoking Status Never   Passive exposure: Past  Smokeless Tobacco Never   Counseling given: No   Outpatient Medications Prior to Visit  Medication Sig Dispense Refill   aspirin EC 81 MG tablet Take 81 mg by mouth daily.     Azelastine  HCl 137 MCG/SPRAY SOLN PLACE 1 SPRAY INTO BOTH NOSTRILS 2 (TWO) TIMES DAILY. USE IN EACH NOSTRIL AS DIRECTED 30 mL 5   Beclomethasone Dipropionate  (QNASL ) 80 MCG/ACT AERS Place 2 each into the nose at bedtime as needed.     blood glucose meter kit and supplies Dispense based on patient and insurance preference. Use up to four times daily as directed. (FOR ICD-10 E11.9). 1 each 0   cetirizine (ZYRTEC) 10 MG chewable tablet Chew 10 mg by mouth 2 (two) times daily.     Cholecalciferol 1000 UNITS capsule Take 1,000 Units by mouth daily.     dextromethorphan-guaiFENesin (MUCINEX DM) 30-600  MG per 12 hr tablet Take 2 tablets by mouth every 12 (twelve) hours.     Glucose Blood (BLOOD GLUCOSE TEST STRIPS) STRP Test blood sugar as directed. (FOR ICD-10 E11.9). 100 strip 3   Microlet Lancets MISC Use to test blood sugar as directed. (FOR ICD-10 E11.9). 100 each 3   NON FORMULARY doterra essential oil Mixes in water     Red Yeast Rice 600 MG CAPS Take by mouth.     tirzepatide (MOUNJARO) 5 MG/0.5ML Pen Inject 5 mg into the skin once a  week. 2 mL 11   Turmeric (QC TUMERIC COMPLEX PO) Take 1 tablet by mouth daily.     Zinc Sulfate (ZINC 15 PO) Take 1 tablet by mouth daily. Not sure of the dose     albuterol  (VENTOLIN  HFA) 108 (90 Base) MCG/ACT inhaler INHALE 1-2 PUFFS BY MOUTH EVERY 6 HOURS AS NEEDED FOR WHEEZE OR SHORTNESS OF BREATH 18 each 2   budesonide -formoterol  (SYMBICORT ) 160-4.5 MCG/ACT inhaler Inhale 2 puffs into the lungs 2 (two) times daily as needed.     No facility-administered medications prior to visit.     Review of Systems:   Constitutional:   No  weight loss, night sweats,  Fevers, chills, fatigue, or  lassitude.  HEENT:   No headaches,  Difficulty swallowing,  Tooth/dental problems, or  Sore throat,                No sneezing, itching, ear ache, +nasal congestion, post nasal drip,   CV:  No chest pain,  Orthopnea, PND, swelling in lower extremities, anasarca, dizziness, palpitations, syncope.   GI  No heartburn, indigestion, abdominal pain, nausea, vomiting, diarrhea, change in bowel habits, loss of appetite, bloody stools.   Resp: No shortness of breath with exertion or at rest.  No excess mucus, no productive cough,  No non-productive cough,  No coughing up of blood.  No change in color of mucus.  No wheezing.  No chest wall deformity  Skin: no rash or lesions.  GU: no dysuria, change in color of urine, no urgency or frequency.  No flank pain, no hematuria   MS:  No joint pain or swelling.  No decreased range of motion.  No back pain.    Physical Exam  BP 107/71 (BP Location: Left Arm, Patient Position: Sitting, Cuff Size: Large)   Pulse 61   Ht 5\' 5"  (1.651 m)   Wt 202 lb 3.2 oz (91.7 kg)   SpO2 98%   BMI 33.65 kg/m   GEN: A/Ox3; pleasant , NAD, well nourished    HEENT:  [place]/AT,  NOSE-clear, THROAT-clear, no lesions, no postnasal drip or exudate noted.   NECK:  Supple w/ fair ROM; no JVD; normal carotid impulses w/o bruits; no thyromegaly or nodules palpated; no lymphadenopathy.     RESP  Clear  P & A; w/o, wheezes/ rales/ or rhonchi. no accessory muscle use, no dullness to percussion  CARD:  RRR, no m/r/g, no peripheral edema, pulses intact, no cyanosis or clubbing.  GI:   Soft & nt; nml bowel sounds; no organomegaly or masses detected.   Musco: Warm bil, no deformities or joint swelling noted.   Neuro: alert, no focal deficits noted.    Skin: Warm, no lesions or rashes    Lab Results:    BNP No results found for: "BNP"  ProBNP No results found for: "PROBNP"  Imaging: No results found.  Administration History     None  Latest Ref Rng & Units 08/28/2020    9:00 AM  PFT Results  FVC-Pre L 2.24   FVC-Predicted Pre % 72   FVC-Post L 2.24   FVC-Predicted Post % 72   Pre FEV1/FVC % % 84   Post FEV1/FCV % % 87   FEV1-Pre L 1.89   FEV1-Predicted Pre % 80   FEV1-Post L 1.95   DLCO uncorrected ml/min/mmHg 18.53   DLCO UNC% % 92   DLCO corrected ml/min/mmHg 18.53   DLCO COR %Predicted % 92   DLVA Predicted % 120   TLC L 3.91   TLC % Predicted % 75   RV % Predicted % 71     No results found for: "NITRICOXIDE"      Assessment & Plan:   Asthma Mild persistent asthma appears to be under good control.  Continue on current maintenance regimen.  Asthma action plan discussed. Chest x-ray September 2024 showed coarse interstitial markings.  Patient has minimal symptom burden.  Previous DLCO was normal.  If develops symptoms of increased shortness of breath -could consider repeat PFTs and high-resolution CT chest to further evaluate.  Plan  Patient Instructions  Continue on Symbicort  2 puffs Twice daily, rinse after use.  Zyrtec 10mg  At bedtime As needed   Astelin  and QNASAL nasal spray As needed   ProAir  As needed  Wheezing .  Activity as tolerated.  Follow up in office in 1 year with Dr. Diania Fortes or Naiomy Watters NP and As needed  -30 min slot  Please contact office for sooner follow up if symptoms do not improve or worsen or seek  emergency care     Allergic rhinitis Appears under good control.  Continue on current regimen   OSA (obstructive sleep apnea) Previously diagnosed with sleep apnea.  Patient declines repeat sleep study.  Says that she will not use a CPAP or inspire device.  Says she has minimal symptoms.  Patient education given on sleep apnea.     Roena Clark, NP 04/16/2024

## 2024-04-16 NOTE — Assessment & Plan Note (Addendum)
 Previously diagnosed with sleep apnea.  Patient declines repeat sleep study.  Says that she will not use a CPAP or inspire device.  Says she has minimal symptoms.  Patient education given on sleep apnea.

## 2024-04-16 NOTE — Assessment & Plan Note (Signed)
Appears under good control.  Continue on current regimen

## 2024-04-18 ENCOUNTER — Encounter: Payer: Self-pay | Admitting: Pharmacist

## 2024-04-18 NOTE — Progress Notes (Signed)
 Pharmacy Quality Measure Review  This patient is appearing on a report for being at risk of failing the adherence measure for diabetes medications this calendar year.   Medication: Mounjaro 5 mg Last fill date: 02/16/24 for 28 day supply  Insurance report was not up to date. No action needed at this time.  Medication has been refilled as of 03/21/24. Several refills remaining.

## 2024-04-25 ENCOUNTER — Encounter: Payer: Self-pay | Admitting: Pharmacist

## 2024-04-25 NOTE — Progress Notes (Addendum)
 Pharmacy Quality Measure Review  This patient is appearing on a report for being at risk of failing the adherence measure for diabetes medications this calendar year.   Medication: Mounjaro  5 mg Last fill date: 03/21/24 for 28 day supply  Contacted pharmacy to facilitate refills. Pharmacy automated phone service down.  6/16: Contacted pharmacy again. Refill initiated.

## 2024-05-23 ENCOUNTER — Other Ambulatory Visit: Payer: Self-pay | Admitting: Family Medicine

## 2024-05-23 DIAGNOSIS — Z1231 Encounter for screening mammogram for malignant neoplasm of breast: Secondary | ICD-10-CM

## 2024-06-07 ENCOUNTER — Encounter: Payer: Self-pay | Admitting: Pharmacist

## 2024-06-07 NOTE — Progress Notes (Signed)
 Pharmacy Quality Measure Review  This patient is appearing on a report for being at risk of failing the adherence measure for diabetes medications this calendar year.   Medication: Mounjaro  5 mg Last fill date: 04/29/24 for 28 day supply  Contacted pharmacy to facilitate refills. Rx#: [id number] Refill will be ready 06/08/24.

## 2024-06-12 DIAGNOSIS — M65342 Trigger finger, left ring finger: Secondary | ICD-10-CM | POA: Diagnosis not present

## 2024-06-18 ENCOUNTER — Ambulatory Visit
Admission: RE | Admit: 2024-06-18 | Discharge: 2024-06-18 | Disposition: A | Discharge status: Home / Self Care | Source: Ambulatory Visit | Attending: Family Medicine | Admitting: Family Medicine

## 2024-06-18 DIAGNOSIS — Z1231 Encounter for screening mammogram for malignant neoplasm of breast: Secondary | ICD-10-CM | POA: Diagnosis not present

## 2024-06-20 ENCOUNTER — Ambulatory Visit: Payer: Self-pay | Admitting: Family Medicine

## 2024-06-20 NOTE — Progress Notes (Signed)
 No critical labs need to be addressed urgently. We will discuss labs in detail at upcoming office visit.

## 2024-07-03 ENCOUNTER — Telehealth: Payer: Self-pay | Admitting: *Deleted

## 2024-07-03 NOTE — Telephone Encounter (Signed)
 Appointment with Dr. Avelina on 07/11/2024.

## 2024-07-03 NOTE — Telephone Encounter (Signed)
 Noted

## 2024-07-03 NOTE — Telephone Encounter (Signed)
 Copied from CRM [id number]. Topic: General - Other >> Jul 03, 2024  8:41 AM Henretta I wrote: Reason for CRM: Patient is currently in [place] but had a fall, she wants to see if she can get a chest x-ray as her shoulder and chest is still a little sore. Patient was not seen at hospital and states she knows nothing is broken, I did offer NT but patient declined and just wanted appt. Appt was scheduled for Thursday 8/28 at 8:40 as patient will not be back until Tuesday but has work Wednesday so only Thursday worked for here to be seen.

## 2024-07-11 ENCOUNTER — Ambulatory Visit: Admitting: Family Medicine

## 2024-08-16 ENCOUNTER — Telehealth: Payer: Self-pay | Admitting: Family Medicine

## 2024-08-16 NOTE — Telephone Encounter (Signed)
 Copied from CRM [id number]. Topic: Clinical - Medication Question >> Aug 16, 2024 10:55 AM Viola F wrote: Patient wants to let Dr. Avelina know that she would like to increase the MOUNJARO  from .5MG  to 1MG  and wants it sent to the [facility] on file. Please call her with an update at  [phone] (H)

## 2024-08-16 NOTE — Telephone Encounter (Signed)
 Left message for Carrie Perkins to return call to office.  Need to find out if she had any side effects on the current dose and we also need her current weight.

## 2024-08-19 ENCOUNTER — Encounter

## 2024-08-19 NOTE — Telephone Encounter (Signed)
 Spoke with Carrie Perkins.  She states the only issue she has had is itching at the injection site but she uses alcohol on that which helps.   Her current weight is 202 lbs.  She only wants to do one month supply at a time on her prescription.

## 2024-08-20 MED ORDER — TIRZEPATIDE 7.5 MG/0.5ML ~~LOC~~ SOAJ: 7.5000 mg | SUBCUTANEOUS | 3 refills | Status: AC

## 2024-08-20 NOTE — Telephone Encounter (Signed)
 Next dose up of Mounjaro  7.5 mg.  I have sent the prescription for the next month to her pharmacy with refills.

## 2024-08-20 NOTE — Addendum Note (Signed)
 Addended by: AVELINA NO E on: 08/20/2024 05:31 PM   Modules accepted: Orders

## 2024-09-22 ENCOUNTER — Telehealth: Payer: Self-pay | Admitting: Family Medicine

## 2024-09-22 DIAGNOSIS — E1169 Type 2 diabetes mellitus with other specified complication: Secondary | ICD-10-CM

## 2024-09-22 NOTE — Telephone Encounter (Signed)
-----   Message from Veva JINNY Ferrari sent at 09/17/2024 10:54 AM EST ----- Regarding: Lab orders for [facility], 11.13.25 Patient is scheduled for CPX labs, please order future labs, Thanks , Veva

## 2024-09-26 ENCOUNTER — Other Ambulatory Visit

## 2024-09-26 ENCOUNTER — Ambulatory Visit: Payer: Self-pay | Admitting: Family Medicine

## 2024-09-26 DIAGNOSIS — E1169 Type 2 diabetes mellitus with other specified complication: Secondary | ICD-10-CM

## 2024-09-26 LAB — COMPREHENSIVE METABOLIC PANEL WITH GFR
ALT: 20 U/L (ref 0–35)
AST: 19 U/L (ref 0–37)
Albumin: 4.1 g/dL (ref 3.5–5.2)
Alkaline Phosphatase: 60 U/L (ref 39–117)
BUN: 11 mg/dL (ref 6–23)
CO2: 27 meq/L (ref 19–32)
Calcium: 9.3 mg/dL (ref 8.4–10.5)
Chloride: 105 meq/L (ref 96–112)
Creatinine, Ser: 0.66 mg/dL (ref 0.40–1.20)
GFR: 85.99 mL/min (ref >60.00)
Glucose, Bld: 127 mg/dL — ABNORMAL HIGH (ref 70–99)
Potassium: 3.9 meq/L (ref 3.5–5.1)
Sodium: 140 meq/L (ref 135–145)
Total Bilirubin: 0.7 mg/dL (ref 0.2–1.2)
Total Protein: 6.7 g/dL (ref 6.0–8.3)

## 2024-09-26 LAB — LIPID PANEL
Cholesterol: 182 mg/dL (ref 0–200)
HDL: 47.8 mg/dL (ref >39.00)
LDL Cholesterol: 111 mg/dL — ABNORMAL HIGH (ref 0–99)
NonHDL: 133.77
Total CHOL/HDL Ratio: 4
Triglycerides: 115 mg/dL (ref 0.0–149.0)
VLDL: 23 mg/dL (ref 0.0–40.0)

## 2024-09-26 LAB — HEMOGLOBIN A1C: Hgb A1c MFr Bld: 6.1 % (ref 4.6–6.5)

## 2024-09-26 NOTE — Progress Notes (Signed)
 No critical labs need to be addressed urgently. We will discuss labs in detail at upcoming office visit.

## 2024-09-27 DIAGNOSIS — H25813 Combined forms of age-related cataract, bilateral: Secondary | ICD-10-CM | POA: Diagnosis not present

## 2024-09-27 DIAGNOSIS — E119 Type 2 diabetes mellitus without complications: Secondary | ICD-10-CM | POA: Diagnosis not present

## 2024-09-27 LAB — HM DIABETES EYE EXAM

## 2024-10-03 ENCOUNTER — Ambulatory Visit: Admitting: Family Medicine

## 2024-10-03 ENCOUNTER — Encounter: Payer: Self-pay | Admitting: Family Medicine

## 2024-10-03 VITALS — BP 110/60 | HR 82 | Temp 97.9°F | Ht 64.5 in | Wt 203.0 lb

## 2024-10-03 DIAGNOSIS — J4540 Moderate persistent asthma, uncomplicated: Secondary | ICD-10-CM

## 2024-10-03 DIAGNOSIS — Z23 Encounter for immunization: Secondary | ICD-10-CM | POA: Diagnosis not present

## 2024-10-03 DIAGNOSIS — G4733 Obstructive sleep apnea (adult) (pediatric): Secondary | ICD-10-CM

## 2024-10-03 DIAGNOSIS — Z6835 Body mass index (BMI) 35.0-35.9, adult: Secondary | ICD-10-CM

## 2024-10-03 DIAGNOSIS — Z0000 Encounter for general adult medical examination without abnormal findings: Secondary | ICD-10-CM | POA: Diagnosis not present

## 2024-10-03 DIAGNOSIS — E1169 Type 2 diabetes mellitus with other specified complication: Secondary | ICD-10-CM | POA: Diagnosis not present

## 2024-10-03 DIAGNOSIS — E785 Hyperlipidemia, unspecified: Secondary | ICD-10-CM

## 2024-10-03 DIAGNOSIS — E66812 Obesity, class 2: Secondary | ICD-10-CM

## 2024-10-03 DIAGNOSIS — E661 Drug-induced obesity: Secondary | ICD-10-CM

## 2024-10-03 DIAGNOSIS — I5032 Chronic diastolic (congestive) heart failure: Secondary | ICD-10-CM

## 2024-10-03 NOTE — Assessment & Plan Note (Addendum)
 Encouraged exercise, weight loss, healthy eating habits. ON Mounjaro  7.5 mg weekly... if no significant weight loss in next 3 months will increase dose.

## 2024-10-03 NOTE — Assessment & Plan Note (Signed)
Noted on ECHO in 2015 by Dr. Lenna Gilford.. referred to cardiology.  Repeat in  last 3 years per pt... was normal  ECHO at cardiology per pt.

## 2024-10-03 NOTE — Assessment & Plan Note (Addendum)
 Chronic,  persistent asthma appears to be under good control.    On Symbicort  160/4.5 mcg/ACT 2 puffs twice daily. Allergy control with azelastine  nasal spray, Qnasl  nasal spray and Mucinex DM.

## 2024-10-03 NOTE — Assessment & Plan Note (Addendum)
 Chronic,  not at goal LDL < 100  any longer but has stopped red yeast rice to 600 mg p.o. twice daily.  She continues to strongly decline statin medication despite need.   Continue healthy lifestyle changes.

## 2024-10-03 NOTE — Assessment & Plan Note (Signed)
 Chronic.. Significant improvement in control..    On Mounjaro  to 7.5 mg weekly. Offered incraese in dose for more weight management.. she will add back exercsie and let me know if she decides to increase dose   Re-eval in 6 months with A1C.

## 2024-10-03 NOTE — Assessment & Plan Note (Signed)
 Previously diagnosed with sleep apnea.  Patient declines repeat sleep study.  Says that she will not use a CPAP or inspire device.  Says she has minimal symptoms.  Patient education given on sleep apnea.

## 2024-10-03 NOTE — Progress Notes (Signed)
 Patient ID: Carrie Perkins, female    DOB: 09/01/1951, 73 y.o.   MRN: [id number]  This visit was conducted in person.  BP 110/60   Pulse 82   Temp 97.9 F (36.6 C) (Temporal)   Ht 5' 4.5 (1.638 m)   Wt 203 lb (92.1 kg)   SpO2 95%   BMI 34.31 kg/m    CC:  Chief Complaint  Patient presents with   Medicare Wellness    Subjective:   HPI: Carrie Perkins is a 73 y.o. female presenting on 10/03/2024 for Medicare Wellness  The patient presents for annual medicare wellness, complete physical and review of chronic health problems. He/She also has the following acute concerns today:   I have personally reviewed the Medicare Annual Wellness questionnaire and have noted 1. The patient's medical and social history 2. Their use of alcohol, tobacco or illicit drugs 3. Their current medications and supplements 4. The patient's functional ability including ADL's, fall risks, home safety risks and hearing or visual             impairment. 5. Diet and physical activities 6. Evidence for depression or mood disorders 7.         Updated provider list Cognitive evaluation was performed and recorded on pt medicare questionnaire form. The patients weight, height, BMI and visual acuity have been recorded in the chart   I have made referrals, counseling and provided education to the patient based review of the above and I have provided the pt with a written personalized care plan for preventive services.    Documentation of this information was scanned into the electronic record under the media tab.   Advance directives and end of life planning reviewed in detail with patient and documented in EMR. Patient given handout on advance care directives if needed. HCPOA and living will updated if needed.  Hearing Screening - Comments:: Wears Bilateral Hearing Aides  No falls in last 12 months.   Diabetes, well controlled with  Mounjaro  7.5 mg weekly Lab Results  Component Value Date   HGBA1C 6.1  09/26/2024  Started on metformin  500 mg daily with up titration to 1000 mg twice daily with food... has SE so stopped. Referral to diabetes education  completed  No longer eating out.  No SE. No diarrhea, no abdominal pain.  No low blood sugars.  minimal current exercise. Wt Readings from Last 3 Encounters:  10/03/24 203 lb (92.1 kg)  04/16/24 202 lb 3.2 oz (91.7 kg)  04/02/24 201 lb 6 oz (91.3 kg)     She limits carbs, Mounjaro  has decreased her appetite.   Hyperlipidemia, not on statin.  LDL  NOW at goal less than 100 on red yeast rice 600 mg BID. Lab Results  Component Value Date   CHOL 182 09/26/2024   HDL 47.80 09/26/2024   LDLCALC 111 (H) 09/26/2024   LDLDIRECT 140.0 07/06/2021   TRIG 115.0 09/26/2024   CHOLHDL 4 09/26/2024   Has seen Dr. Roll[facility] 12/06/21 for abnormal EKG, Per that note.. nml ECHO and nuclear stress test normal.  Relevant past medical, surgical, family and social history reviewed and updated as indicated. Interim medical history since our last visit reviewed. Allergies and medications reviewed and updated. Outpatient Medications Prior to Visit  Medication Sig Dispense Refill   albuterol  (VENTOLIN  HFA) 108 (90 Base) MCG/ACT inhaler INHALE 1-2 PUFFS BY MOUTH EVERY 6 HOURS AS NEEDED FOR WHEEZE OR SHORTNESS OF BREATH 1 each 5   aspirin  EC 81 MG tablet Take 81 mg by mouth daily.     Azelastine  HCl 137 MCG/SPRAY SOLN PLACE 1 SPRAY INTO BOTH NOSTRILS 2 (TWO) TIMES DAILY. USE IN EACH NOSTRIL AS DIRECTED 30 mL 5   Beclomethasone Dipropionate  (QNASL ) 80 MCG/ACT AERS Place 2 each into the nose at bedtime as needed.     blood glucose meter kit and supplies Dispense based on patient and insurance preference. Use up to four times daily as directed. (FOR ICD-10 E11.9). 1 each 0   budesonide -formoterol  (SYMBICORT ) 160-4.5 MCG/ACT inhaler Inhale 2 puffs into the lungs 2 (two) times daily. 1 each 12   cetirizine (ZYRTEC) 10 MG chewable tablet Chew 10 mg by mouth  2 (two) times daily.     Cholecalciferol 1000 UNITS capsule Take 1,000 Units by mouth daily.     dextromethorphan-guaiFENesin (MUCINEX DM) 30-600 MG per 12 hr tablet Take 2 tablets by mouth every 12 (twelve) hours.     Glucose Blood (BLOOD GLUCOSE TEST STRIPS) STRP Test blood sugar as directed. (FOR ICD-10 E11.9). 100 strip 3   Microlet Lancets MISC Use to test blood sugar as directed. (FOR ICD-10 E11.9). 100 each 3   NON FORMULARY doterra essential oil Mixes in water     Red Yeast Rice 600 MG CAPS Take by mouth.     tirzepatide  (MOUNJARO ) 7.5 MG/0.5ML Pen Inject 7.5 mg into the skin once a week. 6 mL 3   Turmeric (QC TUMERIC COMPLEX PO) Take 1 tablet by mouth daily.     Zinc Sulfate (ZINC 15 PO) Take 1 tablet by mouth daily. Not sure of the dose     No facility-administered medications prior to visit.     Per HPI unless specifically indicated in ROS section below Review of Systems  Constitutional:  Negative for fatigue and fever.  HENT:  Negative for congestion.   Eyes:  Negative for pain.  Respiratory:  Negative for cough, chest tightness, shortness of breath and wheezing.   Cardiovascular:  Negative for chest pain, palpitations and leg swelling.  Gastrointestinal:  Negative for abdominal pain.  Genitourinary:  Negative for dysuria and vaginal bleeding.  Musculoskeletal:  Negative for back pain.  Neurological:  Negative for syncope, light-headedness and headaches.  Psychiatric/Behavioral:  Negative for dysphoric mood.    Objective:  BP 110/60   Pulse 82   Temp 97.9 F (36.6 C) (Temporal)   Ht 5' 4.5 (1.638 m)   Wt 203 lb (92.1 kg)   SpO2 95%   BMI 34.31 kg/m   Wt Readings from Last 3 Encounters:  10/03/24 203 lb (92.1 kg)  04/16/24 202 lb 3.2 oz (91.7 kg)  04/02/24 201 lb 6 oz (91.3 kg)      Physical Exam Constitutional:      General: She is not in acute distress.    Appearance: Normal appearance. She is well-developed. She is not ill-appearing or toxic-appearing.   HENT:     Head: Normocephalic.     Right Ear: Hearing, tympanic membrane, ear canal and external ear normal. Tympanic membrane is not erythematous, retracted or bulging.     Left Ear: Hearing, tympanic membrane, ear canal and external ear normal. Tympanic membrane is not erythematous, retracted or bulging.     Nose: No mucosal edema or rhinorrhea.     Right Sinus: No maxillary sinus tenderness or frontal sinus tenderness.     Left Sinus: No maxillary sinus tenderness or frontal sinus tenderness.     Mouth/Throat:     Pharynx: Uvula  midline.  Eyes:     General: Lids are normal. Lids are everted, no foreign bodies appreciated.     Conjunctiva/sclera: Conjunctivae normal.     Pupils: Pupils are equal, round, and reactive to light.  Neck:     Thyroid : No thyroid  mass or thyromegaly.     Vascular: No carotid bruit.     Trachea: Trachea normal.  Cardiovascular:     Rate and Rhythm: Normal rate and regular rhythm.     Pulses: Normal pulses.     Heart sounds: Normal heart sounds, S1 normal and S2 normal. No murmur heard.    No friction rub. No gallop.  Pulmonary:     Effort: Pulmonary effort is normal. No tachypnea or respiratory distress.     Breath sounds: Decreased breath sounds and wheezing present. No rhonchi or rales.     Comments: Scattered Abdominal:     General: Bowel sounds are normal.     Palpations: Abdomen is soft.     Tenderness: There is no abdominal tenderness.  Musculoskeletal:     Cervical back: Normal range of motion and neck supple.  Skin:    General: Skin is warm and dry.     Findings: No rash.  Neurological:     Mental Status: She is alert.  Psychiatric:        Mood and Affect: Mood is not anxious or depressed.        Speech: Speech normal.        Behavior: Behavior normal. Behavior is cooperative.        Thought Content: Thought content normal.        Judgment: Judgment normal.       Results for orders placed or performed in visit on 10/03/24  HM  DIABETES EYE EXAM   Collection Time: 09/27/24 12:00 AM  Result Value Ref Range   HM Diabetic Eye Exam No Retinopathy No Retinopathy    Assessment and Plan  The patient's preventative maintenance and recommended screening tests for an annual wellness exam were reviewed in full today. Brought up to date unless services declined.  Counselled on the importance of diet, exercise, and its role in overall health and mortality. The patient's FH and SH was reviewed, including their home life, tobacco status, and drug and alcohol status.   Vaccines: Up-to-date with flu vaccine, pneumococcal vaccine, consider COVID,RSV, Shingrix and tetanus at pharmacy. Pap/DVE: Not indicated given age Mammo: 06/18/2024 repeat yearly to every 2 years Bone Density: July 02, 2023 osteopenia, repeat in 2 to 5 years Colon:  12/09/2019 recall 5 years. smoking Status: None ETOH/ drug use: None/none  Hep C: Done   Hyperlipidemia associated with type 2 diabetes mellitus (HCC) Assessment & Plan: Chronic,  not at goal LDL < 100 despite increase in red yeast rice to 600 mg p.o. twice daily.  She continues to strongly decline statin medication despite need.   Continue healthy lifestyle changes.   Need for influenza vaccination -     Flu vaccine HIGH DOSE PF(Fluzone Trivalent)  Class 2 drug-induced obesity with serious comorbidity and body mass index (BMI) of 35.0 to 35.9 in adult Assessment & Plan: Encouraged exercise, weight loss, healthy eating habits. ON Mounjaro  7.5 mg weekly.   Type 2 diabetes mellitus with other specified complication, without long-term current use of insulin (HCC) Assessment & Plan:  Chronic.. Significant improvement in control..    On Mounjaro  to 7.5 mg weekly. Offered incraese in dose for more weight management.. she will add back exercsie  and let me know if she decides to increase dose   Re-eval in 6 months with A1C.   Chronic heart failure with preserved ejection fraction (HFpEF)  [facility]) Assessment & Plan: Noted on ECHO in 2015 by Dr. Christi.. referred to cardiology.  Repeat in  last 3 years per pt... was normal  ECHO at cardiology per pt.    Moderate persistent asthma, unspecified whether complicated Assessment & Plan: Chronic,  persistent asthma appears to be under good control.    On Symbicort  160/4.5 mcg/ACT 2 puffs twice daily. Allergy control with azelastine  nasal spray, Qnasl  nasal spray and Mucinex DM.    OSA (obstructive sleep apnea) Assessment & Plan: Previously diagnosed with sleep apnea.  Patient declines repeat sleep study.  Says that she will not use a CPAP or inspire device.  Says she has minimal symptoms.  Patient education given on sleep apnea.      No follow-ups on file.   Greig Ring, MD
